# Patient Record
Sex: Female | Born: 1935 | Race: White | Hispanic: No | Smoking: Never smoker
Health system: Southern US, Community
[De-identification: ages and names within clinical notes are randomized; demographics above are authoritative.]

## PROBLEM LIST (undated history)

## (undated) DIAGNOSIS — E7800 Pure hypercholesterolemia, unspecified: Secondary | ICD-10-CM

## (undated) DIAGNOSIS — B958 Unspecified staphylococcus as the cause of diseases classified elsewhere: Secondary | ICD-10-CM

## (undated) DIAGNOSIS — N183 Chronic kidney disease, stage 3 unspecified: Secondary | ICD-10-CM

## (undated) DIAGNOSIS — N289 Disorder of kidney and ureter, unspecified: Secondary | ICD-10-CM

## (undated) DIAGNOSIS — K922 Gastrointestinal hemorrhage, unspecified: Secondary | ICD-10-CM

## (undated) DIAGNOSIS — I10 Essential (primary) hypertension: Secondary | ICD-10-CM

## (undated) DIAGNOSIS — M4800 Spinal stenosis, site unspecified: Secondary | ICD-10-CM

## (undated) DIAGNOSIS — I341 Nonrheumatic mitral (valve) prolapse: Secondary | ICD-10-CM

## (undated) DIAGNOSIS — F0390 Unspecified dementia without behavioral disturbance: Secondary | ICD-10-CM

## (undated) DIAGNOSIS — K219 Gastro-esophageal reflux disease without esophagitis: Secondary | ICD-10-CM

## (undated) HISTORY — PX: CATARACT EXTRACTION: SUR2

## (undated) HISTORY — DX: Nonrheumatic mitral (valve) prolapse: I34.1

## (undated) HISTORY — PX: OTHER SURGICAL HISTORY: SHX169

## (undated) HISTORY — DX: Gastrointestinal hemorrhage, unspecified: K92.2

## (undated) HISTORY — DX: Gastro-esophageal reflux disease without esophagitis: K21.9

## (undated) HISTORY — DX: Essential (primary) hypertension: I10

## (undated) HISTORY — DX: Chronic kidney disease, stage 3 (moderate): N18.3

## (undated) HISTORY — DX: Pure hypercholesterolemia, unspecified: E78.00

## (undated) HISTORY — DX: Spinal stenosis, site unspecified: M48.00

## (undated) HISTORY — DX: Chronic kidney disease, stage 3 unspecified: N18.30

## (undated) HISTORY — PX: ABDOMINAL HYSTERECTOMY: SHX81

## (undated) HISTORY — DX: Unspecified dementia, unspecified severity, without behavioral disturbance, psychotic disturbance, mood disturbance, and anxiety: F03.90

## (undated) HISTORY — DX: Unspecified staphylococcus as the cause of diseases classified elsewhere: B95.8

## (undated) HISTORY — PX: ABCESS DRAINAGE: SHX399

## (undated) HISTORY — PX: TOTAL KNEE ARTHROPLASTY: SHX125

---

## 1998-05-31 ENCOUNTER — Encounter: Admission: RE | Admit: 1998-05-31 | Discharge: 1998-07-14 | Payer: Self-pay | Admitting: Rheumatology

## 1998-10-12 ENCOUNTER — Ambulatory Visit (HOSPITAL_COMMUNITY): Admission: RE | Admit: 1998-10-12 | Discharge: 1998-10-12 | Payer: Self-pay | Admitting: Rheumatology

## 1998-10-12 ENCOUNTER — Encounter: Payer: Self-pay | Admitting: Rheumatology

## 1999-02-07 ENCOUNTER — Encounter: Admission: RE | Admit: 1999-02-07 | Discharge: 1999-02-07 | Payer: Self-pay | Admitting: Rheumatology

## 1999-02-07 ENCOUNTER — Encounter: Payer: Self-pay | Admitting: Rheumatology

## 1999-02-24 ENCOUNTER — Encounter: Payer: Self-pay | Admitting: Rheumatology

## 1999-02-24 ENCOUNTER — Ambulatory Visit (HOSPITAL_COMMUNITY): Admission: RE | Admit: 1999-02-24 | Discharge: 1999-02-24 | Payer: Self-pay | Admitting: Rheumatology

## 1999-03-30 ENCOUNTER — Encounter: Payer: Self-pay | Admitting: Rheumatology

## 1999-03-30 ENCOUNTER — Ambulatory Visit (HOSPITAL_COMMUNITY): Admission: RE | Admit: 1999-03-30 | Discharge: 1999-03-30 | Payer: Self-pay | Admitting: Rheumatology

## 1999-05-26 ENCOUNTER — Encounter: Admission: RE | Admit: 1999-05-26 | Discharge: 1999-05-26 | Payer: Self-pay | Admitting: Internal Medicine

## 1999-05-26 ENCOUNTER — Encounter: Payer: Self-pay | Admitting: Internal Medicine

## 1999-05-31 ENCOUNTER — Encounter: Payer: Self-pay | Admitting: Internal Medicine

## 1999-05-31 ENCOUNTER — Encounter: Admission: RE | Admit: 1999-05-31 | Discharge: 1999-05-31 | Payer: Self-pay | Admitting: Internal Medicine

## 1999-08-24 ENCOUNTER — Encounter: Payer: Self-pay | Admitting: Orthopedic Surgery

## 1999-08-28 ENCOUNTER — Encounter: Payer: Self-pay | Admitting: Orthopedic Surgery

## 1999-08-28 ENCOUNTER — Inpatient Hospital Stay (HOSPITAL_COMMUNITY): Admission: RE | Admit: 1999-08-28 | Discharge: 1999-08-31 | Payer: Self-pay | Admitting: Orthopedic Surgery

## 1999-11-01 ENCOUNTER — Encounter: Payer: Self-pay | Admitting: Orthopedic Surgery

## 1999-11-01 ENCOUNTER — Inpatient Hospital Stay (HOSPITAL_COMMUNITY): Admission: RE | Admit: 1999-11-01 | Discharge: 1999-11-05 | Payer: Self-pay | Admitting: Orthopedic Surgery

## 1999-11-28 ENCOUNTER — Encounter: Admission: RE | Admit: 1999-11-28 | Discharge: 1999-11-28 | Payer: Self-pay | Admitting: Family Medicine

## 1999-11-28 ENCOUNTER — Encounter: Payer: Self-pay | Admitting: Family Medicine

## 1999-12-12 ENCOUNTER — Encounter: Payer: Self-pay | Admitting: Orthopedic Surgery

## 1999-12-12 ENCOUNTER — Inpatient Hospital Stay (HOSPITAL_COMMUNITY): Admission: RE | Admit: 1999-12-12 | Discharge: 1999-12-14 | Payer: Self-pay | Admitting: Orthopedic Surgery

## 2000-01-02 ENCOUNTER — Ambulatory Visit (HOSPITAL_BASED_OUTPATIENT_CLINIC_OR_DEPARTMENT_OTHER): Admission: RE | Admit: 2000-01-02 | Discharge: 2000-01-02 | Payer: Self-pay | Admitting: Orthopedic Surgery

## 2000-04-12 ENCOUNTER — Other Ambulatory Visit: Admission: RE | Admit: 2000-04-12 | Discharge: 2000-04-12 | Payer: Self-pay | Admitting: Obstetrics and Gynecology

## 2000-04-15 ENCOUNTER — Other Ambulatory Visit: Admission: RE | Admit: 2000-04-15 | Discharge: 2000-04-15 | Payer: Self-pay | Admitting: Obstetrics and Gynecology

## 2000-04-15 ENCOUNTER — Encounter (INDEPENDENT_AMBULATORY_CARE_PROVIDER_SITE_OTHER): Payer: Self-pay

## 2000-05-08 ENCOUNTER — Other Ambulatory Visit: Admission: RE | Admit: 2000-05-08 | Discharge: 2000-05-08 | Payer: Self-pay | Admitting: Obstetrics and Gynecology

## 2000-05-08 ENCOUNTER — Encounter (INDEPENDENT_AMBULATORY_CARE_PROVIDER_SITE_OTHER): Payer: Self-pay | Admitting: Specialist

## 2000-07-01 ENCOUNTER — Encounter (INDEPENDENT_AMBULATORY_CARE_PROVIDER_SITE_OTHER): Payer: Self-pay | Admitting: Specialist

## 2000-07-01 ENCOUNTER — Ambulatory Visit (HOSPITAL_COMMUNITY): Admission: RE | Admit: 2000-07-01 | Discharge: 2000-07-01 | Payer: Self-pay | Admitting: Obstetrics and Gynecology

## 2001-04-30 ENCOUNTER — Ambulatory Visit (HOSPITAL_COMMUNITY): Admission: RE | Admit: 2001-04-30 | Discharge: 2001-04-30 | Payer: Self-pay | Admitting: Internal Medicine

## 2001-04-30 HISTORY — PX: COLONOSCOPY: SHX174

## 2001-05-28 ENCOUNTER — Other Ambulatory Visit: Admission: RE | Admit: 2001-05-28 | Discharge: 2001-05-28 | Payer: Self-pay | Admitting: Obstetrics and Gynecology

## 2002-10-21 ENCOUNTER — Encounter: Payer: Self-pay | Admitting: Family Medicine

## 2002-10-21 ENCOUNTER — Encounter: Admission: RE | Admit: 2002-10-21 | Discharge: 2002-10-21 | Payer: Self-pay | Admitting: Family Medicine

## 2003-12-27 ENCOUNTER — Encounter: Admission: RE | Admit: 2003-12-27 | Discharge: 2003-12-27 | Payer: Self-pay | Admitting: Family Medicine

## 2006-03-25 ENCOUNTER — Inpatient Hospital Stay (HOSPITAL_COMMUNITY): Admission: RE | Admit: 2006-03-25 | Discharge: 2006-03-28 | Payer: Self-pay | Admitting: Orthopedic Surgery

## 2007-02-28 ENCOUNTER — Ambulatory Visit: Payer: Self-pay | Admitting: Urgent Care

## 2007-02-28 ENCOUNTER — Inpatient Hospital Stay (HOSPITAL_COMMUNITY): Admission: AD | Admit: 2007-02-28 | Discharge: 2007-03-02 | Payer: Self-pay | Admitting: Gastroenterology

## 2007-03-01 ENCOUNTER — Encounter: Payer: Self-pay | Admitting: Gastroenterology

## 2007-03-01 ENCOUNTER — Ambulatory Visit: Payer: Self-pay | Admitting: Gastroenterology

## 2007-03-01 HISTORY — PX: ESOPHAGOGASTRODUODENOSCOPY: SHX1529

## 2007-03-02 ENCOUNTER — Ambulatory Visit: Payer: Self-pay | Admitting: Gastroenterology

## 2007-03-02 HISTORY — PX: COLONOSCOPY: SHX174

## 2007-04-29 ENCOUNTER — Ambulatory Visit: Payer: Self-pay | Admitting: Gastroenterology

## 2007-06-10 ENCOUNTER — Ambulatory Visit (HOSPITAL_COMMUNITY): Admission: RE | Admit: 2007-06-10 | Discharge: 2007-06-10 | Payer: Self-pay | Admitting: Gastroenterology

## 2007-08-04 ENCOUNTER — Ambulatory Visit: Payer: Self-pay | Admitting: Gastroenterology

## 2007-08-14 ENCOUNTER — Emergency Department (HOSPITAL_COMMUNITY): Admission: EM | Admit: 2007-08-14 | Discharge: 2007-08-14 | Payer: Self-pay | Admitting: Emergency Medicine

## 2007-09-02 ENCOUNTER — Emergency Department (HOSPITAL_COMMUNITY): Admission: EM | Admit: 2007-09-02 | Discharge: 2007-09-02 | Payer: Self-pay | Admitting: Emergency Medicine

## 2008-01-30 ENCOUNTER — Ambulatory Visit: Payer: Self-pay | Admitting: Internal Medicine

## 2008-07-02 ENCOUNTER — Encounter (INDEPENDENT_AMBULATORY_CARE_PROVIDER_SITE_OTHER): Payer: Self-pay | Admitting: *Deleted

## 2008-08-13 ENCOUNTER — Ambulatory Visit: Payer: Self-pay | Admitting: Gastroenterology

## 2008-08-13 DIAGNOSIS — Z8719 Personal history of other diseases of the digestive system: Secondary | ICD-10-CM

## 2009-06-25 ENCOUNTER — Emergency Department (HOSPITAL_COMMUNITY): Admission: EM | Admit: 2009-06-25 | Discharge: 2009-06-25 | Payer: Self-pay | Admitting: Emergency Medicine

## 2009-07-18 ENCOUNTER — Encounter (INDEPENDENT_AMBULATORY_CARE_PROVIDER_SITE_OTHER): Payer: Self-pay | Admitting: *Deleted

## 2009-07-21 ENCOUNTER — Encounter (HOSPITAL_BASED_OUTPATIENT_CLINIC_OR_DEPARTMENT_OTHER): Admission: RE | Admit: 2009-07-21 | Discharge: 2009-09-16 | Payer: Self-pay | Admitting: Internal Medicine

## 2009-09-23 DIAGNOSIS — K219 Gastro-esophageal reflux disease without esophagitis: Secondary | ICD-10-CM

## 2009-09-23 DIAGNOSIS — N259 Disorder resulting from impaired renal tubular function, unspecified: Secondary | ICD-10-CM | POA: Insufficient documentation

## 2009-10-10 ENCOUNTER — Encounter: Admission: RE | Admit: 2009-10-10 | Discharge: 2009-10-10 | Payer: Self-pay | Admitting: Family Medicine

## 2009-10-19 ENCOUNTER — Ambulatory Visit: Payer: Self-pay | Admitting: Gastroenterology

## 2010-04-18 NOTE — Assessment & Plan Note (Signed)
Summary: FU IN ONE YEAR,PUD/SS   Allergies (verified): 1)  ! Demerol  Vital Signs:  Patient profile:   77 year old female Height:      63 inches Weight:      165 pounds BMI:     29.33 Temp:     98.7 degrees F oral Pulse rate:   72 / minute BP sitting:   122 / 78  (left arm) Cuff size:   regular  Vitals Entered By: Cloria Spring LPN (October 19, 2009 4:23 PM)

## 2010-04-18 NOTE — Letter (Signed)
Summary: Recall Office Visit  [facility]  [address]   [place], [place] [address]   Phone: [phone]  Fax: [phone]      Jul 18, 2009   [facility] [place] [address] [place], [place]  [address] [birth year]   Dear Ms. Fedele,   According to our records, it is time for you to schedule a follow-up office visit with [place].   At your convenience, please call [phone] to schedule an office visit. If you have any questions, concerns, or feel that this letter is in error, we would appreciate your call.   Sincerely,    Diana Eves  [facility] Ph: [phone]   Fax: [phone]

## 2010-04-18 NOTE — Assessment & Plan Note (Signed)
Summary: PUD   Visit Type:  Follow-up Visit Primary Care Provider:  Dr. Gilmore Laroche, M.D.  Chief Complaint:  PUD.  History of Present Illness: No questions or concerns. Rare taking baby ASA: 3 nights a week. No more Celebrex. No black stools, rectal bleeding, problems swallowing, nausea, vomiting, or abd pain. Lost 3 lbs since last year-trying.  Current Medications (verified): 1)  Lotrel 10-20 Mg Caps (Amlodipine Besy-Benazepril Hcl) .Marland Kitchen.. 1 By Mouth Qd 2)  Pravachol 20 Mg Tabs (Pravastatin Sodium) .Marland Kitchen.. 1 By Mouth Daily 3)  Omeprazole 20 Mg Cpdr (Omeprazole) .Marland Kitchen.. 1 By Mouth Qd 4)  Celexa 10 Mg Tabs (Citalopram Hydrobromide) .Marland Kitchen.. 1 By Mouth Daily 5)  Fish Oil  Oil (Fish Oil) .... 3 By Mouth Qd 6)  Calcium Plus D .... Daily 7)  Multi Vitamin .... Take 1 Tablet By Mouth Once A Day 8)  Vitamin D .... Once Daily  Allergies (verified): 1)  ! Demerol  Past History:  Past Medical History: Peptic Ulcer Disease on CLBX/ASA-12/08-EGD TCS: 12/08-no polyps Diverticulosis, [place] chronic constipation hemorrhoids GERD Hyperlipidemia Hypertension Chronic Kidney Disease: Cr 1.30 8/09  Mtral valve prolapse  Past Surgical History: No recent surgery.  Family History: No FH of Colon Cancer  Social History: Widowed. Lives with son. Retired Diplomatic Services operational officer  Review of Systems       2008: 158 lbs  JULY 2011 CR 1.36 ALB 4.4 NL HFP HB 13.8 PLT 332 CHOL 184 TRIG 86 TSH 3.550  Physical Exam  General:  Well developed, well nourished, no acute distress. Head:  Normocephalic and atraumatic. Lungs:  Clear throughout to auscultation. Heart:  Regular rate and rhythm; no murmurs. Abdomen:  Soft, nontender and nondistended. Normal bowel sounds. Extremities:  No edema noted.  Impression & Recommendations:  Problem # 1:  GASTROINTESTINAL HEMORRHAGE, HX OF (ICD-V12.79) On ASA TIWK. No active GIB. OPV in 24 mos.  CC: PCP  Other Orders: Est. Patient Level II (54098)  Appended Document: PUD 24  MONTH OPV IS IN THE COMPUTER

## 2010-05-16 ENCOUNTER — Emergency Department (HOSPITAL_COMMUNITY)
Admission: EM | Admit: 2010-05-16 | Discharge: 2010-05-16 | Disposition: A | Discharge status: Home / Self Care | Payer: Medicare Other | Attending: Emergency Medicine | Admitting: Emergency Medicine

## 2010-05-16 DIAGNOSIS — R059 Cough, unspecified: Secondary | ICD-10-CM | POA: Insufficient documentation

## 2010-05-16 DIAGNOSIS — R0602 Shortness of breath: Secondary | ICD-10-CM | POA: Insufficient documentation

## 2010-05-16 DIAGNOSIS — R05 Cough: Secondary | ICD-10-CM | POA: Insufficient documentation

## 2010-05-16 DIAGNOSIS — J45901 Unspecified asthma with (acute) exacerbation: Secondary | ICD-10-CM | POA: Insufficient documentation

## 2010-05-16 DIAGNOSIS — R0789 Other chest pain: Secondary | ICD-10-CM | POA: Insufficient documentation

## 2010-06-07 LAB — CBC
MCHC: 34.8 g/dL (ref 30.0–36.0)
MCV: 85.4 fL (ref 78.0–100.0)
WBC: 7.7 10*3/uL (ref 4.0–10.5)

## 2010-06-07 LAB — BASIC METABOLIC PANEL
CO2: 27 mEq/L (ref 19–32)
Calcium: 9.5 mg/dL (ref 8.4–10.5)
GFR calc non Af Amer: 42 mL/min — ABNORMAL LOW (ref >60)
Glucose, Bld: 107 mg/dL — ABNORMAL HIGH (ref 70–99)

## 2010-06-07 LAB — DIFFERENTIAL
Basophils Absolute: 0 10*3/uL (ref 0.0–0.1)
Basophils Relative: 0 % (ref 0–1)
Eosinophils Absolute: 0.5 10*3/uL (ref 0.0–0.7)

## 2010-08-01 NOTE — Assessment & Plan Note (Signed)
NAMEMarland Kitchen  [name], [name]               CHART#:  [id number]   DATE:  04/29/2007                       DOB:  [birth year]   REFERRING PHYSICIAN:  [facility].   PROBLEM LIST:  1. Melena secondary to peptic ulcer disease, although definite disease      was not identified on upper endoscopy in December 2008.  2. Constipation.  3. Hypertension.  4. Hyperlipidemia.  5. Allergies.  6. Mitral valve prolapse.  7. Allergy to DEMEROL.  8. Sigmoid colon diverticulosis.  9. 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   SUBJECTIVE:  [name] is a 77 year old female who presents as a  return patient visit.  She has no questions, concerns, or complaints.  She had labs drawn a week ago.  She has been taking her omeprazole and  avoiding aspirin.  She takes her Celebrex sparingly.  She has never  had a history of stroke, heart attack, or tobacco use.  Her father had a  heart attack at 80 and died of a stroke at 46.  He smoked a pipe.  Her  mother died in 47 of old age.   MEDICATIONS:  1. Lotrel.  2. Singulair.  3. Allegra.  4. Glucosamine twice a day.  5. Multivitamin.  6. Celebrex as needed.  7. Metamucil.  8. Fish oil.  9. Pravastatin.  10.Meclizine as needed.  11.Omeprazole daily.  12.Iron twice a day.   OBJECTIVE:  VITAL SIGNS:  Weight 165 pounds (up 7 pounds since December  2008), height 5 feet 3 inches, BMI 29.2 (overweight), temperature 97.7,  blood pressure 128/80, pulse 88.GENERAL:  She is in no apparent  distress, alert and oriented x4.  LUNGS:  Clear to auscultation bilaterally.CARDIOVASCULAR:  Regular  rhythm.ABDOMEN:  Bowel sounds are present, soft, nontender,  nondistended.   ASSESSMENT:  [name] is a 77 year old female who had a drop in her  hemoglobin from 10 to 5, likely secondary to peptic ulcer disease.  She  has been off of aspirin for the last 2 months.  She has no evidence of  gastrointestinal bleed.   Thank you for  allowing me to see [name] in consultation.  My  recommendations follow.   RECOMMENDATIONS:  1. She may resume using Celebrex.  She is cautioned to use aspirin      Monday, Wednesday, Friday, adding aspirin to Celebrex negates the      GI protective effects.  2. She should continue omeprazole indefinitely.  3. We will check her hemoglobin results from [facility]      [facility] and if her hemoglobin is normal, then the iron will be      discontinued.  4. She should follow up with me in 3 months.       Kassie Mends, M.D.  Electronically Signed     SM/MEDQ  D:  04/30/2007  T:  05/01/2007  Job:  [id number]   cc:   Ernestina Penna, M.D.

## 2010-08-01 NOTE — Consult Note (Signed)
NAMECRYSTLE, [name]              ACCOUNT NO.:  [id number]   MEDICAL RECORD NO.:  [id number]         PATIENT TYPE:  INP   LOCATION:  A323                          FACILITY:  [facility]   PHYSICIAN:  Kassie Mends, M.D.      DATE OF BIRTH:  [birth year]   DATE OF CONSULTATION:  DATE OF DISCHARGE:  03/02/2007                                 CONSULTATION   REFERRING PHYSICIAN:  Dr. Ernestina Penna.   REASON FOR CONSULTATION:  Melena.   HISTORY OF PRESENT ILLNESS:  [name] is a 77 year old Caucasian  female.  Approximately 1 week ago, she developed a large melenic stool.  She has complained of fatigue and weakness to the point where she is  having difficulty walking.  She denies any syncope, chest pain,  shortness of breath, or headaches.  She has had several melenic stools  over the past week.  She is having a bowel movement about every day or  every other day.  She denies any abdominal pain.  She has had some  heartburn and indigestion.  She complains of anorexia.  She does take  Nexium p.r.n., but has not been taking it on a regular basis.  She  denies any dysphagia or odynophagia.  She is on Celebrex and was  previously on a baby aspirin, although she stopped this when she started  bleeding.  She was seen at [facility].  She was found  to have a hemoglobin of 10.1 and was Hemoccult-positive.  She has a  hematocrit of 29.1, MCV of 83, a red blood cell count of 3.49, a white  blood cell count of 11.4 and platelets of 444,000.  She had a normal  TSH.  Creatinine was 1.38.   PAST MEDICAL AND SURGICAL HISTORY:  1. She has a history of chronic constipation.  On colonoscopy by Dr.      Jena Gauss, April 30, 2001, she was found to have internal      hemorrhoids, normal colon, normal terminal ileum.  2. She has history of hypertension, hypercholesterolemia, seasonal      allergies and arthritis and mitral valve prolapse.  3. She had bilateral hip replacements in 2001.  4.  She had a right knee replacement in January 2008.  5. At age 75, she had an appendectomy.  6. She had a precancerous skin lesion removed by Dr. Margo Aye earlier this      year.   CURRENT MEDICATIONS:  1. Lotrel 10 mg daily.  2. Singulair 10 mg p.r.n.  3. Allegra 180 mg p.r.n.  4. Biotin once daily.  5. Zinc once daily.  6. Vitamin B12 once daily.  7. Vitamin C 500 mg b.i.d.  8. Glucosamine b.i.d.  9. Multivitamin daily.  10.Celebrex 100 mg daily.  11.Calcium plus vitamin D 600 mg b.i.d.  12.Metamucil p.r.n.  13.Fish oil b.i.d.  14.Pravastatin once daily.  15.Meclizine 25 mg p.r.n.  16.Nexium 40 mg p.r.n.  17.Aspirin 81 mg nightly, which was recently discontinued.   ALLERGIES:  DEMEROL.   FAMILY HISTORY:  There is no known family history of colorectal  carcinoma, liver  or chronic GI problems.  Mother deceased at age 41 due  to old age.  Father deceased at age 4 with history of coronary artery  disease and CVA.  She has multiple siblings with history significant for  coronary artery disease, hypertension, aneurysm and neck cancer.   SOCIAL HISTORY:  [name] is widowed.  She lives with her son.  She  has 3 grown healthy children.  She is a retired [employer] Services operational officer.  She denies  any tobacco, alcohol or drug use.   REVIEW OF SYSTEMS:  See HPI, otherwise negative.   PHYSICAL EXAMINATION:  VITAL SIGNS:  Weight 158 pounds, height 62  inches.  Temperature 98.2, blood pressure 120/60 and pulse of 80.  GENERAL:  [name] is a pale-appearing Caucasian female who is  alert, oriented, pleasant and cooperative, in no acute distress.  HEENT:  Sclerae are clear, anicteric.  Conjunctivae pale.  Oropharynx  pink and moist without any lesions.  She has upper and lower dentures  intact.  NECK:  Supple without any mass or thyromegaly.  CHEST:  Heart:  Regular rate and rhythm.  She has a 2/6 murmur noted.  LUNGS:  Clear to auscultation bilaterally.  ABDOMEN:  Positive bowel sounds x4.  No  bruits auscultated.  Soft,  nontender and non-distended without palpable mass or hepatosplenomegaly.  No rebound tenderness or guarding.  BACK:  Noted and erythematous, scaled brown lesion in her mid back.  RECTAL:  She has a hypopigmented perineum with loss of normal  vasculature.  Internal exam is benign.  She has formed stool in the  vault, which is dark and Hemoccult-positive.  EXTREMITIES:  Without edema or clubbing bilaterally.   LABORATORY STUDIES:  Laboratory studies from [facility] on February 25, 2007 showed a BUN of 37, creatinine 1.38, sodium  137, potassium 4.2, chloride 100, CO2 24, calcium 9.6, total protein  6.2, albumin 4, total bilirubin 0.2, alkaline phosphatase 77, AST 22,  ALT 32.   IMPRESSION:  [name] is a 77 year old female with a 1-week history  of melena and gastrointestinal bleeding.  I suspect she may have peptic  ulcer disease.  She is at risk, given concomitant aspirin and Celebrex  use.  Other possibilities include small bowel etiology including  arteriovenous malformations or less likely would be colonic source.  She  does have an abnormal rectal exam and hypopigmented perineum and may  need rectal exam plus/minus colonoscopy, depending on  esophagogastroduodenoscopy findings.   PLAN:  1. EGD with Dr. Jena Gauss as soon as possible.  I have discussed this      procedure including risks and benefits including, but not limited      to, bleeding, infection, perforation and drug reaction; she agrees      to the plan and consent will be obtained.  2. STAT CBC.  The lab is to hold her and call me with the results.  3. She is instructed to go immediately to the emergency room if she      develops significant bleeding or weakness.   We would like to thank [facility] for allowing [place] to  participate in the care of Ms. Polivka.      Lorenza Burton, N.P.      Kassie Mends, M.D.  Electronically Signed    KJ/MEDQ   D:  02/28/2007  T:  02/28/2007  Job:  [id number]   cc:   [facility] Stacey Drain. NP   Ernestina Penna,  M.D.  Fax: [phone]

## 2010-08-01 NOTE — Op Note (Signed)
NAMELOWEN, MANSOURI              ACCOUNT NO.:  [id number]   MEDICAL RECORD NO.:  [id number]          PATIENT TYPE:  INP   LOCATION:  A323                          FACILITY:  [facility]   PHYSICIAN:  Kassie Mends, M.D.      DATE OF BIRTH:  04/13/1932   DATE OF PROCEDURE:  03/02/2007  DATE OF DISCHARGE:                               OPERATIVE REPORT   PROCEDURE:  Colonoscopy.   INDICATION FOR EXAM:  Ms. Brusseau is a 77 year old female who had  melena for one week.  She is on aspirin and Celebrex.  Her hemoglobin  dropped 10 to 5.2.  Her last colonoscopy was on February 2003.  Her  upper endoscopy did reveal edema and stricture of the D1-D2 junction  suggesting ulcer disease but no definite ulcer was identified.  The  colonoscopy is being performed to rule out a right-sided colonic lesion  as an etiology for her melena.   FINDINGS:  1. Many sigmoid colon diverticula.  Otherwise no polyps, masses,      inflammatory changes or arteriovenous malformations.  2. Normal terminal ileum.  Approximately 10-15 cm of the distal      terminal ileum visualized.  No old blood or fresh blood seen in the      distal terminal ileum for in the colon.  3. Normal retroflexed view of the rectum.   RECOMMENDATIONS:  1. No aspirin and NSAIDs for 30 days.  No anticoagulation for 7 days.  2. Will await the biopsies from the upper endoscopy.  3. She should have a follow-up appointment with me in two months.  May      consider restarting the aspirin after one month as long as she is      on a proton pump inhibitor.  4. She should continue proton pump inhibitor twice daily for two weeks      then once daily.  5. Nu-Iron 150 mg twice daily.   MEDICATIONS:  1. Fentanyl 50 mcg IV.  2. Versed 5 mg IV.   PROCEDURE TECHNIQUE:  Physical exam was performed.  Informed consent was  obtained from the patient after explaining benefits, risks and  alternatives to procedure.  The patient connected to monitor and placed  in left lateral position.  Continuous oxygen was provided by nasal  cannula and IV medicine administered through an indwelling cannula.  After administration of sedation and rectal exam, the patient's rectum  was intubated.  The scope was advanced under direct visualization to the  distal terminal ileum.  The scope was removed slowly by carefully  examine the color, texture, anatomy and integrity of the mucosa on the  way out.  The patient was recovered in endoscopy and discharged to the  floor in satisfactory condition.  The findings of colonoscopy were  discussed with her brothers and sisters.      Kassie Mends, M.D.  Electronically Signed     SM/MEDQ  D:  03/02/2007  T:  03/03/2007  Job:  [id number]   cc:   Ernestina Penna, M.D.  Fax: [phone]

## 2010-08-01 NOTE — Assessment & Plan Note (Signed)
NAMEMarland Kitchen  CHASTA, DESHPANDE               CHART#:  [id number]   DATE:  01/30/2008                       DOB:  13-Jun-1932   PRIMARY CARE PHYSICIAN:  Ernestina Penna, MD   PROBLEM LIST:  1. Melena, most likely secondary to peptic ulcer disease, although      definitive disease was not identified on upper endoscopy December      2008.  2. Chronic constipation.  3. Hypertension.  4. Hyperlipidemia.  5. Allergies.  6. Mitral valve prolapse.  7. Chronic renal insufficiency.  8. Allergy to Demerol.  9. Sigmoid colon diverticulosis with last colonoscopy by Dr. Cira Servant on      March 02, 2007.   SUBJECTIVE:  The patient is a 77 year old Caucasian female.  She is  doing very well.  She continues to take aspirin on Monday, Wednesday,  and Friday.  She is not on iron anymore as her last hemoglobin was  normal.  She is taking omeprazole 20 mg daily.  She denies any  heartburn, ingestion, nausea, or vomiting.  Denies any anorexia.  She  denies any melena.  She occasionally has hard stools with some  straining.  She has had scant rectal bleeding noticed with wiping after  her stool on the toilet paper in trivial amounts.   CURRENT MEDICATIONS:  See the list from January 30, 2008.   ALLERGIES:  Demerol.   PHYSICAL EXAMINATION:  VITAL SIGNS:  Weight 166 pounds, height 69  inches, temperature 98.5, blood pressure 142/80, and pulse 80.  GENERAL:  She is well-developed, well-nourished Caucasian female in no  acute distress.  HEENT:  Sclerae clear, nonicteric.  Conjunctivae pink.  Oropharynx moist  without any lesions.  CHEST:  Heart, regular rate and rhythm.  Normal S1 and S2.  ABDOMEN:  Positive bowel sounds x4.  No bruits auscultated.  Soft,  nontender, nondistended without palpable mass or hepatosplenomegaly.  No  tenderness, rebound, or guarding.  EXTREMITIES:  Without clubbing or edema.   ASSESSMENT:  1. History of melena and iron deficiency anemia, which has resolved.  2.  Gastroesophageal reflux disease.  3. Chronic constipation and sigmoid colon diverticulosis.  4. Hemorrhoids.   PLAN:  1. Anusol-HC Suppository one per rectum b.i.d., #20 with no refills.      Colace stool softeners 100-200 mg daily.  2. Increase her water intake given her constipation.  3. Add Benefiber and discontinue Metamucil.  4. She is going to have her lab work sent here from Dr. Kathi Der      office.       Lorenza Burton, N.P.  Electronically Signed     R. Roetta Sessions, M.D.  Electronically Signed    KJ/MEDQ  D:  01/30/2008  T:  01/31/2008  Job:  [id number]   cc:   Ernestina Penna, M.D.

## 2010-08-01 NOTE — Assessment & Plan Note (Signed)
NAMEMarland Kitchen  [name], [name]               CHART#:  [id number]   DATE:  08/04/2007                       DOB:  Aug 10, 1932   REFERRING PHYSICIAN:  Ernestina Penna, M.D.   PROBLEM LIST:  1. Melena most likely secondary to peptic ulcer disease, although      definitive disease identified on upper endoscopy in December 2008;      her exam was highly suggestive of disease. Narrowed at the junction      of the first and second portions of the duodenum.  2. Constipation.  3. Hypertension.  4. Hyperlipidemia.  5. Allergies.  6. Mitral valve prolapse.  7. Allergy to Demerol.  8. Sigmoid colon diverticulosis.   SUBJECTIVE:  [name] is a 77 year old female who presents as a  return patient visit.  She denies any blood in her stool.  She has black  formed stools with iron.  She denies any weight loss.  She is currently  using Celebrex daily and aspirin every Monday, Wednesday, Friday.  She  still continues on iron daily.  Her last hemoglobin was 12.5, with a  TIBC of 352, ferritin of 17, and a creatinine 1.18 in March of 2009.  She has not had any labs drawn since March of 2009.   MEDICATIONS:  Lotrel, Singulair, Allegra, Biaxin, zinc, B12, vitamin C,  glucosamine, multivitamin, Celebrex daily, calcium with vitamin D,  Metamucil as needed, fish oil, pravastatin daily, meclizine as needed,  omeprazole daily, aspirin Monday, Wednesday, and Friday, iron daily,  Benicar 40 mg daily.   OBJECTIVE:  VITAL SIGNS:  Weight 166 pounds (unchanged since February  2009), height 5 feet 3 inches, temperature 98.6, blood pressure 128/80,  pulse 80.  GENERAL:  She is no apparent distress.  Alert and oriented x4. LUNGS:  Clear to auscultation bilaterally.  CARDIOVASCULAR:  Regular rhythm.  No  murmur.  ABDOMEN:  Bowel sounds present.  Soft, nontender, nondistended.   ASSESSMENT:  [name] is a 77 year old female who had melena and  blood secondary to peptic ulcer disease.  She currently has no  evidence  of active bleeding.  Her last colonoscopy was in February 2003.  Thank  you for allowing me to see [name] in consultation.  My  recommendations follow.   RECOMMENDATIONS:  1. She was given a prescription so that she can have a hemoglobin,      hematocrit, ferritin, and TIBC in  2. She is to stop iron.  3. She should continue omeprazole daily while taking aspirin and      Celebrex.  Aspirin and Celebrex together negates the GI protective      effects of Celebrex.  4. Return patient visit in 6 months.  5. Screening colonoscopy needs to be in 2018 not in 2013.       Kassie Mends, M.D.  Electronically Signed     SM/MEDQ  D:  08/04/2007  T:  08/04/2007  Job:  [id number]   cc:   Ernestina Penna, M.D.

## 2010-08-01 NOTE — H&P (Signed)
[name], [name]              ACCOUNT NO.:  [id number]   MEDICAL RECORD NO.:  [id number]          PATIENT TYPE:  INP   LOCATION:  A323                          FACILITY:  [facility]   PHYSICIAN:  Kassie Mends, M.D.      DATE OF BIRTH:  [birth year]   DATE OF ADMISSION:  02/28/2007  DATE OF DISCHARGE:  LH                              HISTORY & PHYSICAL   ADDENDUM   Ms. Swallows went to [facility] to have stat CBC obtained.  Her  hemoglobin has dropped from 10.1 on February 25, 2007, to 5.8 today.  She  was instructed to go immediately to [facility] for direct  admission under the discretion of Dr. Cira Servant.  She will be started on  normal saline 125 mL an hour.  We will type and cross for 4 units.  Give  2 units of packed RBCs today and premedicate with Tylenol and Benadryl.  Will obtain consent for an EGD by Dr. Cira Servant tomorrow and consent for  blood products.  She will begin Protonix 40 mg daily IV daily and  continue Lotrel 10 mg daily as well as a multivitamin daily.  She is  going to have a CBC on admission and a CBC after her second unit of  blood.  She will be on a clear liquid diet and n.p.o. after midnight.  She can be up to the bathroom with assistance only.  She may require a  third unit of blood tomorrow.  This case has been discussed with Dr.  Kassie Mends who will be in charge of her care over the weekend.      Lorenza Burton, N.P.      Kassie Mends, M.D.  Electronically Signed    KJ/MEDQ  D:  02/28/2007  T:  02/28/2007  Job:  [id number]   cc:   [facility]

## 2010-08-01 NOTE — Op Note (Signed)
NAMEHAVEN, PYLANT              ACCOUNT NO.:  [id number]   MEDICAL RECORD NO.:  [id number]          PATIENT TYPE:  INP   LOCATION:  A323                          FACILITY:  [facility]   PHYSICIAN:  Kassie Mends, M.D.      DATE OF BIRTH:  22-Dec-1932   DATE OF PROCEDURE:  03/01/2007  DATE OF DISCHARGE:                               OPERATIVE REPORT   PROCEDURE:  Esophagogastroduodenoscopy with cold forceps biopsy.   INDICATIONS FOR PROCEDURE:  Ms. Leidner is a 77 year old female who  presents with melena for 1 week.  She takes aspirin and Celebrex.  Her  hemoglobin dropped from 10 to 5.2.  Her last colonoscopy was February  2003.   FINDINGS:  1. Normal esophagus without evidence of Barrett's, mass, erosion,      ulceration or stricture.  2. Mild erythema in the antrum.  Distorted pylorus consistent with      prior ulcer disease.  Biopsies obtained via cold forceps to      evaluate for Helicobacter pylori gastritis.  3. Junction of D1 and D2 erythematous, edematous, and strictured.  The      lumen was narrowed to approximately 10 mm.  The scope passed with      mild resistance.  The second portion of the duodenum was normal.      Biopsies were obtained from the strictured area to evaluate for any      evidence of duodenal adenoma or mass.   DIAGNOSIS:  No definite ulcer visualized.  The erythema, edema, and the  stricturing at the juncture of D1 and D2 suggests ulcer disease as the  most likely the etiology for her melena.   RECOMMENDATIONS:  1. Will await biopsies.  2. She should aspirin and anti-inflammatory drugs for 30 days.  No      anticoagulation for 7 days.  3. Will have a colonoscopy tomorrow to definitively rule out a right      lesion as an etiology for her melena due to a profound drop in her      hemoglobin.  4. Proton pump inhibitor b.i.d.  5. Serial hemoglobin and hematocrit.   MEDICATIONS:  1. Fentanyl 50 mcg IV.  2. Versed 5 mg IV.   PROCEDURE TECHNIQUE:   Physical exam was performed.  Informed consent was  obtained from the patient after explaining the benefits, risks and  alternatives of the procedure.  The patient was connected to the monitor  and placed in left lateral position.  Continuous oxygen was provided by  nasal cannula and IV medicine administered through an indwelling  cannula.  After administration of sedation the patient's esophagus was  intubated and the scope was  advanced under direct visualization to the second portion of the  duodenum.  The scope was removed slowly by carefully examining the  color, texture, anatomy and integrity of the mucosa on the way out.  The  patient was recovered in endoscopy and discharged to the floor in  satisfactory condition.      Kassie Mends, M.D.  Electronically Signed     SM/MEDQ  D:  03/02/2007  T:  03/02/2007  Job:  [id number]   cc:   Ernestina Penna, M.D.  Fax: [phone]

## 2010-08-04 NOTE — Op Note (Signed)
[place]  Patient:    [name], [name]                     MRN: [id number] Proc. Date: 08/28/99 Adm. Date:  [id number] Attending:  Twana First                           Operative Report  PREOPERATIVE DIAGNOSIS:  Left hip degenerative joint disease.  POSTOPERATIVE DIAGNOSIS:  Left hip degenerative joint disease.  OPERATION:  Left total hip replacement using [employer] total hip system with acetabulum 50 mg PSL Pressfit cup with two locking screws and 10 degree polyethylene liner.  Femoral component #6 cemented component with Eon stem with +0 x 28 mm femoral head with #2 cement plug and 11 mm centralizer.  SURGEON:  Elana Alm. Thurston Hole, M.D.  ASSISTANT:  Kirstin Adelberger, P.A.  ANESTHESIA:  General  OPERATIVE TIME:  1 hour and 40 minutes.  ESTIMATED BLOOD LOSS: 400 cc  COMPLICATIONS:  None.  DESCRIPTION OF PROCEDURE:  Mrs. Eldridge is brought to the operating room on August 28, 1999 and placed on the operating table in the supine position. After an adequate level of general anesthesia was obtained.  Her left hip was examined under anesthesia, flexion to 95, extension to 0, internal and external rotation of 25 degrees.  Both legs were approximately equal in length.  Pulse of 2+ and symmetric. She had a Foley catheter placed under sterile conditions and received Ancef 1 gram IV preoperatively for prophylaxis.  She was then turned in the left lateral decubitus position, secured on the bed with a Mark frame.  Her left hip and leg were prepped using sterile Betadine and draped using sterile technique. Originally through a 20 cm posterior lateral greater trochanteric incision initial exposure was made.  The underlying subcutaneous tissues were incised in line with the skin incision. The iliotibial band and gluteus maximus fascia was incised longitudinally revealing the sciatic nerve which was carefully protected. The short external  rotators of the hip were released off their femoral neck insertion intact.  The femoral head was then posterior dislocated. She was found to have a large amount of synovitis which was thoroughly debrided.  She had grade IV changes in the femoral head.  A femoral neck cut was made 1.5 to 2 cm above the lesser trochanter in the appropriate amount of anteversion and inclination.  The acetabulum was exposed. Degenerative labrum was removed from around the acetabulum as well excess synovium.  At this point, sequential acetabular reamers were used to ream up to a #50 size followed by a 50 trial which was found to be an excellent fit. AFter this was done, the actual acetabular component was hammered into position in the appropriate amount of anteversion and abduction. Excellent fit was noted.  Two locking screws were placed one in the 12:00 and one in the 2:00 position.  After this was done, the 10 degree polyethylene liner was placed with the posterior lateral lip being put in this position. After this was done, the proximal femur was exposed.  Axial reamers were used to ream the femoral canal up to a #6 cement size followed by broaching to a number 6 cemented size. With the #6 cement brooch in place.  A +0 femoral head trial was placed.  The hip was reduced and taken through a range of motion and to be stable up to 70 degrees of internal  rotation in both neutral and 30 degrees of adduction and the left leg was found to be approximately 1.5 to 2 cm increased in length over the right due to the fact that there was significant collapse on this left and that the right side was also significantly collapsed and she would need to undergo a right total hip replacement in the future. I wanted to restore her normal femoral head geometry centered in the acetabulum and because of this, there was increased length added back to a normal position.  The trial components were dislocated.  The femoral canal was  sized for a cement plug.  A #2 was found to be the appropriate size.  This was placed down the femoral canal. The femoral canal was then jet lavaged irrigated with 3 liters of saline solution. Cement was then placed down the femoral canal with a pressurizer and then the actual prosthesis, #6 Eon stem was placed with an excellent fit and with access cement being removed from around the edges.  After the cement hardened, then the +0 x 28 mm femoral head was placed on to the femoral neck, hammered into position with an excellent morse taper fit.  It was then reduced, taken through a range of motion and found to be stable up to 70 degrees of internal rotation in both neutral and 30 degrees of adduction. After this was done, the tip was also tested for any anterior instability and there was no anterior instability in abduction and external rotation.  At this point, it was felt that all the components were of excellent size, fit and stability. The wound was thoroughly irrigated with antibiotic solution. The hip capsule and short external rotators of the hip were reattached to their femoral neck insertion through two drill holes in the greater trochanter. The iliotibial band and gluteus maximus fascia was reattached as well and resecured with #1 Panacryl suture. After this was done, then the subcutaneous tissues were closed with 0 and 2-0 Vicryl.  Skin was closed with skin staples. Sterile dressings were applied.  Hip abduction pillow was placed as well. After this was done, then the patient turned supine. She was awakened and taken to the recovery room in a stable condition. Needle and sponge counts were correct times two at the end of the case. DD:  08/28/99 TD:  08/30/99 Job: [id number]

## 2010-08-04 NOTE — Discharge Summary (Signed)
[name], [name]              ACCOUNT NO.:  [id number]   MEDICAL RECORD NO.:  [id number]          PATIENT TYPE:  INP   LOCATION:  1519                         FACILITY:  [facility]   PHYSICIAN:  Ollen Gross, M.D.    DATE OF BIRTH:  [birth year]   DATE OF ADMISSION:  03/25/2006  DATE OF DISCHARGE:  03/28/2006                               DISCHARGE SUMMARY   ADMITTING DIAGNOSES:  1. Osteoarthritis, right knee.  2. Hypercholesterolemia.  3. Hypertension.  4. Mitral valve prolapse.  5. Remote history of asthma.  6. Mild reflux.  7. History of postoperative wound infection, Staphylococcus.   DISCHARGE DIAGNOSES:  1. Osteoarthritis, right knee, status post right total knee      arthroplasty.  2. Hypercholesterolemia.  3. Hypertension.  4. Mitral valve prolapse.  5. Remote history of asthma.  6. Mild reflux.  7. History of postoperative wound infection, Staphylococcus.   PROCEDURE:  Right total knee, March 25, 2006, surgeon Dr. Lequita Halt,  assistant Avel Peace, PA-C.  Anesthesia general.  Tourniquet time 37  minutes.   CONSULTS:  None.   BRIEF HISTORY:  Ms. Meleski is a 77 year old female with end-stage  arthritis of the right knee with intractable pain, now presents for  total knee arthroplasty.   LABORATORY DATA:  Preop CBC:  Hemoglobin 13.2, hematocrit 38.2, white  cell count 10.6.  Postop hemoglobin 10.9, drifted down to 10.1, last  noted H&H 9.7 and 28.2.  PTT/PT preop 12.9 and 32, respectively.  INR  1.0.  Serial pro times followed.  Last noted PT/INR 21.3 and 1.8.  Chem  panel on admission:  Elevated BUN at 33, elevated creatinine 1.8,  elevated ALT of 57, remaining chem panel within normal limits.  Serial  BMETs were followed.  BUN and creatinine came down to normal levels of 8  and 1.03.  Electrolytes remained within normal limits.  Preop UA:  Small  leukocyte esterase, few epithelials, 3-6 white cells, 0-3 red cells with  few bacteria, small bili, trace ketones.   Blood group/type O positive.   EKG, March 17, 2006:  Normal sinus rhythm, left atrial enlargement.  When compared to June 28, 2000, left atrial enlargement now present  confirmed by Dr. [place] Bing.  Two-view chest March 18, 2006, no  acute chest disease.  Thoracolumbar scoliosis.   HOSPITAL COURSE:  The patient admitted to [facility],  tolerated the procedure well, later transferred to the recovery room and  orthopedic floor, started on PCA an p.o. medications for pain control  following surgery, given 24 hours postop antibiotics, started on  Coumadin for DVT prophylaxis, started back on her home medications.  Did  fairly well for the evening of surgery, seen on morning rounds, did have  some pain but doing pretty good.  Fluids were reduced.  She started  getting up out of bed with therapy.  By day 2, she was doing a little  bit better and actually got up and walked 150 feet and then later 250  feet.  Dressing was changed; incision looked excellent.  Discontinued  the PCA and the Foley.  Progressing  so well, meeting goals with therapy,  the patient was ready to go home by the following day of March 28, 2006.   DISCHARGE PLANNING:  1. The patient discharged home on March 28, 2006.  2. Discharge diagnoses, please see above.  3. Discharge medications:  Coumadin, Robaxin, Vicodin.  4. Diet:  Low cholesterol.  5. Activity:  Weightbearing as tolerated, home health physical      therapy, home health nursing, total knee protocol.  6. Followup 2 weeks.   DISPOSITION:  Home.   CONDITION ON DISCHARGE:  Improved.      Alexzandrew L. Julien Girt, P.A.      Ollen Gross, M.D.  Electronically Signed    ALP/MEDQ  D:  04/26/2006  T:  04/26/2006  Job:  [id number]   cc:   [facility]

## 2010-08-04 NOTE — Discharge Summary (Signed)
[place]. [facility]  Patient:    [name], [name]                     MRN: [id number] Adm. Date:  [id number] Disc. Date: [id number] Attending:  Twana First Dictator:   Kirstin A. Shepperson, P.A.                           Discharge Summary  ADMISSION DIAGNOSES: 1. End-stage degenerative joint disease right hip. 2. Hypertension. 3. Osteoarthritis. 4. Mitral valve prolapse.  DISCHARGE DIAGNOSES: 1. End-stage degenerative joint disease right hip, status post total hip    replacement. 2. Hypertension. 3. Osteoarthritis. 4. Mitral valve prolapse.  PROCEDURES IN HOUSE:  On November 01, 1999, the patient underwent a right total hip replacement by Molly Maduro A. Thurston Hole, M.D.  She tolerated the procedure well.  HOSPITAL COURSE:  The patient was admitted postoperatively.  On postoperative day #1, the patient progressed well.  Hemoglobin 9.4, INR 1.2.  Her potassium was low at 2.9, sodium was low at 131.  She was placed on K-Dur for hypokalemia and progressed in physical therapy.  On postoperative day #2, potassium still remained 2.9.  K-Dur was increased to 40 mEq twice a day. Hemoglobin was 8.3 and she was transfused with 2 units packed red blood cells with 20 of Lasix between units.  She still continued with physical therapy. On postoperative day #3, hemoglobin was 11.9 post transfusion. Potassium was up to 3.5 and sodium was 133. She continued to progress well in physical therapy.  On postoperative day #4, hypokalemia resolved.  Postoperative blood loss anemia was improving.  Hyponatremia was improving.  She was discharged to home in stable condition with home health physical therapy, home health occupational therapy and an R.N. for PT draws.  FOLLOW-UP:  I will see her back in the office in one week for staple removal. DD:  01/03/00 TD:  01/04/00 Job: [id number]

## 2010-08-04 NOTE — H&P (Signed)
[name], [name]              ACCOUNT NO.:  [id number]   MEDICAL RECORD NO.:  [id number]          PATIENT TYPE:  INP   LOCATION:  NA                           FACILITY:  [facility]   PHYSICIAN:  Ollen Gross, M.D.    DATE OF BIRTH:  05-23-1932   DATE OF ADMISSION:  03/25/2006  DATE OF DISCHARGE:                              HISTORY & PHYSICAL   DATE OF OFFICE VISIT HISTORY AND PHYSICAL:  March 21, 2006   CHIEF COMPLAINT:  Right knee pain.   HISTORY OF PRESENT ILLNESS:  The patient is a 77 year old female who has  been seen by Dr. Lequita Halt for a little over a year with history of  discomfort in her right knee, no specific injury, but has been  progressive in nature; it is hurting with all activities.  It is at a  point where she would like to have something done about it.  She was  seen in the office, where she was found to have bone-on-bone medial  compartment arthritis and about a 5- to 7-degree varus deformity, also  bone-on-bone of the patella, femoral region.  It is felt that she has  reached the point where she would benefit from undergoing a knee  replacement.  The risks and benefits have been discussed and she has  elected to proceed with surgery.   ALLERGIES:  No known drug allergies.   CURRENT MEDICATIONS:  Lotrel, Celebrex, Singulair, Tums, calcium and  vitamins.   PAST MEDICAL HISTORY:  1. Hypercholesterolemia.  2. Hypertension.  3. Mitral valve prolapse.  4. Remote history of asthma.  5. Mild reflux.  6. Past history of a wound infection following a previous right hip      surgery, which she states was a Staph infection.   PAST MEDICAL HISTORY:  1. Right breast lumpectomy, benign.  2. Left total hip replacement arthroplasty, June of 2001.  3. Right total hip replacement arthroplasty, August of 2001.  4. I&D, right hip, 3 weeks postop from her total hip, secondary to a      Staph infection (did not have to remove prosthesis).   FAMILY HISTORY:  Father  deceased at age 38 with heart attacks and  stroke.  Mother deceased at age 75 with asthma and migraines.   SOCIAL HISTORY:  Widowed, 3 children.  Denies use of tobacco products or  alcohol products.   REVIEW OF SYSTEMS:  GENERAL:  No fevers, chills or night sweats.  NEUROLOGIC:  No seizures, syncope or paralysis.  RESPIRATORY:  No  shortness of breath, productive cough or hemoptysis.  CARDIOVASCULAR:  No chest pain, angina or orthopnea.  GI:  No nausea, vomiting, diarrhea  or constipation.  GU:  No dysuria, hematuria or discharge.  MUSCULOSKELETAL:  Right knee.   PHYSICAL EXAM:  VITAL SIGNS:  Pulse 100, respirations 12, blood pressure  120/72.  GENERAL:  A 77 year old white female, well-nourished, well-developed, of  short stature, in no acute distress.  She is alert, oriented and  cooperative, very pleasant, a good historian.  She is accompanied by her  daughter.  HEENT:  Normocephalic, atraumatic.  Pupils  are round and reactive.  Oropharynx clear.  EOMs intact.  She does have a lower partial plate and  an upper plate.  NECK:  Supple.  LUNGS:  Clear.  HEART:  Regular rate and rhythm with a faint early systolic ejection  murmur noted, S1 and S2 noted.  ABDOMEN:  Soft and nontender.  Bowel sounds present.  BREASTS AND GENITALIA:  Not done, not pertinent to present illness.  EXTREMITIES:  Right knee:  No effusion, slight varus deformity, range of  motion of 5-130.  Marked crepitus was noted.   IMPRESSION:  1. Osteoarthritis of right knee.  2. Hypercholesterolemia.  3. Hypertension.  4. Mitral valve prolapse.  5. Remote history of asthma.  6. Mild reflux.  7. Postop wound infection, Staph.   PLAN:  The patient is admitted to [facility] to undergo a  right total knee replacement arthroplasty.  Surgery will be performed by  Dr. Ollen Gross.      Alexzandrew L. Julien Girt, P.A.      Ollen Gross, M.D.  Electronically Signed    ALP/MEDQ  D:  03/24/2006  T:   03/25/2006  Job:  [id number]

## 2010-08-04 NOTE — Discharge Summary (Signed)
[name], [name]              ACCOUNT NO.:  [id number]   MEDICAL RECORD NO.:  [id number]          PATIENT TYPE:  INP   LOCATION:  A323                          FACILITY:  [facility]   PHYSICIAN:  Kassie Mends, M.D.      DATE OF BIRTH:  [birth year]   DATE OF ADMISSION:  02/28/2007  DATE OF DISCHARGE:  12/14/2008LH                               DISCHARGE SUMMARY   PRIMARY PHYSICIAN:  Ernestina Penna, M.D.   PRIMARY GASTROENTEROLOGIST:  Kassie Mends, M.D.   DISCHARGE DIAGNOSES:  1. Melena secondary to peptic ulcer disease.  2. 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  3. History of constipation.  4. Hypertension.  5. Hyperlipidemia.  6. Allergies.   HISTORY OF PRESENT ILLNESS:  [name] is a 77 year old female who  was admitted after having 1 week of melena.   HOSPITAL COURSE:  As an outpatient, [name] had a hemoglobin  checked.  It was 5.  It was rechecked on admission and was 5.2.  She  received 2 units of packed red blood cells, and her hemoglobin increased  to 8.3 to 8.6.  She had no further evidence of black tarry stools.  An  upper endoscopy was performed which revealed mild erythema in the antrum  with a distorted pylorus consistent with prior ulcer disease.  Biopsies  were obtained to evaluate for H.  Pylori gastritis.  At the junction of  V1 and V2, there was erythema, edema and narrowing of the lumen to  approximately 10 mm.  Biopsies were taken of the strictured area.  Because of a question as to whether or not this lesion could account for  a 5 gram drop in her hemoglobin, a colonoscopy was performed on the next  day.  Colonoscopy revealed many sigmoid diverticula and a normal  terminal ileum.  She had no evidence of old blood or fresh blood in the  colon.  On the day of discharge, she was afebrile, hemodynamically  stable and having no evidence of GI bleed.   DISCHARGE INSTRUCTIONS:  1. No aspirin or NSAIDs for 30 days.  No anticoagulation for 7 days.  Continue proton pump inhibitor twice daily for 2 weeks then once      daily.  2. She should have iron supplementation.   DISCHARGE MEDICATIONS:  1. Lotrel 10/20 daily.  2. Nu-Iron twice daily.  3. Singulair.  4. Allegra  5. Biotin.  6. Pravastatin.  7. Omeprazole 20 mg twice daily and then once daily.   DISCHARGE LABORATORIES:  Hemoglobin 8.4, hematocrit 24.7.   DISCHARGE DIET:  May resume previous diet but avoid gastric irritants.   DISPOSITION:  Home with follow up with Dr. Cira Servant in two months. Need to  reassess for signs and symptoms of small bowel obstruction.      Kassie Mends, M.D.  Electronically Signed     SM/MEDQ  D:  03/03/2007  T:  03/04/2007  Job:  [id number]   cc:   Ernestina Penna, M.D.  Fax: [phone]

## 2010-08-04 NOTE — Op Note (Signed)
[facility]  Patient:    [name], [name] Visit Number: [id number] MRN: [id number]          Service Type: END Location: DAY Attending Physician:  Jonathon Bellows Dictated by:   Roetta Sessions, M.D. Proc. Date: 04/30/01 Admit Date:  04/30/2001   CC:         Elvina Sidle, M.D.   Operative Report  PROCEDURE:  Colonoscopy, diagnostic, with ileoscopy.  ENDOSCOPIST:  Roetta Sessions, M.D.  INDICATION FOR PROCEDURE:  Patient is a 77 year old lady referred at the courtesy of Dr. Elvina Sidle to further evaluate intermittent rectal bleeding in the setting of chronic constipation.  Colonoscopy is now being done to further evaluate her symptoms.  This approach has been discussed with the patient previously and again at the bedside.  Potential risks, benefits and alternatives have been reviewed and questions answered; she is agreeable. Please see my H&P on the chart for more information.  DESCRIPTION OF PROCEDURE:  O2 saturation, blood pressure, pulse and respirations were monitored throughout the entirety of the procedure.  CONSCIOUS SEDATION:  Versed 5 mg IV and fentanyl 50 mcg IV in divided doses.  Patient received ampicillin 2 g IV and gentamicin 60 mg IV prior to the procedure.  INSTRUMENT:  Olympus video chip colonoscope.  FINDINGS:  Digital rectal exam revealed no abnormalities.  Prep was adequate.  Rectum:  Examination of rectal mucosa including a retroflexed view of the anal verge revealed only some internal hemorrhoids.  Colon:  Colonic mucosa was surveyed from the rectosigmoid junction through the left, transverse and right colon to the area of the appendiceal orifice, ileocecal valve and cecum.  These structures were well-seen and photographed for the record.  Patient had an appendiceal stump, as she is status post appendectomy.  Colonic mucosa to the cecum appeared normal.  The terminal ileum was intubated to 10 cm; this segment of  GI tract also appeared normal. From the level of the cecum and ileocecal valve, the scope was slowly and cautiously withdrawn and all previously mentioned mucosal surfaces were again seen and again, no other abnormalities were observed.  Patient tolerated the procedure well and was reacted at endoscopy.  IMPRESSION: 1. Internal hemorrhoids, otherwise, normal rectum. 2. Normal colon. 3. Normal terminal ileum.  I suspect the patient bled from hemorrhoids in the setting of constipation.  RECOMMENDATIONS: 1. Hemorrhoid literature. 2. Anusol-HC suppositories, one per rectum at bedtime x10 days. 3. Miralax laxative 17 g orally daily on a p.r.n. basis for constipation. 4. Daily fiber supplementation in the way of either Metamucil, Citrucel or    Benefiber. 5. Followup appointment with [place] in six weeks to see how she is doing prior to    concluding the consultation. Dictated by:   Roetta Sessions, M.D. Attending Physician:  Jonathon Bellows DD:  04/30/01 TD:  04/30/01 Job: [id number]

## 2010-08-04 NOTE — Op Note (Signed)
[place]. [facility]  Patient:    [name], [name]                     MRN: [id number] Proc. Date: 11/01/99 Adm. Date:  [id number] Attending:  Twana First                           Operative Report  PREOPERATIVE DIAGNOSIS:  Right hip degenerative joint disease.  POSTOPERATIVE DIAGNOSIS:  Right hip degenerative joint disease.  PROCEDURE: 1. Right total hip replacement using Osteonix total hip system with    acetabulum 52 mm with two locking holes and 10-degree polyethylene liner. 2. Femoral component #6 cemented EON 127-degree angle stem with +0 x 28 mm    femoral head with #2 cement plug and 11 mm centralizer.  SURGEON:  Elana Alm. Thurston Hole, M.D.  ASSISTANT:  Kirstin Adelberger, P.A.  ANESTHESIA:  General  OPERATIVE TIME:  1 hour 40 minutes.  ESTIMATED BLOOD LOSS:  250 cc.  COMPLICATIONS:  None.  DESCRIPTION OF PROCEDURE:  Ms. Krenzer was brought to the operating room on November 01, 1999, and placed on the operative table in supine position.  After an adequate level of general anesthesia was obtained, her right hip was examined.  Under anesthesia, she had forward flexion to 90, extension to 0, internal and external rotation of 20 degrees.  She had approximately 1.5 to 2 cm of shortening in the right leg compared to the left.  Pulses were 2+ and symmetric.  She then had a Foley catheter placed under sterile conditions and had Tequin 400 mg IV for prophylaxis which she had already been on preoperatively for a urinary tract infection last week.  After this was done and the general anesthesia was administered without complication, she was turned in a right lateral decubitus position, secured on the bed with a Mark frame.  Her right hip and leg was then prepped using sterile Betadine and draped using sterile techniques.  Originally through a 25 cm posterolateral greater trochanteric incision, initial exposure was made.  The  underlying subcutaneous tissues were incised along with skin incision.  The iliotibial band and gluteus maximus fascia was incised longitudinally revealing the underlying sciatic nerve which was carefully protected.  Short external rotators of the hip and the hip capsule were released off of their femoral neck insertions intact and then the hip posteriorly dislocated.  She was found to have severe femoral head collapse, grade 4 changes, throughout the femoral head and the acetabulum.  The femoral neck cut was made 2 cm above the lesser trochanter in the appropriate amount of anteversion and inclination.  The acetabulum was then exposed.  Degenerative labrum removed from around the edges.  The retractor was carefully placed and then sequential acetabular reamers were used to ream up to a 52 mm size, and then a 52 mm PSL shelf was hammered into position in the appropriate amount of anteversion and abduction with an excellent fit.  Two locking screws were placed, one in the 11 and one in the 9 oclock position, 20 mm and 16 mm in length, further securing the PSL cup.  A 10-degree polyethylene liner was then placed with the lip in a posterolateral position.  At this point, the proximal femur was exposed. Sequential axial reamers were used to ream up to a #6 size followed by broaches.  The #6 broach was found to be an excellent fit and then a +  0 x 28 mm femoral head trial was placed on the broach.  The hip reduced, taken through range of motion, found to be stable up to 60 to 70 degrees of internal rotation in both neutral and 30 degrees of adduction, and leg lengths were found to be equalized.  The hip was also stable in abduction and external rotation.  The hip was then posteriorly dislocated and the broach was removed. Cement plug was measured and #2 was found to be the appropriate size and this was placed and then the femoral canal was jet lavage irrigated with three liters of saline  solution.  The femoral canal was then filled with cement under pressure and then the actual #6 stem with an 11 mm tip was placed down the femoral canal with an excellent fit with excess cement being removed from around the edges.  After the cement hardened, then a +0 x 28 mm femoral head was hammered onto the femoral neck with a Morse taper fit.  The hip was then reduced and found to be stable again up to 70 degrees of internal rotation in both neutral and 30 degrees of adduction, and stable in abduction and external rotation with leg lengths being equal.  At this point, it was felt that all the components were of excellent size, fit and stability.  The wound was further irrigated with antibiotic solution and saline.  Short external rotators and hip capsule reattached to the femoral neck through two drill holes on the greater tuberosity.  The iliotibial band and gluteus maximus fascia was closed with #1 Panacryl sutures.  The subcutaneous tissue was closed with #0 and 2-0 Vicryl.  Skin was closed with skin staples.  Sterile dressings were applied.  Abduction pillow applied.  The patient was turned supine, extubated and transferred to a recovery room bed and taken to the recovery room in stable condition.  Needle and sponge counts correct x2 at the end of the case. DD:  11/01/99 TD:  11/01/99 Job: [id number]

## 2010-08-04 NOTE — Op Note (Signed)
[name], [name]              ACCOUNT NO.:  [id number]   MEDICAL RECORD NO.:  [id number]          PATIENT TYPE:  INP   LOCATION:  0005                         FACILITY:  [facility]   PHYSICIAN:  Ollen Gross, M.D.    DATE OF BIRTH:  [birth year]   DATE OF PROCEDURE:  03/25/2006  DATE OF DISCHARGE:                               OPERATIVE REPORT   PREOPERATIVE DIAGNOSIS:  Osteoarthritis, right knee.   POSTOPERATIVE DIAGNOSIS:  Osteoarthritis, right knee.   PROCEDURE:  Right total knee arthroplasty.   SURGEON:  Dr. Lequita Halt   ASSISTANT:  Avel Peace, PA-C   ANESTHESIA:  General with postop Marcaine pain pump.   ESTIMATED BLOOD LOSS:  Minimal.   DRAIN:  Hemovac x1.   TOURNIQUET TIME:  37 minutes at 300 mmHg.   COMPLICATIONS:  None.   CONDITION:  Stable to recovery.   BRIEF CLINICAL NOTE:  [name] is a 77 year old female with end-  stage osteoarthritis of the right knee with intractable pain.  She  presents now for total knee arthroplasty.   PROCEDURE IN DETAIL:  After the successful administration of general  anesthetic, a tourniquet is placed high on the right thigh and right  lower extremity prepped and draped in the usual sterile fashion.  Extremity is wrapped in Esmarch, knee flexed, tourniquet inflated to 300  mmHg.  A midline incision made with a 10 blade through subcutaneous  tissue to the level of the extensor mechanism.  A fresh blade is used to  make a medial parapatellar arthrotomy, then the soft tissue over the  proximal and medial tibia is subperiosteally elevated to the joint line  with a knife and into the semimembranosus bursa with a Cobb elevator.  Soft tissue laterally is elevated with attention being paid to avoiding  the patella tendon on tibial tubercle.  The patella is subluxed  laterally, knee flexed 90 degrees; ACL and PCL are removed.  Drill is  used to create a starting hole in the distal femur, and canal is  thoroughly irrigated.  A  5-degree right valgus alignment guide is placed  and referencing off the posterior condyles, rotation is marked and a  block pinned to remove 10 mm off the distal femur.  Distal femoral  resection is made with an oscillating saw.  A sizing block is placed.  Size 2.5 is most appropriate.  The size 2.5 cutting block is then placed  with the rotation marked at the epicondylar axis.  The anterior,  posterior, and chamfer cuts are made.   Tibia is subluxed forward, and the menisci are removed.  She had a  discoid lateral meniscus.  The extramedullary tibial alignment guide is  placed, referencing proximally at the medial aspect of the tibial  tubercle and distally at the second metatarsal axis and tibial crest.  The block is pinned to remove 10 mm off the nondeficient lateral side.  Tibial resection is made with an oscillating saw.  I had to go 2 more mm  to get to the base of the medial defect.  Size 2.5 is the most  appropriate tibial component, and  the proximal tibia is prepared with  the modular drill and keel punch for a 2.5.  Femoral preparation is  completed with the intercondylar cut.   Size 2.5 mobile bearing tibial trial and 2.5 posterior stabilized  femoral trial and a 12.5 mm posterior stabilized rotating platform  insert trial are placed.  With the 12.5, there is a tiny bit of laxity  in flexion and extension, so we went to 15 which allowed for full  extension with excellent varus and valgus balance throughout full range  of motion.  The patella was then everted and thickness measured to be 20  mm.  Free-hand resection is taken to 12 mm, 38 template is placed, lug  holes are drilled, trial patellar is  placed, and it tracks normally.  Osteophytes are removed off the posterior femur with the trial in place.  All trials are removed, and the cut bone surfaces are prepared with  pulsatile lavage.  Cement is mixed and once ready for implantation, the  size 2.5 mobile bearing tibial  tray, size 2.5 posterior stabilized  femur, and 38 patella are cemented into place.  The patella is held with  a clamp.  A 15 mm insert is placed and knee held in full extension, all  extruded cement removed.  Once the cement is fully hardened, then the  permanent 15 mm posterior stabilized rotating platform insert is placed  into the tibial tray.  The is copiously irrigated with saline solution  and the extensor mechanism closed over a Hemovac drain with interrupted  #1 PDS.  Flexion against gravity is 135 degrees.  The tourniquet is  released for a total time of 37 minutes.  Subcu is closed with  interrupted 2-0 Vicryl, subcuticular running 4-0 Monocryl.  The catheter  for the Marcaine pain pump is placed, and the pump is initiated.  Steri-  Strips and a bulky sterile dressing are applied.  Drain is hooked to  suction.  She is placed into a knee immobilizer, awakened, and  transported to recovery in stable condition.      Ollen Gross, M.D.  Electronically Signed     FA/MEDQ  D:  03/25/2006  T:  03/25/2006  Job:  [id number]

## 2010-08-04 NOTE — Op Note (Signed)
[facility]  Patient:    [name], [name]                       MRN: [id number] Proc. Date: 07/01/00 Attending:  Debbe Bales A. Edward Jolly, M.D.                           Operative Report  PREOPERATIVE DIAGNOSIS:       Endometrial polyp with complex and simple hyperplasia.  POSTOPERATIVE DIAGNOSIS:      Endometrial polyp with complex and simple hyperplasia.  PROCEDURE:                    Hysteroscopic polypectomy, fractional dilation and curettage.  SURGEON:                      Brook A. Edward Jolly, M.D.  ANESTHESIA:                   General endotracheal.  IV FLUIDS:                    1000 cc of Ringers lactate.  ESTIMATED BLOOD LOSS:         Minimal.  URINE OUTPUT:                 200 cc.  SORBITOL DEFICIT:             100 cc.  COMPLICATIONS:                None.  INDICATIONS FOR PROCEDURE:    The patient was a 77 year old gravida 3, para 64 female who initially presented for evaluation of postmenopausal bleeding on hormone replacement therapy.  The patient had a pelvic ultrasound, which documented a thickening of the endometrial lining measuring 7.5 mm.  An office endometrial biopsy documented an endometrial polyp with simple and complex hyperplasia without atypia.  The patient was treated with Provera therapy while she was awaiting her surgery.  A recommendation was made for the patient to proceed with further evaluation and treatment of the endometrial polyp and hyperplasia, and she agreed to the procedure after the risks and benefits were reviewed with her.  FINDINGS:                     Examination under anesthesia revealed a small, anteverted mobile uterus.  No adnexal masses were appreciated.  Hysteroscopy documented the presence of a 1 x 1 cm anterior fundal, broad-based polyp.  There was no evidence of any intrauterine fibroids.  The regions of the tubal ostia were visualized and no lesions were noted.  There was no evidence of any lesions  in the cervical canal.  SPECIMENS:                    An endometrial polyp was sent to pathology separately from endocervical and endometrial curettings.  DESCRIPTION OF PROCEDURE:     With an IV in place, the patient was escorted to the operating suite after she was properly identified.  The patient did receive ampicillin 2 g IV as preoperative antibiotic prophylaxis.  The patient was placed in the lateral decubitus position and general endotracheal anesthesia was then induced.  The patients vagina and perineum were then sterilely prepped and the bladder was catheterized of any remaining urine. The patient was then sterilely draped.  Examination under anesthesia was performed with the findings  noted above.  A speculum was placed inside the vagina and a single-tooth tenaculum was placed on the anterior cervical lip.  The cervix was then serially dilated to a #19 Pratt dilator and the diagnostic hysteroscope was inserted into the uterine cavity under the continuous infusion of Sorbitol.  The findings are as noted above.  The diagnostic hysteroscope was then removed and the cervix was then further dilated to a #29 Pratt dilator.  The resectoscope was then inserted into the uterine cavity under continuous infusion of Sorbitol. Monopolar cautery was used to remove the endometrial polyp.  This was sent to pathology.  The hysteroscope was removed.  The endocervix was curetted with a Kevorkian curet and the specimen was sent to pathology.  The endometrial cavity was then curetted in all four quadrants such that a gritty texture was appreciated.  The endometrial curettings were sent to pathology.  The resectoscope was inserted into the uterine cavity one final time under the infusion of the Sorbitol and there was no evidence of any remnant polyps.  The hysteroscope was therefore withdrawn and all of the instruments were removed from the vagina.  The patient was taken out of the dorsal lithotomy  position and was extubated.  She was escorted to the recovery room in stable and awake condition.  There were no complications to the procedure.  All sponge, needle and instrument counts were correct. DD:  07/01/00 TD:  07/01/00 Job: [id number]

## 2010-08-04 NOTE — Discharge Summary (Signed)
[place]. [facility]  Patient:    [name], [name]                     MRN: [id number] Adm. Date:  [id number] Disc. Date: [id number] Attending:  Twana First Dictator:   Kirstin Adelberger, P.A.                           Discharge Summary  ADMISSION DIAGNOSIS:  End-stage degenerative joint disease left hip.  DISCHARGE DIAGNOSES: 1. End-stage degenerative joint disease left hip. 2. Hypertension. 3. Hypokalemia.  HISTORY OF PRESENT ILLNESS:  The patient is a 77 year old female with a history of bilateral hip pain for many years, left being significantly worse than right.  At this point in time she has pain at night, pain with rest, pain with every step, interferes with her activities of daily living causing her to walk with a cane.  She understands risks, benefits, and possible complications of a total hip replacement and is without question.  PROCEDURES:  On August 28, 1999, the patient underwent a left total hip replacement, tolerated the procedure well.  HOSPITAL COURSE:  Postoperatively, she was admitted, begun on Coumadin on postoperative day #0.  On postoperative day #1, hemoglobin 10.9, potassium 3.1, sodium 132.  Catheterized UA done from August 28, 1999, was clear. Surgical wound was well approximated.  We changed her IV fluids to normal saline with 40 mEq of KCl.  On postoperative day #2, potassium 3.0.  Sodium had normalized.  Hemoglobin was 10.1.  Surgical wound was still well approximated.  She was begun on 20 mEq of potassium b.i.d.  She progressed well in physical therapy, ambulating with a walker.  On postoperative day #3, the patient had a t-max of 99.6.  Surgical wound was well approximated.  She was independently ambulatory with a walker.  She was discharged to home with home health physical therapy, R.N. for PT draws.  We will see her back in the office on September 07, 1999.  DISCHARGE MEDICATIONS:  Percocet, Coumadin, Colace, and  K-Dur.  CONDITION ON DISCHARGE:  Stable. DD:  09/18/99 TD:  09/18/99 Job: [id number]

## 2010-08-04 NOTE — Op Note (Signed)
[place]. [facility]  Patient:    [name], DOOLAN                     MRN: [id number] Proc. Date: 01/02/00 Adm. Date:  [id number] Disc. Date: [id number] Attending:  Twana First                           Operative Report  PREOPERATIVE DIAGNOSIS:  Right hip infection with delayed wound healing.  POSTOPERATIVE DIAGNOSIS:  Right hip infection with delayed wound healing.  PROCEDURE:  Right hip irrigation and debridement with wound closure.  SURGEON:  Elana Alm. Thurston Hole, M.D.  ASSISTANT:  Kirstin Adelberger, P.A.  ANESTHESIA:  General anesthesia.  OPERATIVE TIME:  45 minutes.  COMPLICATIONS:  None.  INDICATIONS:  [name] is a 77 year old woman who had undergone a right total hip replacement approximately two months ago.  She developed a superficial wound infection approximately two to three weeks ago and then underwent formal irrigation and debridement, but subsequent to this has had difficulty healing her wound with subsequent drainage and is now to undergo further irrigation, debridement, and wound closure.  DESCRIPTION OF PROCEDURE:  [name] is brought to the operating room on January 02, 2000, and placed on the operating table in the supine position. After an adequate level of general anesthesia was obtained, she was turned in the right lateral decubitus position and secured on the bed with the [place] frame.  Her right hip and leg was prepped using sterile Betadine and draped using sterile technique.  She received Tequin 400 mg IV due to the previous Group B Strep infection which was found in her hip that was susceptible to this. After this was done, the inferior 1/2 of the incision distally had the sutures removed.  The superior and proximal 1/2 of the incision, however, the wound was healing well and these sutures did not need to be removed.  After these sutures were removed, the underlying subcutaneous area and extrafascial layer was  exposed.  No obvious infection was noted, but there was a granulation tissue layer that appeared to have a very shiny appearance to it and this was thoroughly debrided.  Cultures were taken aerobic and anaerobic. After this was done, then the wound was thoroughly irrigated with saline and after this was done, then two medium hemovac drains were placed and then sequential deep sutures were placed securing the subcutaneous fat down to the fascial layer and then more superficial sutures of 2-0 Vicryl placed and then skin staples placed.  After this was done, the drains were secured.  Sterile dressings were applied and a compression dressing was applied and then the patient was awakened after being turned supine and taken to the recovery room in stable condition.  FOLLOW-UP:  Ms. Chenette will be followed overnight at [facility] for IV pain control, neurovascular monitoring, and IV antibiotics. Discharged tomorrow on Tequin 400 mg p.o.  See her back in the office in a week for wound check and follow-up. DD:  01/02/00 TD:  01/02/00 Job: [id number]

## 2010-12-13 LAB — COMPREHENSIVE METABOLIC PANEL
Albumin: 3.8
BUN: 49 — ABNORMAL HIGH
Calcium: 9.8
Chloride: 107
Creatinine, Ser: 2.47 — ABNORMAL HIGH
GFR calc Af Amer: 23 — ABNORMAL LOW
Total Bilirubin: 0.5
Total Protein: 7.1

## 2010-12-13 LAB — CBC
HCT: 38
MCHC: 34.6
MCV: 80
RBC: 4.75
RDW: 17.6 — ABNORMAL HIGH
WBC: 13.6 — ABNORMAL HIGH

## 2010-12-13 LAB — POCT CARDIAC MARKERS
CKMB, poc: 4.7
Myoglobin, poc: 244
Operator id: 264761
Troponin i, poc: <0.05

## 2010-12-13 LAB — URINALYSIS, ROUTINE W REFLEX MICROSCOPIC
Bilirubin Urine: NEGATIVE
Glucose, UA: NEGATIVE
Protein, ur: NEGATIVE
pH: 5

## 2010-12-13 LAB — DIFFERENTIAL
Lymphocytes Relative: 9 — ABNORMAL LOW
Lymphs Abs: 1.2
Monocytes Absolute: 0.8
Neutrophils Relative %: 83 — ABNORMAL HIGH

## 2010-12-13 LAB — URINE CULTURE: Colony Count: 40000

## 2010-12-14 LAB — POCT CARDIAC MARKERS
Operator id: 247131
Troponin i, poc: <0.05

## 2010-12-14 LAB — URINALYSIS, ROUTINE W REFLEX MICROSCOPIC
Hgb urine dipstick: NEGATIVE
Nitrite: NEGATIVE
Protein, ur: NEGATIVE
Specific Gravity, Urine: 1.015

## 2010-12-14 LAB — COMPREHENSIVE METABOLIC PANEL
ALT: 35
AST: 30
CO2: 25
Chloride: 107
Creatinine, Ser: 1.24 — ABNORMAL HIGH
GFR calc Af Amer: 51 — ABNORMAL LOW
Glucose, Bld: 105 — ABNORMAL HIGH
Potassium: 4.5
Total Bilirubin: 0.6

## 2010-12-14 LAB — DIFFERENTIAL
Basophils Absolute: 0
Basophils Relative: 0
Lymphocytes Relative: 10 — ABNORMAL LOW
Lymphs Abs: 1.3
Monocytes Absolute: 0.6
Monocytes Relative: 5
Neutro Abs: 10.2 — ABNORMAL HIGH

## 2010-12-14 LAB — CBC
MCHC: 34.1
Platelets: 391
RDW: 16.7 — ABNORMAL HIGH

## 2010-12-25 LAB — CROSSMATCH
ABO/RH(D): O POS
Antibody Screen: NEGATIVE

## 2010-12-25 LAB — CBC
HCT: 15.4 — ABNORMAL LOW
Hemoglobin: 8.3 — ABNORMAL LOW
MCHC: 33.5
MCV: 87.6
Platelets: 358
RBC: 2.78 — ABNORMAL LOW
RDW: 15.2
WBC: 11.3 — ABNORMAL HIGH
WBC: 12.4 — ABNORMAL HIGH

## 2010-12-25 LAB — HEMOGLOBIN AND HEMATOCRIT, BLOOD
HCT: 25.9 — ABNORMAL LOW
Hemoglobin: 8.4 — ABNORMAL LOW

## 2010-12-25 LAB — DIFFERENTIAL
Basophils Relative: 0
Eosinophils Absolute: 0.5
Lymphocytes Relative: 17
Lymphs Abs: 2.1
Lymphs Abs: 3.3
Monocytes Relative: 5
Monocytes Relative: 9
Neutro Abs: 6.5
Neutro Abs: 9.6 — ABNORMAL HIGH
Neutrophils Relative %: 58
Neutrophils Relative %: 77

## 2011-07-27 ENCOUNTER — Telehealth: Payer: Self-pay

## 2011-07-27 ENCOUNTER — Telehealth: Payer: Self-pay | Admitting: Gastroenterology

## 2011-07-27 ENCOUNTER — Emergency Department (HOSPITAL_COMMUNITY)
Admission: EM | Admit: 2011-07-27 | Discharge: 2011-07-27 | Disposition: A | Discharge status: Home / Self Care | Payer: Medicare Other | Attending: Emergency Medicine | Admitting: Emergency Medicine

## 2011-07-27 DIAGNOSIS — IMO0002 Reserved for concepts with insufficient information to code with codable children: Secondary | ICD-10-CM | POA: Insufficient documentation

## 2011-07-27 DIAGNOSIS — T18108A Unspecified foreign body in esophagus causing other injury, initial encounter: Secondary | ICD-10-CM | POA: Insufficient documentation

## 2011-07-27 MED ORDER — GI COCKTAIL ~~LOC~~
30.0000 mL | Freq: Once | ORAL | Status: AC
  Administered 2011-07-27: 30 mL via ORAL
  Filled 2011-07-27: qty 30

## 2011-07-27 NOTE — Discharge Instructions (Signed)
Please contact your gastroenterologist for followup for upper endoscopy. Make sure that you chew your food thoroughly, stick to a soft diet until seen by gastroenterology. Return to emergency department for worsening condition or new concerning symptoms.  Swallowed Foreign Body, Adult You have swallowed an object (foreign body). Once the foreign body has passed through the food tube (esophagus), which leads from the mouth to the stomach, it will usually continue through the body without problems. This is because the point where the esophagus enters into the stomach is the narrowest place through which the foreign body must pass. Sometimes the foreign body gets stuck. The most common type of foreign body obstruction in adults is food impaction. Many times, bones from fish or meat products may become lodged in the esophagus or injure the throat on the way down. When there is an object that obstructs the esophagus, the most obvious symptoms are pain and the inability to swallow normally. In some cases, foreign bodies that can be life threatening are swallowed. Examples of these are certain medications and illicit drugs. Often in these instances, patients are afraid of telling what they swallowed. However, it is extremely important to tell the emergency caregiver what was swallowed because life-saving treatment may be needed.  X-ray exams may be taken to find the location of the foreign body. However, some objects do not show up well or may be too small to be seen on an X-ray image. If the foreign body is too large or too sharp, it may be too dangerous to allow it to pass on its own. You may need to see a caregiver who specializes in the digestive system (gastroenterologist). In a few cases, a specialist may need to remove the object using a method called "endoscopy". This involves passing a thin, soft, flexible tube into the food pipe to locate and remove the object. Follow up with your primary doctor or the  referral you were given by the emergency caregiver. HOME CARE INSTRUCTIONS   If your caregiver says it is safe for you to eat, then only have liquids and soft foods until your symptoms improve.   Once you are eating normally:   Cut food into small pieces.   Remove small bones from food.   Remove large seeds and pits from fruit.   Chew your food well.   Do not talk, laugh, or engage in physical activity while eating or swallowing.  SEEK MEDICAL CARE IF:  You develop worsening shortness of breath, uncontrollable coughing, chest pains or high fever, greater than 102 F (38.9 C).   You are unable to eat or drink or you feel that food is getting stuck in your throat.   You have choking symptoms or cannot stop drooling.   You develop abdominal pain, vomiting (especially of blood), or rectal bleeding.  MAKE SURE YOU:   Understand these instructions.   Will watch your condition.   Will get help right away if you are not doing well or get worse.  Document Released: 08/23/2009 Document Revised: 02/22/2011 Document Reviewed: 08/23/2009 [facility] Patient Information 2012 [place], [place].

## 2011-07-27 NOTE — ED Provider Notes (Signed)
History     CSN: [id number]  Arrival date & time 07/27/11  4098   First MD Initiated Contact with Patient 07/27/11 0050      Chief Complaint  Patient presents with  . Swallowed Foreign Body    (Consider location/radiation/quality/duration/timing/severity/associated sxs/prior treatment) HPI 78 year old female presents to emergency department with complaint of food stuck in her throat. Patient reports she had venison for dinner, and she felt a piece get stuck in her throat. Patient reports similar symptoms the previous night with a hush puppy, but was able to swallow it. Patient with discomfort in her mid chest with swallowing. She is able to handle her own secretions, she has not had any vomiting. Patient denies previous history of food impaction. Patient reports history of hypertension remote history of GI bleeding, history of reflux No past medical history on file.  No past surgical history on file.  No family history on file.  History  Substance Use Topics  . Smoking status: Not on file  . Smokeless tobacco: Not on file  . Alcohol Use: Not on file    OB History    No data available      Review of Systems  All other systems reviewed and are negative.    Allergies  Meperidine hcl  Home Medications  No current outpatient prescriptions on file.  BP 163/113  Pulse 90  Temp(Src) 98.3 F (36.8 C) (Oral)  Resp 16  SpO2 98%  Physical Exam  Nursing note and vitals reviewed. Constitutional: She is oriented to person, place, and time. She appears well-developed and well-nourished. She appears distressed.       Patient has been hypertensive, is anxious and upset  HENT:  Head: Normocephalic and atraumatic.  Nose: Nose normal.  Mouth/Throat: Oropharynx is clear and moist.  Eyes: Conjunctivae and EOM are normal. Pupils are equal, round, and reactive to light.  Neck: Normal range of motion. Neck supple. No JVD present. No tracheal deviation present. No thyromegaly  present.  Cardiovascular: Normal rate, regular rhythm, normal heart sounds and intact distal pulses.  Exam reveals no gallop and no friction rub.   No murmur heard. Pulmonary/Chest: Effort normal and breath sounds normal. No stridor. No respiratory distress. She has no wheezes. She has no rales. She exhibits no tenderness.  Abdominal: Soft. Bowel sounds are normal. She exhibits no distension and no mass. There is no tenderness. There is no rebound and no guarding.  Musculoskeletal: Normal range of motion. She exhibits no edema and no tenderness.  Lymphadenopathy:    She has no cervical adenopathy.  Neurological: She is oriented to person, place, and time. She exhibits normal muscle tone. Coordination normal.  Skin: Skin is dry. No rash noted. No erythema. No pallor.  Psychiatric: Her behavior is normal. Judgment and thought content normal.       patient is anxious    ED Course  Procedures (including critical care time)  Labs Reviewed - No data to display No results found.   1. Esophageal foreign body       MDM  78 rolled female with food bolus impaction. After drinking a small amount of Coca-Cola, patient able to swallow and has no further lodged sensation. Patient given GI cocktail and reports full resolution of symptoms. Per notes, patient has been seen before by GI in [place], and will be referred back to them so they can do an EGD for possible stricture        Olivia Mackie, MD 07/27/11 [phone]

## 2011-07-27 NOTE — Telephone Encounter (Signed)
Called, [facility] for a return call before 12:00 today, we leave at noon.

## 2011-07-27 NOTE — ED Notes (Signed)
Patient is AOx4 and comfortable with her discharge instructions. 

## 2011-07-27 NOTE — ED Notes (Signed)
FB in throat after eating deer meat

## 2011-07-27 NOTE — Telephone Encounter (Signed)
Call pt. She needs to follow a soft mechanical diet. MEATS SHOULD BE CHOPPED OR GROUND. SHE SHOULD NOT EAT VENISON OR HUSH PUPPIES. PUT HER ON FOR AN EGD/DIL MON 5/13. TRIAGE MEDS.     SOFT MECHANICAL DIET This SOFT MECHANICAL DIET is restricted to:  Foods that are moist, soft-textured, and easy to chew and swallow.   Meats that are ground or are minced no larger than one-quarter inch pieces. Meats are moist with gravy or sauce added.   Foods that do not include bread or bread-like textures except soft pancakes, well-moistened with syrup or sauce.   Textures with some chewing ability required.   Casseroles without rice.   Cooked vegetables that are less than half an inch in size and easily mashed with a fork. No cooked corn, peas, broccoli, cauliflower, cabbage, Brussels sprouts, asparagus, or other fibrous, non-tender or rubbery cooked vegetables.   Canned fruit except for pineapple. Fruit must be cut into pieces no larger than half an inch in size.   Foods that do not include nuts, seeds, coconut, or sticky textures.    FOOD TEXTURES FOR DYSPHAGIA DIET LEVEL 2 -SOFT MECHANICAL DIET (includes all foods on Dysphagia Diet Level 1 - Pureed, in addition to the foods listed below)  FOOD GROUP: Breads. RECOMMENDED: Soft pancakes, well-moistened with syrup or sauce. AVOID: All others.  FOOD GROUP: Cereals. RECOMMENDED: Cooked cereals with little texture, including oatmeal. Unprocessed wheat bran stirred into cereals for bulk. Note: If thin liquids are restricted, it is important that all of the liquid is absorbed into the cereal. AVOID: All dry cereals and any cooked cereals that may contain flax seeds or other seeds or nuts. Whole-grain, dry, or coarse cereals. Cereals with nuts, seeds, dried fruit, and/or coconut.  FOOD GROUP: Desserts. RECOMMENDED: Pudding, custard. Soft fruit pies with bottom crust only. Canned fruit (excluding pineapple). Soft, moist cakes with icing.Frozen malts,  milk shakes, frozen yogurt, eggnog, nutritional supplements, ice cream, sherbet, regular or sugar-free gelatin, or any foods that become thin liquid at either room (70 F) or body temperature (98 F). AVOID: Dry, coarse cakes and cookies. Anything with nuts, seeds, coconut, pineapple, or dried fruit. Breakfast yogurt with nuts. Rice or bread pudding.  FOOD GROUP: Fats. RECOMMENDED: Butter, margarine, cream for cereal (depending on liquid consistency recommendations), gravy, cream sauces, sour cream, sour cream dips with soft additives, mayonnaise, salad dressings, cream cheese, cream cheese spreads with soft additives, whipped toppings. AVOID: All fats with coarse or chunky additives.  FOOD GROUP: Fruits. RECOMMENDED: Soft drained, canned, or cooked fruits without seeds or skin. Fresh soft and ripe banana. Fruit juices with a small amount of pulp. If thin liquids are restricted, fruit juices should be thickened to appropriate consistency. AVOID: Fresh or frozen fruits. Cooked fruit with skin or seeds. Dried fruits. Fresh, canned, or cooked pineapple.  FOOD GROUP: Meats and Meat Substitutes. (Meat pieces should not exceed 1/4 of an inch cube and should be tender.) RECOMMENDED: Moistened ground or cooked meat, poultry, or fish. Moist ground or tender meat may be served with gravy or sauce. Casseroles without rice. Moist macaroni and cheese, well-cooked pasta with meat sauce, tuna noodle casserole, soft, moist lasagna. Moist meatballs, meatloaf, or fish loaf. Protein salads, such as tuna or egg without large chunks, celery, or onion. Cottage cheese, smooth quiche without large chunks. Poached, scrambled, or soft-cooked eggs (egg yolks should not be "runny" but should be moist and able to be mashed with butter, margarine, or other moisture added to  them). (Cook eggs to 160 F or use pasteurized eggs for safety.) Souffls may have small, soft chunks. Tofu. Well-cooked, slightly mashed, moist legumes, such  as baked beans. All meats or protein substitutes should be served with sauces or moistened to help maintain cohesiveness in the oral cavity. AVOID: Dry meats, tough meats (such as bacon, sausage, hot dogs, bratwurst). Dry casseroles or casseroles with rice or large chunks. Peanut butter. Cheese slices and cubes. Hard-cooked or crisp fried eggs. Sandwiches.Pizza.  FOOD GROUP: Potatoes and Starches. RECOMMENDED: Well-cooked, moistened, boiled, baked, or mashed potatoes. Well-cooked shredded hash brown potatoes that are not crisp. (All potatoes need to be moist and in sauces.)Well-cooked noodles in sauce. Spaetzel or soft dumplings that have been moistened with butter or gravy. AVOID: Potato skins and chips. Fried or French-fried potatoes. Rice.  FOOD GROUP: Soups. RECOMMENDED: Soups with easy-to-chew or easy-to-swallow meats or vegetables: Particle sizes in soups should be less than 1/2 inch. Soups will need to be thickened to appropriate consistency if soup is thinner than prescribed liquid consistency. AVOID: Soups with large chunks of meat and vegetables. Soups with rice, corn, peas.  FOOD GROUP: Vegetables. RECOMMENDED: All soft, well-cooked vegetables. Vegetables should be less than a half inch. Should be easily mashed with a fork. AVOID: Cooked corn and peas. Broccoli, cabbage, Brussels sprouts, asparagus, or other fibrous, non-tender or rubbery cooked vegetables.  FOOD GROUP: Miscellaneous. RECOMMENDED: Jams and preserves without seeds, jelly. Sauces, salsas, etc., that may have small tender chunks less than 1/2 inch. Soft, smooth chocolate bars that are easily chewed. AVOID: Seeds, nuts, coconut, or sticky foods. Chewy candies such as caramels or licorice.

## 2011-07-27 NOTE — ED Notes (Signed)
The patient states she was eating deer meat somewhere between 2200 and 2230 when she felt a piece get stuck in her throat.  She was able to swallow and clear her airway, but she still feels like she has a small piece of meat stuck in her throat.

## 2011-07-27 NOTE — Telephone Encounter (Signed)
LMOM to call. ( Please see other note for recommendations for soft diet).

## 2011-07-30 ENCOUNTER — Telehealth: Payer: Self-pay | Admitting: Gastroenterology

## 2011-07-30 NOTE — Telephone Encounter (Signed)
CALL PT. SHE NEEDS OPV TO DISCUSS THE PROCEDURE. HER SWALLOWING IS NOT GOING TO GET BETTER. IT'S ONLY GOING TO GET WORSE.

## 2011-07-30 NOTE — Telephone Encounter (Signed)
REVIEWED.  

## 2011-07-30 NOTE — Telephone Encounter (Signed)
Called pt back- she agreed to see SLF in the office- appt scheduled for 05/16

## 2011-07-30 NOTE — Telephone Encounter (Signed)
Per Cherene Julian, she just spoke to pt and she does not want to proceed with EGD.

## 2011-07-30 NOTE — Telephone Encounter (Signed)
Message copied by Irish Elders on Mon Jul 30, 2011 10:38 AM ------      Message from: [place]      Created: Mon Jul 30, 2011 10:32 AM       PLEASE TELL PT SHE NEEDS TO SEE ME IN THE OFFICE TO DISCUSS. IT IS NOT NORMAL FOR HER TO GET FOOD STUCK IN HER ESOPHAGUS. SHE NEEDS AN EGD WITH DILATION. THE NARROWING IN HER ESOPHAGUS IS NOT GOING TO GET BETTER. IT'S ONLY GOING TO GET WORSE.             ----- Message -----         From: Irish Elders         Sent: 07/30/2011  10:06 AM           To: [place], MD            Pt called back this morning- she does not want to proceed with the EGD/ED- She states she is doing fine and is not having any difficulty swallowing-:" feels like it was a one time deal"

## 2011-07-30 NOTE — Telephone Encounter (Signed)
LMOM to call.

## 2011-07-30 NOTE — Telephone Encounter (Signed)
Informed pt and [name] is scheduling appt.

## 2011-07-30 NOTE — Telephone Encounter (Signed)
Per Soledad Gerlach, pt has appt with Dr. Darrick Penna on 08/02/2011.

## 2011-07-31 NOTE — Telephone Encounter (Signed)
LMOM she does need to be on soft mechanical diet for now.

## 2011-08-01 ENCOUNTER — Encounter: Payer: Self-pay | Admitting: Internal Medicine

## 2011-08-02 ENCOUNTER — Encounter: Payer: Self-pay | Admitting: Gastroenterology

## 2011-08-02 ENCOUNTER — Ambulatory Visit (INDEPENDENT_AMBULATORY_CARE_PROVIDER_SITE_OTHER): Payer: Medicare Other | Admitting: Gastroenterology

## 2011-08-02 VITALS — BP 135/83 | HR 91 | Temp 97.9°F | Ht 63.0 in | Wt 174.8 lb

## 2011-08-02 DIAGNOSIS — T18128A Food in esophagus causing other injury, initial encounter: Secondary | ICD-10-CM

## 2011-08-02 DIAGNOSIS — W44F3XA Food entering into or through a natural orifice, initial encounter: Secondary | ICD-10-CM | POA: Insufficient documentation

## 2011-08-02 DIAGNOSIS — T18108A Unspecified foreign body in esophagus causing other injury, initial encounter: Secondary | ICD-10-CM

## 2011-08-02 NOTE — Assessment & Plan Note (Signed)
PT FEELS IT WAS A ONE TIME OCCURRENCE.  AGREED TO BPE & UF STRICTURE THE EGD/DIL. IF STRICTURE PT WILL NEED PPI. OPV PRN.

## 2011-08-02 NOTE — Progress Notes (Signed)
  Subjective:    Patient ID: [name], female    DOB: 01/30/1933, 78 y.o.   MRN: [id number]  PCP: PICKARD  HPI ATE GRILLED DEER MEAT. SOM COOKED IT AND TEH EDGES WERE HARD. NO HEARTBURN, NAUSEA, OR VOMITING, CONSTIPATION OR ABDOMINAL PINA. WEIGHT LOSS: NO. APPETITE: NL. NO PROBLEM SWALLOWING PILLS.  Past Medical History  Diagnosis Date  . Hypercholesterolemia   . Hypertension   . Mitral valve prolapse   . Mild acid reflux   . Staph infection      history of a wound infection following a previous right hip surgery    Past Surgical History  Procedure Date  . Right breast lumpectomy   . Left total hip replacement arthroplasty June of 2001  . Right total hip replacement arthroplasty August of 2001  . Abcess drainage     Allergies  Allergen Reactions  . Meperidine Hcl     Current Outpatient Prescriptions  Medication Sig Dispense Refill  . amLODipine-benazepril (LOTREL) 10-20 MG per capsule Take 1 capsule by mouth daily.       . Biotin 2500 MCG CAPS Take 2,500 mg by mouth daily.      . citalopram (CELEXA) 20 MG tablet Take 20 mg by mouth daily.       . cyclobenzaprine (FLEXERIL) 10 MG tablet Take 10 mg by mouth 2 (two) times daily as needed.       . fish oil-omega-3 fatty acids 1000 MG capsule Take 2 g by mouth daily.      . Multiple Vitamin (MULTIVITAMIN) capsule Take 1 capsule by mouth daily.      . Pyridoxine HCl (VITAMIN B-6) 250 MG tablet Take 250 mg by mouth daily.      . Thiamine HCl (VITAMIN B-1) 250 MG tablet Take 250 mg by mouth daily.      . vitamin B-12 (CYANOCOBALAMIN) 500 MCG tablet Take 500 mcg by mouth daily.          Review of Systems     Objective:   Physical Exam  Vitals reviewed. Constitutional: She is oriented to person, place, and time. She appears well-nourished. No distress.  HENT:  Head: Normocephalic and atraumatic.  Mouth/Throat: Oropharynx is clear and moist. No oropharyngeal exudate.  Eyes: Pupils are equal, round, and reactive to  light. No scleral icterus.  Neck: Normal range of motion. Neck supple.  Cardiovascular: Normal rate, regular rhythm and normal heart sounds.   Pulmonary/Chest: Effort normal and breath sounds normal. No respiratory distress.  Abdominal: Soft. Bowel sounds are normal. She exhibits no distension. There is no tenderness.  Musculoskeletal: She exhibits no edema.  Lymphadenopathy:    She has no cervical adenopathy.  Neurological: She is alert and oriented to person, place, and time.       NO FOCAL DEFICITS   Psychiatric: She has a normal mood and affect.          Assessment & Plan:

## 2011-08-02 NOTE — Patient Instructions (Addendum)
COMPLETE YOUR SWALLOWING STUDY. I WILL CALL YOU WITH TH RESULTS. IF YOU HAVE A STRICTURE YOU SHOULD HAVE YOUR ESOPHAGUS STRETCHED.  FOLLOW UP AS NEEDED.

## 2011-08-07 ENCOUNTER — Ambulatory Visit (HOSPITAL_COMMUNITY)
Admission: RE | Admit: 2011-08-07 | Discharge: 2011-08-07 | Disposition: A | Discharge status: Home / Self Care | Payer: Medicare Other | Source: Ambulatory Visit | Attending: Gastroenterology | Admitting: Gastroenterology

## 2011-08-07 DIAGNOSIS — T18128A Food in esophagus causing other injury, initial encounter: Secondary | ICD-10-CM

## 2011-08-07 DIAGNOSIS — R1310 Dysphagia, unspecified: Secondary | ICD-10-CM | POA: Insufficient documentation

## 2011-08-16 ENCOUNTER — Telehealth: Payer: Self-pay | Admitting: Gastroenterology

## 2011-08-16 NOTE — Telephone Encounter (Signed)
CALLED PT TO DISCUSS RESULTS. LVM-CALL [phone] TO DISCUSS. PT NEEDS TO FOLLOW A SOFT MECHANICAL DIET. MEATS SHOULD BE CHOPPED OR GROUND. OPV PRN.

## 2011-08-16 NOTE — Telephone Encounter (Signed)
Results Cc to PCP  

## 2011-08-17 NOTE — Telephone Encounter (Signed)
Called and informed pt.  

## 2011-09-27 ENCOUNTER — Encounter: Payer: Self-pay | Admitting: Gastroenterology

## 2012-03-05 ENCOUNTER — Other Ambulatory Visit: Payer: Self-pay | Admitting: Family Medicine

## 2012-03-05 DIAGNOSIS — R29898 Other symptoms and signs involving the musculoskeletal system: Secondary | ICD-10-CM

## 2012-03-05 DIAGNOSIS — M549 Dorsalgia, unspecified: Secondary | ICD-10-CM

## 2012-03-08 ENCOUNTER — Other Ambulatory Visit: Payer: Medicare Other

## 2012-03-17 ENCOUNTER — Other Ambulatory Visit: Payer: Medicare Other

## 2012-03-20 ENCOUNTER — Ambulatory Visit
Admission: RE | Admit: 2012-03-20 | Discharge: 2012-03-20 | Disposition: A | Discharge status: Home / Self Care | Payer: Medicare Other | Source: Ambulatory Visit | Attending: Family Medicine | Admitting: Family Medicine

## 2012-03-20 DIAGNOSIS — R29898 Other symptoms and signs involving the musculoskeletal system: Secondary | ICD-10-CM

## 2012-03-20 DIAGNOSIS — M549 Dorsalgia, unspecified: Secondary | ICD-10-CM

## 2012-07-23 ENCOUNTER — Other Ambulatory Visit: Payer: Self-pay | Admitting: Family Medicine

## 2012-09-05 ENCOUNTER — Telehealth: Payer: Self-pay | Admitting: Family Medicine

## 2012-09-05 MED ORDER — CYCLOBENZAPRINE HCL 10 MG PO TABS: ORAL_TABLET | ORAL | Status: DC

## 2012-09-05 NOTE — Telephone Encounter (Signed)
Ok to refill 

## 2012-09-05 NOTE — Telephone Encounter (Signed)
Rx Refilled  

## 2012-09-05 NOTE — Telephone Encounter (Signed)
?   OK to Refill  

## 2012-10-04 ENCOUNTER — Other Ambulatory Visit: Payer: Self-pay | Admitting: Family Medicine

## 2012-10-06 NOTE — Telephone Encounter (Signed)
Med refilled.

## 2012-10-30 ENCOUNTER — Other Ambulatory Visit: Payer: Self-pay | Admitting: Family Medicine

## 2012-11-04 ENCOUNTER — Telehealth: Payer: Self-pay | Admitting: Family Medicine

## 2012-11-04 DIAGNOSIS — Z79899 Other long term (current) drug therapy: Secondary | ICD-10-CM

## 2012-11-04 DIAGNOSIS — E785 Hyperlipidemia, unspecified: Secondary | ICD-10-CM

## 2012-11-04 NOTE — Telephone Encounter (Signed)
LMTRC

## 2012-11-04 NOTE — Telephone Encounter (Signed)
Silver Sulfa 1% cream apply topically to groin BID #50

## 2012-11-04 NOTE — Telephone Encounter (Signed)
Ok, but why does she need it, does she have a burn?

## 2012-11-04 NOTE — Telephone Encounter (Signed)
?   OK to Refill  

## 2012-11-05 MED ORDER — SILVER SULFADIAZINE 1 % EX CREA: 1.0000 "application " | TOPICAL_CREAM | CUTANEOUS | Status: DC | PRN

## 2012-11-05 NOTE — Telephone Encounter (Signed)
Pt uses it in her folds of her legs when she get a rash and states that it does help get rid of it. Rx sent to pharmacy.

## 2012-11-06 ENCOUNTER — Other Ambulatory Visit: Payer: Medicare Other

## 2012-11-06 DIAGNOSIS — E785 Hyperlipidemia, unspecified: Secondary | ICD-10-CM

## 2012-11-06 DIAGNOSIS — Z79899 Other long term (current) drug therapy: Secondary | ICD-10-CM

## 2012-11-06 LAB — COMPREHENSIVE METABOLIC PANEL
ALT: 52 U/L — ABNORMAL HIGH (ref 0–35)
CO2: 25 mEq/L (ref 19–32)
Calcium: 9.8 mg/dL (ref 8.4–10.5)
Chloride: 104 mEq/L (ref 96–112)
Sodium: 139 mEq/L (ref 135–145)
Total Protein: 6.6 g/dL (ref 6.0–8.3)

## 2012-11-06 LAB — LIPID PANEL
Cholesterol: 205 mg/dL — ABNORMAL HIGH (ref 0–200)
VLDL: 48 mg/dL — ABNORMAL HIGH (ref 0–40)

## 2012-11-06 LAB — CBC WITH DIFFERENTIAL/PLATELET
Lymphocytes Relative: 31 % (ref 12–46)
Lymphs Abs: 2 10*3/uL (ref 0.7–4.0)
Neutrophils Relative %: 48 % (ref 43–77)
Platelets: 390 10*3/uL (ref 150–400)
RBC: 4.89 MIL/uL (ref 3.87–5.11)
WBC: 6.6 10*3/uL (ref 4.0–10.5)

## 2012-11-10 ENCOUNTER — Ambulatory Visit (INDEPENDENT_AMBULATORY_CARE_PROVIDER_SITE_OTHER): Payer: Medicare Other | Admitting: Family Medicine

## 2012-11-10 ENCOUNTER — Encounter: Payer: Self-pay | Admitting: Family Medicine

## 2012-11-10 VITALS — BP 154/90 | HR 80 | Temp 97.8°F | Resp 18 | Wt 174.0 lb

## 2012-11-10 DIAGNOSIS — I10 Essential (primary) hypertension: Secondary | ICD-10-CM

## 2012-11-10 DIAGNOSIS — N1832 Chronic kidney disease, stage 3b: Secondary | ICD-10-CM | POA: Insufficient documentation

## 2012-11-10 DIAGNOSIS — E781 Pure hyperglyceridemia: Secondary | ICD-10-CM

## 2012-11-10 DIAGNOSIS — N183 Chronic kidney disease, stage 3 unspecified: Secondary | ICD-10-CM

## 2012-11-10 DIAGNOSIS — M4800 Spinal stenosis, site unspecified: Secondary | ICD-10-CM | POA: Insufficient documentation

## 2012-11-10 DIAGNOSIS — M48061 Spinal stenosis, lumbar region without neurogenic claudication: Secondary | ICD-10-CM

## 2012-11-10 MED ORDER — ATENOLOL 50 MG PO TABS: 50.0000 mg | ORAL_TABLET | Freq: Every day | ORAL | Status: DC

## 2012-11-10 NOTE — Progress Notes (Signed)
Subjective:    Patient ID: [name], female    DOB: Jul 27, 1932, 79 y.o.   MRN: [id number]  HPI  Patient is a very sweet and pleasant 79 year old female who is here today to followup her medical problems. Problem #1 is hypertension. She is currently on Lotrel 10/20 one by mouth daily. She denies any chest pain, shortness of breath, dyspnea on exertion. However, her blood pressure is elevated at 154/90.  She also has hyperlipidemia/dyslipidemia. Her most recent lab work is listed below. She is currently taking fentanyl 2000 mg by mouth daily. She admits that she edema or and fatty foods along with more sodium. Her labwork is also significant for a stable creatinine of 1.3 and a slightly elevated ALT. Appointment on 11/06/2012  Component Date Value Range Status  . WBC 11/06/2012 6.6  4.0 - 10.5 K/uL Final  . RBC 11/06/2012 4.89  3.87 - 5.11 MIL/uL Final  . Hemoglobin 11/06/2012 14.3  12.0 - 15.0 g/dL Final  . HCT 95/62/1308 41.8  36.0 - 46.0 % Final  . MCV 11/06/2012 85.5  78.0 - 100.0 fL Final  . MCH 11/06/2012 29.2  26.0 - 34.0 pg Final  . MCHC 11/06/2012 34.2  30.0 - 36.0 g/dL Final  . RDW 65/78/4696 14.8  11.5 - 15.5 % Final  . Platelets 11/06/2012 390  150 - 400 K/uL Final  . Neutrophils Relative % 11/06/2012 48  43 - 77 % Final  . Neutro Abs 11/06/2012 3.2  1.7 - 7.7 K/uL Final  . Lymphocytes Relative 11/06/2012 31  12 - 46 % Final  . Lymphs Abs 11/06/2012 2.0  0.7 - 4.0 K/uL Final  . Monocytes Relative 11/06/2012 10  3 - 12 % Final  . Monocytes Absolute 11/06/2012 0.6  0.1 - 1.0 K/uL Final  . Eosinophils Relative 11/06/2012 10* 0 - 5 % Final  . Eosinophils Absolute 11/06/2012 0.7  0.0 - 0.7 K/uL Final  . Basophils Relative 11/06/2012 1  0 - 1 % Final  . Basophils Absolute 11/06/2012 0.0  0.0 - 0.1 K/uL Final  . Smear Review 11/06/2012 Criteria for review not met   Final  . Sodium 11/06/2012 139  135 - 145 mEq/L Final  . Potassium 11/06/2012 4.5  3.5 - 5.3 mEq/L Final  .  Chloride 11/06/2012 104  96 - 112 mEq/L Final  . CO2 11/06/2012 25  19 - 32 mEq/L Final  . Glucose, Bld 11/06/2012 77  70 - 99 mg/dL Final  . BUN 29/52/8413 21  6 - 23 mg/dL Final  . Creat 24/40/1027 1.30* 0.50 - 1.10 mg/dL Final  . Total Bilirubin 11/06/2012 0.4  0.3 - 1.2 mg/dL Final  . Alkaline Phosphatase 11/06/2012 68  39 - 117 U/L Final  . AST 11/06/2012 28  0 - 37 U/L Final  . ALT 11/06/2012 52* 0 - 35 U/L Final  . Total Protein 11/06/2012 6.6  6.0 - 8.3 g/dL Final  . Albumin 25/36/6440 4.3  3.5 - 5.2 g/dL Final  . Calcium 34/74/2595 9.8  8.4 - 10.5 mg/dL Final  . Cholesterol 63/87/5643 205* 0 - 200 mg/dL Final   Comment: ATP III Classification:                                < 200        mg/dL        Desirable  200 - 239     mg/dL        Borderline High                               >= 240        mg/dL        High                             . Triglycerides 11/06/2012 239* <150 mg/dL Final  . HDL 16/12/9602 51  >39 mg/dL Final  . Total CHOL/HDL Ratio 11/06/2012 4.0   Final  . VLDL 11/06/2012 48* 0 - 40 mg/dL Final  . LDL Cholesterol 11/06/2012 106* 0 - 99 mg/dL Final   Comment:                            Total Cholesterol/HDL Ratio:CHD Risk                                                 Coronary Heart Disease Risk Table                                                                 Men       Women                                   1/2 Average Risk              3.4        3.3                                       Average Risk              5.0        4.4                                    2X Average Risk              9.6        7.1                                    3X Average Risk             23.4       11.0                          Use the calculated Patient Ratio above and the CHD Risk table  to determine the patient's CHD Risk.                          ATP III Classification (LDL):                                < 100         mg/dL         Optimal                               100 - 129     mg/dL         Near or Above Optimal                               130 - 159     mg/dL         Borderline High                               160 - 189     mg/dL         High                                > 190        mg/dL         Very High                              She also continues to complain of pain in her lower back that radiates into her left and right legs at times. The pain is neuropathic in nature. She reports dysesthesias in both legs. She denies any leg weakness. She denies any symptoms of cauda equina syndrome. Her MRI results are listed below: Scoliosis and advanced degenerative lumbar spondylosis. There is  multilevel multifactorial spinal, lateral recess and foraminal  stenosis as discussed above at the individual levels.  The findings are most pronounced at L2 and L3. However her chronic kidney disease makes taking regular NSAIDs problematic. She continues to report pain and is interested in discussing further options. Past Medical History  Diagnosis Date  . Hypercholesterolemia   . Hypertension   . Mitral valve prolapse   . Mild acid reflux   . Staph infection      history of a wound infection following a previous right hip surgery  . CKD (chronic kidney disease) stage 3, GFR 30-59 ml/min   . Spinal stenosis    Past Surgical History  Procedure Laterality Date  . Right breast lumpectomy    . Left total hip replacement arthroplasty  June of 2001  . Right total hip replacement arthroplasty  August of 2001  . Abcess drainage     Current Outpatient Prescriptions on File Prior to Visit  Medication Sig Dispense Refill  . amLODipine-benazepril (LOTREL) 10-20 MG per capsule TAKE 1 CAPSULE DAILY  90 capsule  0  . Biotin 2500 MCG CAPS Take 2,500 mg by mouth daily.      . citalopram (CELEXA) 20 MG tablet TAKE 1 TABLET DAILY  90 tablet  0  . cyclobenzaprine (FLEXERIL) 10 MG tablet TAKE ONE  TABLET BY  MOUTH EVERY 8 HOURS AS NEEDED  90 tablet  0  . fish oil-omega-3 fatty acids 1000 MG capsule Take 2 g by mouth daily.      . Multiple Vitamin (MULTIVITAMIN) capsule Take 1 capsule by mouth daily.      . Pyridoxine HCl (VITAMIN B-6) 250 MG tablet Take 250 mg by mouth daily.      . silver sulfADIAZINE (SILVADENE) 1 % cream Apply 1 application topically as needed.  50 g  0  . Thiamine HCl (VITAMIN B-1) 250 MG tablet Take 250 mg by mouth daily.      . vitamin B-12 (CYANOCOBALAMIN) 500 MCG tablet Take 500 mcg by mouth daily.       No current facility-administered medications on file prior to visit.   Allergies  Allergen Reactions  . Meperidine Hcl    History   Social History  . Marital Status: Widowed    Spouse Name: N/A    Number of Children: N/A  . Years of Education: N/A   Occupational History  . Not on file.   Social History Main Topics  . Smoking status: Never Smoker   . Smokeless tobacco: Not on file  . Alcohol Use: No  . Drug Use: No  . Sexual Activity: Not on file   Other Topics Concern  . Not on file   Social History Narrative  . No narrative on file     Review of Systems  All other systems reviewed and are negative.       Objective:   Physical Exam  Vitals reviewed. Constitutional: She appears well-developed and well-nourished. No distress.  HENT:  Mouth/Throat: Oropharynx is clear and moist.  Neck: Neck supple. No thyromegaly present.  Cardiovascular: Normal rate, regular rhythm and normal heart sounds.   No murmur heard. Pulmonary/Chest: Effort normal and breath sounds normal. No respiratory distress. She has no wheezes. She has no rales. She exhibits no tenderness.  Abdominal: Soft. Bowel sounds are normal. She exhibits no distension. There is no tenderness. There is no rebound and no guarding.  Musculoskeletal: She exhibits no edema.  Lymphadenopathy:    She has no cervical adenopathy.  Skin: She is not diaphoretic.          Assessment &  Plan:  1. HTN (hypertension) Blood pressure is elevated. I recommended adding atenolol 50 mg by mouth daily and recheck her blood pressure in one month - atenolol (TENORMIN) 50 MG tablet; Take 1 tablet (50 mg total) by mouth daily.  Dispense: 90 tablet; Refill: 3  2. CKD (chronic kidney disease) stage 3, GFR 30-59 ml/min Creatinine is stable. I recommended controlled her blood pressure was strictly and decreasing her sodium intake.  3. Hypertriglyceridemia Recommend a low saturated fat low-cholesterol diet. Recheck cholesterol in 3 months. I did recommend increasing aerobic exercise  4. Spinal stenosis of lumbar region I recommended consulting neurosurgeon so she can talk about her options. Given her age I would rather her talk to the surgeon now rather than waiting until any surgery would be more dangerous as her age advances. If surgery is a poor option, I would recommend narcotics and lyrica.  She like to see the surgeon and weigh her options. - Ambulatory referral to Neurosurgery

## 2012-11-22 ENCOUNTER — Other Ambulatory Visit: Payer: Self-pay | Admitting: Family Medicine

## 2012-11-24 NOTE — Telephone Encounter (Signed)
Ok to refill 

## 2012-11-24 NOTE — Telephone Encounter (Signed)
ok 

## 2012-11-24 NOTE — Telephone Encounter (Signed)
Med phoned in °

## 2012-12-01 ENCOUNTER — Other Ambulatory Visit: Payer: Self-pay | Admitting: Family Medicine

## 2012-12-01 NOTE — Telephone Encounter (Signed)
?   OK to Refill  

## 2012-12-01 NOTE — Telephone Encounter (Signed)
ok 

## 2012-12-02 NOTE — Telephone Encounter (Signed)
Meds refilled.

## 2013-01-02 ENCOUNTER — Other Ambulatory Visit: Payer: Self-pay | Admitting: Family Medicine

## 2013-01-14 ENCOUNTER — Ambulatory Visit (INDEPENDENT_AMBULATORY_CARE_PROVIDER_SITE_OTHER): Payer: Medicare Other | Admitting: Family Medicine

## 2013-01-14 DIAGNOSIS — Z23 Encounter for immunization: Secondary | ICD-10-CM

## 2013-02-06 ENCOUNTER — Other Ambulatory Visit: Payer: Self-pay | Admitting: Family Medicine

## 2013-02-24 ENCOUNTER — Other Ambulatory Visit: Payer: Self-pay | Admitting: Family Medicine

## 2013-02-24 NOTE — Telephone Encounter (Signed)
Last RF 9/6 #30   Last OV 8/25  OK refill?

## 2013-04-01 ENCOUNTER — Other Ambulatory Visit: Payer: Self-pay | Admitting: Family Medicine

## 2013-04-16 ENCOUNTER — Telehealth: Payer: Self-pay | Admitting: Family Medicine

## 2013-04-16 NOTE — Telephone Encounter (Signed)
?   OK to Refill  

## 2013-04-16 NOTE — Telephone Encounter (Signed)
ok 

## 2013-04-16 NOTE — Telephone Encounter (Signed)
Pt is needing a refill on her [place] is [facility] in [place] Call back number is [phone]

## 2013-04-17 MED ORDER — CYCLOBENZAPRINE HCL 10 MG PO TABS: ORAL_TABLET | ORAL | Status: DC

## 2013-04-17 NOTE — Telephone Encounter (Signed)
Rx Refilled  

## 2013-06-10 ENCOUNTER — Encounter: Payer: Self-pay | Admitting: Family Medicine

## 2013-06-10 ENCOUNTER — Other Ambulatory Visit: Payer: Self-pay | Admitting: Family Medicine

## 2013-06-10 NOTE — Telephone Encounter (Signed)
Medication refill for one time only.  Patient needs to be seen.  Letter sent for patient to call and schedule 

## 2013-06-24 ENCOUNTER — Ambulatory Visit: Payer: Medicare Other | Admitting: Family Medicine

## 2013-06-30 ENCOUNTER — Other Ambulatory Visit: Payer: Medicare Other

## 2013-06-30 ENCOUNTER — Other Ambulatory Visit: Payer: Self-pay | Admitting: Family Medicine

## 2013-06-30 DIAGNOSIS — E785 Hyperlipidemia, unspecified: Secondary | ICD-10-CM

## 2013-06-30 DIAGNOSIS — Z79899 Other long term (current) drug therapy: Secondary | ICD-10-CM

## 2013-06-30 LAB — CBC WITH DIFFERENTIAL/PLATELET
BASOS ABS: 0 10*3/uL (ref 0.0–0.1)
BASOS PCT: 0 % (ref 0–1)
Eosinophils Absolute: 0.7 10*3/uL (ref 0.0–0.7)
Eosinophils Relative: 9 % — ABNORMAL HIGH (ref 0–5)
HCT: 42.4 % (ref 36.0–46.0)
Hemoglobin: 14.8 g/dL (ref 12.0–15.0)
LYMPHS ABS: 1.8 10*3/uL (ref 0.7–4.0)
Lymphocytes Relative: 22 % (ref 12–46)
MCH: 29.8 pg (ref 26.0–34.0)
MCHC: 34.9 g/dL (ref 30.0–36.0)
MCV: 85.3 fL (ref 78.0–100.0)
MONO ABS: 0.7 10*3/uL (ref 0.1–1.0)
MONOS PCT: 8 % (ref 3–12)
Neutro Abs: 5.1 10*3/uL (ref 1.7–7.7)
Neutrophils Relative %: 61 % (ref 43–77)
Platelets: 377 10*3/uL (ref 150–400)
RBC: 4.97 MIL/uL (ref 3.87–5.11)
RDW: 14.7 % (ref 11.5–15.5)
WBC: 8.3 10*3/uL (ref 4.0–10.5)

## 2013-06-30 LAB — LIPID PANEL
Cholesterol: 206 mg/dL — ABNORMAL HIGH (ref 0–200)
HDL: 58 mg/dL (ref >39)
LDL CALC: 96 mg/dL (ref 0–99)
Total CHOL/HDL Ratio: 3.6 Ratio
Triglycerides: 261 mg/dL — ABNORMAL HIGH (ref <150)
VLDL: 52 mg/dL — ABNORMAL HIGH (ref 0–40)

## 2013-06-30 LAB — COMPREHENSIVE METABOLIC PANEL
ALBUMIN: 4.1 g/dL (ref 3.5–5.2)
ALT: 62 U/L — ABNORMAL HIGH (ref 0–35)
AST: 28 U/L (ref 0–37)
Alkaline Phosphatase: 88 U/L (ref 39–117)
BUN: 17 mg/dL (ref 6–23)
CHLORIDE: 102 meq/L (ref 96–112)
CO2: 29 mEq/L (ref 19–32)
Calcium: 9.8 mg/dL (ref 8.4–10.5)
Creat: 1.29 mg/dL — ABNORMAL HIGH (ref 0.50–1.10)
Glucose, Bld: 102 mg/dL — ABNORMAL HIGH (ref 70–99)
POTASSIUM: 4.9 meq/L (ref 3.5–5.3)
SODIUM: 142 meq/L (ref 135–145)
TOTAL PROTEIN: 6.7 g/dL (ref 6.0–8.3)
Total Bilirubin: 0.5 mg/dL (ref 0.2–1.2)

## 2013-07-02 ENCOUNTER — Encounter: Payer: Self-pay | Admitting: *Deleted

## 2013-07-02 ENCOUNTER — Encounter: Payer: Self-pay | Admitting: Family Medicine

## 2013-07-02 ENCOUNTER — Ambulatory Visit (INDEPENDENT_AMBULATORY_CARE_PROVIDER_SITE_OTHER): Payer: Medicare Other | Admitting: Family Medicine

## 2013-07-02 VITALS — BP 120/64 | HR 92 | Temp 97.0°F | Resp 18 | Ht 61.0 in | Wt 175.0 lb

## 2013-07-02 DIAGNOSIS — Z23 Encounter for immunization: Secondary | ICD-10-CM

## 2013-07-02 DIAGNOSIS — Z79899 Other long term (current) drug therapy: Secondary | ICD-10-CM

## 2013-07-02 DIAGNOSIS — R945 Abnormal results of liver function studies: Secondary | ICD-10-CM

## 2013-07-02 DIAGNOSIS — B372 Candidiasis of skin and nail: Secondary | ICD-10-CM

## 2013-07-02 DIAGNOSIS — Z0000 Encounter for general adult medical examination without abnormal findings: Secondary | ICD-10-CM

## 2013-07-02 DIAGNOSIS — R7989 Other specified abnormal findings of blood chemistry: Secondary | ICD-10-CM

## 2013-07-02 LAB — HEPATITIS PANEL, ACUTE
HCV AB: NEGATIVE
Hep A IgM: NONREACTIVE
Hep B C IgM: NONREACTIVE
Hepatitis B Surface Ag: NEGATIVE

## 2013-07-02 MED ORDER — CLOTRIMAZOLE-BETAMETHASONE 1-0.05 % EX CREA: 1.0000 "application " | TOPICAL_CREAM | Freq: Two times a day (BID) | CUTANEOUS | Status: DC

## 2013-07-02 NOTE — Addendum Note (Signed)
Addended by: Shary Decamp B on: 07/02/2013 12:28 PM   Modules accepted: Orders

## 2013-07-02 NOTE — Progress Notes (Signed)
Subjective:    Patient ID: [name], female    DOB: [birth year], 80 y.o.   MRN: [id number]  HPI Subjective:   Patient presents for Medicare Annual/Subsequent preventive examination.   Her most recent labwork is listed below: Appointment on 06/30/2013  Component Date Value Ref Range Status  . WBC 06/30/2013 8.3  4.0 - 10.5 K/uL Final  . RBC 06/30/2013 4.97  3.87 - 5.11 MIL/uL Final  . Hemoglobin 06/30/2013 14.8  12.0 - 15.0 g/dL Final  . HCT 06/30/2013 42.4  36.0 - 46.0 % Final  . MCV 06/30/2013 85.3  78.0 - 100.0 fL Final  . MCH 06/30/2013 29.8  26.0 - 34.0 pg Final  . MCHC 06/30/2013 34.9  30.0 - 36.0 g/dL Final  . RDW 06/30/2013 14.7  11.5 - 15.5 % Final  . Platelets 06/30/2013 377  150 - 400 K/uL Final  . Neutrophils Relative % 06/30/2013 61  43 - 77 % Final  . Neutro Abs 06/30/2013 5.1  1.7 - 7.7 K/uL Final  . Lymphocytes Relative 06/30/2013 22  12 - 46 % Final  . Lymphs Abs 06/30/2013 1.8  0.7 - 4.0 K/uL Final  . Monocytes Relative 06/30/2013 8  3 - 12 % Final  . Monocytes Absolute 06/30/2013 0.7  0.1 - 1.0 K/uL Final  . Eosinophils Relative 06/30/2013 9* 0 - 5 % Final  . Eosinophils Absolute 06/30/2013 0.7  0.0 - 0.7 K/uL Final  . Basophils Relative 06/30/2013 0  0 - 1 % Final  . Basophils Absolute 06/30/2013 0.0  0.0 - 0.1 K/uL Final  . Smear Review 06/30/2013 Criteria for review not met   Final  . Sodium 06/30/2013 142  135 - 145 mEq/L Final  . Potassium 06/30/2013 4.9  3.5 - 5.3 mEq/L Final  . Chloride 06/30/2013 102  96 - 112 mEq/L Final  . CO2 06/30/2013 29  19 - 32 mEq/L Final  . Glucose, Bld 06/30/2013 102* 70 - 99 mg/dL Final  . BUN 06/30/2013 17  6 - 23 mg/dL Final  . Creat 06/30/2013 1.29* 0.50 - 1.10 mg/dL Final  . Total Bilirubin 06/30/2013 0.5  0.2 - 1.2 mg/dL Final  . Alkaline Phosphatase 06/30/2013 88  39 - 117 U/L Final  . AST 06/30/2013 28  0 - 37 U/L Final  . ALT 06/30/2013 62* 0 - 35 U/L Final  . Total Protein 06/30/2013 6.7  6.0 - 8.3 g/dL  Final  . Albumin 06/30/2013 4.1  3.5 - 5.2 g/dL Final  . Calcium 06/30/2013 9.8  8.4 - 10.5 mg/dL Final  . Cholesterol 06/30/2013 206* 0 - 200 mg/dL Final   Comment: ATP III Classification:                                < 200        mg/dL        Desirable                               200 - 239     mg/dL        Borderline High                               >= 240        mg/dL  High                             . Triglycerides 06/30/2013 261* <150 mg/dL Final  . HDL 06/30/2013 58  >39 mg/dL Final  . Total CHOL/HDL Ratio 06/30/2013 3.6   Final  . VLDL 06/30/2013 52* 0 - 40 mg/dL Final  . LDL Cholesterol 06/30/2013 96  0 - 99 mg/dL Final   Comment:                            Total Cholesterol/HDL Ratio:CHD Risk                                                 Coronary Heart Disease Risk Table                                                                 Men       Women                                   1/2 Average Risk              3.4        3.3                                       Average Risk              5.0        4.4                                    2X Average Risk              9.6        7.1                                    3X Average Risk             23.4       11.0                          Use the calculated Patient Ratio above and the CHD Risk table                           to determine the patient's CHD Risk.                          ATP III Classification (LDL):                                <  100        mg/dL         Optimal                               100 - 129     mg/dL         Near or Above Optimal                               130 - 159     mg/dL         Borderline High                               160 - 189     mg/dL         High                                > 190        mg/dL         Very High                              Labs are significant for mild liver irritation.  Patient also has a rash underneath her breasts she like me to evaluate. She's not  had a colonoscopy since 2003. She has not had a mammogram in more than 2 years. She's not had a Pap smear in several years. However she is over age 80 and she has no history of any abnormal Pap smears. By recommendation she now requires them. She had Pneumovax in 2009. She has never had the shingles vaccine. Past Medical History  Diagnosis Date  . Hypercholesterolemia   . Hypertension   . Mitral valve prolapse   . Mild acid reflux   . Staph infection      history of a wound infection following a previous right hip surgery  . CKD (chronic kidney disease) stage 3, GFR 30-59 ml/min   . Spinal stenosis    Past Surgical History  Procedure Laterality Date  . Right breast lumpectomy    . Left total hip replacement arthroplasty  June of 2001  . Right total hip replacement arthroplasty  August of 2001  . Abcess drainage     Current Outpatient Prescriptions on File Prior to Visit  Medication Sig Dispense Refill  . amLODipine-benazepril (LOTREL) 10-20 MG per capsule TAKE 1 CAPSULE DAILY  90 capsule  0  . atenolol (TENORMIN) 50 MG tablet Take 1 tablet (50 mg total) by mouth daily.  90 tablet  3  . Biotin 2500 MCG CAPS Take 2,500 mg by mouth daily.      . butalbital-aspirin-caffeine (FIORINAL) 50-325-40 MG per tablet TAKE ONE TABLET BY MOUTH EVERY 8 HOURS AS NEEDED FOR HEADACHE  30 tablet  0  . citalopram (CELEXA) 20 MG tablet TAKE 1 TABLET DAILY  90 tablet  0  . cyclobenzaprine (FLEXERIL) 10 MG tablet TAKE ONE TABLET BY MOUTH EVERY 8 HOURS AS NEEDED  90 tablet  0  . cyclobenzaprine (FLEXERIL) 10 MG tablet TAKE ONE TABLET BY MOUTH EVERY 8 HOURS AS NEEDED  90 tablet  0  . fish oil-omega-3 fatty acids 1000 MG capsule Take 2 g by  mouth daily.      . Multiple Vitamin (MULTIVITAMIN) capsule Take 1 capsule by mouth daily.      . pravastatin (PRAVACHOL) 40 MG tablet TAKE 1 TABLET AT BEDTIME  90 tablet  3  . Pyridoxine HCl (VITAMIN B-6) 250 MG tablet Take 250 mg by mouth daily.      . silver sulfADIAZINE  (SILVADENE) 1 % cream Apply 1 application topically as needed.  50 g  0  . Thiamine HCl (VITAMIN B-1) 250 MG tablet Take 250 mg by mouth daily.      . vitamin B-12 (CYANOCOBALAMIN) 500 MCG tablet Take 500 mcg by mouth daily.       No current facility-administered medications on file prior to visit.   Allergies  Allergen Reactions  . Meperidine Hcl    History   Social History  . Marital Status: Widowed    Spouse Name: N/A    Number of Children: N/A  . Years of Education: N/A   Occupational History  . Not on file.   Social History Main Topics  . Smoking status: Never Smoker   . Smokeless tobacco: Never Used  . Alcohol Use: No  . Drug Use: No  . Sexual Activity: No     Comment: widowed   Other Topics Concern  . Not on file   Social History Narrative  . No narrative on file   History reviewed. No pertinent family history.      Review of Systems  All other systems reviewed and are negative.      Objective:   Physical Exam  Vitals reviewed. Constitutional: She is oriented to person, place, and time. She appears well-developed and well-nourished. No distress.  HENT:  Head: Normocephalic and atraumatic.  Right Ear: External ear normal.  Left Ear: External ear normal.  Nose: Nose normal.  Mouth/Throat: Oropharynx is clear and moist. No oropharyngeal exudate.  Eyes: Conjunctivae and EOM are normal. Pupils are equal, round, and reactive to light. Right eye exhibits no discharge. Left eye exhibits no discharge. No scleral icterus.  Neck: Normal range of motion. Neck supple. No JVD present. No tracheal deviation present. No thyromegaly present.  Cardiovascular: Normal rate, regular rhythm, normal heart sounds and intact distal pulses.  Exam reveals no gallop and no friction rub.   No murmur heard. Pulmonary/Chest: Effort normal and breath sounds normal. No stridor. No respiratory distress. She has no wheezes. She has no rales. She exhibits no tenderness.  Abdominal:  Soft. Bowel sounds are normal. She exhibits no distension and no mass. There is no tenderness. There is no rebound and no guarding.  Musculoskeletal: Normal range of motion. She exhibits no edema and no tenderness.  Lymphadenopathy:    She has no cervical adenopathy.  Neurological: She is alert and oriented to person, place, and time. She has normal reflexes. She displays normal reflexes. No cranial nerve deficit. She exhibits normal muscle tone. Coordination normal.  Skin: Skin is warm. No rash noted. She is not diaphoretic. No erythema. No pallor.  Psychiatric: She has a normal mood and affect. Her behavior is normal. Judgment and thought content normal.   Rash under breasts appears to be intertrigo which I will treat with lotrisone bid for 14 days.       Assessment & Plan:  1. Routine general medical examination at a health care facility Physical exam is normal. I will schedule the patient for colonoscopy as well as mammogram. I recommended Prevnar 13 today in the office the patient. -  MM Digital Screening; Future - Ambulatory referral to Gastroenterology  2. Encounter for long-term (current) use of other medications Patient's blood pressure and renal function are stable. Continue current medications at their present dosages. Cholesterol is excellent. However I'm a temporary discontinue the statin to see if her liver function tests improved wants another results of her viral hepatitis panel.  3. Elevated LFTs Adenopathy is panel. I suspect a combination of Tylenol and statin medications are likely causing the mild elevation in her LFTs. - Hepatitis panel, acute

## 2013-07-02 NOTE — Addendum Note (Signed)
Addended by: WRAY, [place] on: 07/02/2013 12:42 PM   Modules accepted: Orders

## 2013-07-06 ENCOUNTER — Telehealth: Payer: Self-pay | Admitting: Family Medicine

## 2013-07-21 ENCOUNTER — Telehealth: Payer: Self-pay | Admitting: *Deleted

## 2013-07-21 NOTE — Telephone Encounter (Signed)
I called pt. She said she had office visit with Dr. Dennard Schaumann recently and he advised her to have a colonoscopy.  I have not received a referral from him.   She is not having any rectal bleeding or problems.   She is on recall for 2018 with Dr. Oneida Alar.  I reviewed her labs of 06/2013 and she does have some elevated LFT's.   Scheduled an OV for her with Laban Emperor, NP on 08/24/2013 at 2:30 AM.

## 2013-07-21 NOTE — Telephone Encounter (Signed)
Pt called stating Dr. Dennard Schaumann wants her to have a colonoscopy, and [facility] said she is on a recall with Dr. Oneida Alar for 2018, pt is concerned if she needs to go ahead and have her colonoscopy or does she need to wait until 2018. Per pt Dr. Dennard Schaumann sent over a referral. Please advise [phone]

## 2013-07-21 NOTE — Telephone Encounter (Signed)
Noted  

## 2013-07-21 NOTE — Telephone Encounter (Signed)
Routing to Anna for FYI 

## 2013-08-03 ENCOUNTER — Encounter: Payer: Self-pay | Admitting: Family Medicine

## 2013-08-24 ENCOUNTER — Ambulatory Visit: Payer: Medicare Other | Admitting: Gastroenterology

## 2013-09-07 ENCOUNTER — Other Ambulatory Visit: Payer: Self-pay | Admitting: Family Medicine

## 2013-09-24 ENCOUNTER — Telehealth: Payer: Self-pay | Admitting: Gastroenterology

## 2013-09-24 ENCOUNTER — Encounter: Payer: Self-pay | Admitting: Gastroenterology

## 2013-09-24 ENCOUNTER — Encounter (INDEPENDENT_AMBULATORY_CARE_PROVIDER_SITE_OTHER): Payer: Self-pay

## 2013-09-24 ENCOUNTER — Ambulatory Visit (INDEPENDENT_AMBULATORY_CARE_PROVIDER_SITE_OTHER): Payer: Medicare Other | Admitting: Gastroenterology

## 2013-09-24 VITALS — BP 130/78 | HR 80 | Temp 97.2°F | Ht 61.0 in | Wt 177.2 lb

## 2013-09-24 DIAGNOSIS — R945 Abnormal results of liver function studies: Secondary | ICD-10-CM

## 2013-09-24 DIAGNOSIS — Z1211 Encounter for screening for malignant neoplasm of colon: Secondary | ICD-10-CM

## 2013-09-24 DIAGNOSIS — R7989 Other specified abnormal findings of blood chemistry: Secondary | ICD-10-CM

## 2013-09-24 NOTE — Assessment & Plan Note (Signed)
No lower GI symptoms, last colonoscopy in 2008. Without family history of colon cancer or personal polyps, due for routine screening in 2018. Recommend annual ifobt.

## 2013-09-24 NOTE — Assessment & Plan Note (Signed)
Isolated mild elevation of ALT. Likely fatty liver/drug effect. Recheck in September. Followed by PCP.

## 2013-09-24 NOTE — Patient Instructions (Signed)
Your next colonoscopy will be due in 2018.  Let's draw an updated set of liver numbers in September; we will send the orders to you!

## 2013-09-24 NOTE — Telephone Encounter (Signed)
Please have patient complete an ifobt. After further review of her chart, this will be the most thorough approach. If negative, next colonoscopy 2018. If positive, would offer now.

## 2013-09-24 NOTE — Progress Notes (Signed)
Referring Provider: Susy Frizzle, MD Primary Care Physician:  Odette Fraction, MD Primary GI: Dr. Oneida Alar   Chief Complaint  Patient presents with  . Colonoscopy    HPI:   [name] presents today at the request of Dr. Dennard Schaumann to evaluate if screening colonoscopy is needed. States she will have occasional constipation but will take prunes or eat corn with great results. Doesn't want to take any medications for it. No rectal bleeding. No melena. No abdominal pain. No N/V. BPE in May 2013 with mild to moderate esophageal dysmotility. No evidence of mass or stricture. No dysphagia. Has hiccups occasionally, usually related to food choices.  Last colonoscopy in 2008. No known history of polyps.    Past Medical History  Diagnosis Date  . Hypercholesterolemia   . Hypertension   . Mitral valve prolapse   . Mild acid reflux   . Staph infection      history of a wound infection following a previous right hip surgery  . CKD (chronic kidney disease) stage 3, GFR 30-59 ml/min   . Spinal stenosis     Past Surgical History  Procedure Laterality Date  . Right breast lumpectomy    . Left total hip replacement arthroplasty  June of 2001  . Right total hip replacement arthroplasty  August of 2001  . Abcess drainage    . Colonoscopy  04/30/01    MWN:[id number] hemorrhoids, otherwise, normal rectum/colon/Normal terminal ileum  . Esophagogastroduodenoscopy    03/01/2007    YQI:HKVQQV esophagus without evidence of Barrett's, mass/Mild erythema in the antrum.  Distorted pylorus consistent with prior ulcer disease.  Biopsies obtained/ 3. Junction of D1 and D2 erythematous, edematous, and strictured.  The lumen was narrowed to approximately 10 mm.   . Colonoscopy  03/02/2007    ZDG:LOVF sigmoid colon diverticula.  Otherwise no polyps, masses/Normal terminal ileum.  Approximately 10-15 cm of the distal terminal ileum visualized/normal view of the rectum  . Total knee arthroplasty       Current Outpatient Prescriptions  Medication Sig Dispense Refill  . amLODipine-benazepril (LOTREL) 10-20 MG per capsule Take 1 capsule by mouth daily.  90 capsule  3  . Biotin 2500 MCG CAPS Take 2,500 mg by mouth daily.      . citalopram (CELEXA) 20 MG tablet Take 1 tablet (20 mg total) by mouth daily.  90 tablet  3  . fish oil-omega-3 fatty acids 1000 MG capsule Take 2 g by mouth daily.      . Multiple Vitamin (MULTIVITAMIN) capsule Take 1 capsule by mouth daily.      . pravastatin (PRAVACHOL) 40 MG tablet TAKE 1 TABLET AT BEDTIME  90 tablet  3  . Pyridoxine HCl (VITAMIN B-6) 250 MG tablet Take 250 mg by mouth daily.      . Thiamine HCl (VITAMIN B-1) 250 MG tablet Take 250 mg by mouth daily.      . vitamin B-12 (CYANOCOBALAMIN) 500 MCG tablet Take 500 mcg by mouth daily.      . butalbital-aspirin-caffeine (FIORINAL) 50-325-40 MG per tablet TAKE ONE TABLET BY MOUTH EVERY 8 HOURS AS NEEDED FOR HEADACHE  30 tablet  0  . clotrimazole-betamethasone (LOTRISONE) cream Apply 1 application topically 2 (two) times daily.  30 g  0   No current facility-administered medications for this visit.    Allergies as of 09/24/2013 - Review Complete 07/02/2013  Allergen Reaction Noted  . Demerol [meperidine]  09/24/2013  . Meperidine hcl  08/13/2008  Family History  Problem Relation Age of Onset  . Colon cancer Neg Hx     History   Social History  . Marital Status: Widowed    Spouse Name: N/A    Number of Children: N/A  . Years of Education: N/A   Social History Main Topics  . Smoking status: Never Smoker   . Smokeless tobacco: Never Used  . Alcohol Use: No  . Drug Use: No  . Sexual Activity: No     Comment: widowed   Other Topics Concern  . None   Social History Narrative  . None    Review of Systems: Gen: see HPI CV: Denies chest pain, palpitations, syncope, peripheral edema, and claudication. Resp: +DOE GI: see HPI Derm: Denies rash, itching, dry skin Psych: Denies  depression, anxiety, memory loss, confusion. No homicidal or suicidal ideation.  MSK: lower back pain, radiates down bilateral legs Heme: Denies bruising, bleeding, and enlarged lymph nodes.  Physical Exam: BP 130/78  Pulse 80  Temp(Src) 97.2 F (36.2 C) (Oral)  Ht 5\' 1"  (1.549 m)  Wt 177 lb 3.2 oz (80.377 kg)  BMI 33.50 kg/m2 General:   Alert and oriented. No distress noted. Pleasant and cooperative.  Head:  Normocephalic and atraumatic. Eyes:  Conjuctiva clear without scleral icterus. Mouth:  Oral mucosa pink and moist. Good dentition. No lesions. Heart:  S1, S2 present without murmurs, rubs, or gallops. Regular rate and rhythm. Abdomen:  +BS, soft, non-tender and non-distended. No rebound or guarding. No HSM or masses noted. Msk:  Symmetrical without gross deformities. Normal posture. Extremities:  Without edema. Neurologic:  Alert and  oriented x4;  grossly normal neurologically. Skin:  Intact without significant lesions or rashes. Psych:  Alert and cooperative. Normal mood and affect.  Lab Results  Component Value Date   ALT 62* 06/30/2013   AST 28 06/30/2013   ALKPHOS 88 06/30/2013   BILITOT 0.5 06/30/2013

## 2013-09-25 NOTE — Telephone Encounter (Signed)
LMOM to call. ( Does pt want to come by to pick up iFOBT or want [place] to mail it to her).

## 2013-09-28 ENCOUNTER — Other Ambulatory Visit: Payer: Self-pay

## 2013-09-28 DIAGNOSIS — R7989 Other specified abnormal findings of blood chemistry: Secondary | ICD-10-CM

## 2013-09-28 DIAGNOSIS — R945 Abnormal results of liver function studies: Principal | ICD-10-CM

## 2013-09-28 NOTE — Telephone Encounter (Signed)
I called and told pt and she would like for me to mail the iFOBT to her. I reviewed the instructions with her and told her to call if she has questions. She will do it and mail it back in.   Mailing the iFOBT today.

## 2013-09-28 NOTE — Progress Notes (Signed)
cc'd to pcp 

## 2013-10-05 ENCOUNTER — Ambulatory Visit (INDEPENDENT_AMBULATORY_CARE_PROVIDER_SITE_OTHER): Payer: Medicare Other

## 2013-10-05 DIAGNOSIS — Z1211 Encounter for screening for malignant neoplasm of colon: Secondary | ICD-10-CM

## 2013-10-06 LAB — IFOBT (OCCULT BLOOD): IFOBT: NEGATIVE

## 2013-10-06 NOTE — Progress Notes (Signed)
IFOBT Test Results (-) Negative

## 2013-10-07 NOTE — Progress Notes (Signed)
Quick Note:  ifobt negative. Annual ifobt. Next colonoscopy 2018. ______

## 2013-10-08 ENCOUNTER — Ambulatory Visit: Payer: Medicare Other | Admitting: Family Medicine

## 2013-10-12 ENCOUNTER — Ambulatory Visit (INDEPENDENT_AMBULATORY_CARE_PROVIDER_SITE_OTHER): Payer: Medicare Other | Admitting: Family Medicine

## 2013-10-12 ENCOUNTER — Encounter: Payer: Self-pay | Admitting: Family Medicine

## 2013-10-12 VITALS — BP 118/76 | HR 74 | Temp 97.4°F | Resp 18 | Ht 61.0 in | Wt 174.0 lb

## 2013-10-12 DIAGNOSIS — R609 Edema, unspecified: Secondary | ICD-10-CM

## 2013-10-12 DIAGNOSIS — R600 Localized edema: Secondary | ICD-10-CM

## 2013-10-12 DIAGNOSIS — M48061 Spinal stenosis, lumbar region without neurogenic claudication: Secondary | ICD-10-CM

## 2013-10-12 LAB — COMPLETE METABOLIC PANEL WITH GFR
ALT: 28 U/L (ref 0–35)
AST: 16 U/L (ref 0–37)
Albumin: 4.1 g/dL (ref 3.5–5.2)
Alkaline Phosphatase: 72 U/L (ref 39–117)
BILIRUBIN TOTAL: 0.4 mg/dL (ref 0.2–1.2)
BUN: 21 mg/dL (ref 6–23)
CALCIUM: 10.2 mg/dL (ref 8.4–10.5)
CHLORIDE: 101 meq/L (ref 96–112)
CO2: 27 mEq/L (ref 19–32)
CREATININE: 1.46 mg/dL — AB (ref 0.50–1.10)
GFR, EST AFRICAN AMERICAN: 39 mL/min — AB
GFR, Est Non African American: 34 mL/min — ABNORMAL LOW
Glucose, Bld: 89 mg/dL (ref 70–99)
Potassium: 4.6 mEq/L (ref 3.5–5.3)
Sodium: 138 mEq/L (ref 135–145)
Total Protein: 6.6 g/dL (ref 6.0–8.3)

## 2013-10-12 MED ORDER — LOSARTAN POTASSIUM-HCTZ 50-12.5 MG PO TABS: 1.0000 | ORAL_TABLET | Freq: Every day | ORAL | Status: DC

## 2013-10-12 NOTE — Progress Notes (Signed)
Pt informed

## 2013-10-12 NOTE — Progress Notes (Signed)
Quick Note:  Pt informed ______ 

## 2013-10-12 NOTE — Progress Notes (Signed)
Subjective:    Patient ID: [name], female    DOB: [birth year], 80 y.o.   MRN: [id number]  HPI Patient has a history of spinal stenosis due to degenerative disc disease and spondylosis in the lumbar spine.  Patient had an MRI of the lumbar spine in January 2014 results are dictated below: L1-2: Diffuse bulging annulus, osteophytic ridging and facet  disease but no significant spinal stenosis. There is mild  bilateral lateral recess and foraminal stenosis.  L2-3: Severe degenerative disc disease with a bulging degenerated  annulus, osteophytic spurring and facet disease. There is  bilateral lateral recess stenosis, right greater than left and mild  to moderate moderate bilateral foraminal stenosis.  L3-4: Diffuse bulging degenerated annulus, osteophytic ridging,  short pedicles and facet disease contributing to mild spinal  stenosis, moderate bilateral lateral recess stenosis and mild  bilateral foraminal stenosis, left greater than right.  L4-5: Diffuse bulging degenerated annulus, osteophytic ridging,  short pedicles and facet disease contributing to moderate spinal  and bilateral lateral recess stenosis and mild bilateral foraminal  stenosis, right greater than left.  L5-S1: Focal central disc protrusion, diffuse bulging annulus,  short pedicles and advanced facet disease with mass effect on the  ventral thecal sac and right-sided foraminal stenosis.  IMPRESSION:  Scoliosis and advanced degenerative lumbar spondylosis. There is  multilevel multifactorial spinal, lateral recess and foraminal  stenosis as discussed above at the individual levels.  Her back pain is worsening. She is now having neuropathic pain radiating into both legs. Her left leg worse than her right leg. She is now interested in getting a second opinion regarding treatment. She also is mainly concerned about swelling in both legs. She has +1 pitting edema in both legs to the level of her knees. She denies any  chest pain shortness of breath or dyspnea on exertion. She denies any orthopnea or paroxysmal nocturnal dyspnea. She denies any oliguria. She has no evidence of jaundice or ascites. Past Medical History  Diagnosis Date  . Hypercholesterolemia   . Hypertension   . Mitral valve prolapse   . Mild acid reflux   . Staph infection      history of a wound infection following a previous right hip surgery  . CKD (chronic kidney disease) stage 3, GFR 30-59 ml/min   . Spinal stenosis    Current Outpatient Prescriptions on File Prior to Visit  Medication Sig Dispense Refill  . amLODipine-benazepril (LOTREL) 10-20 MG per capsule Take 1 capsule by mouth daily.  90 capsule  3  . Biotin 2500 MCG CAPS Take 2,500 mg by mouth daily.      . butalbital-aspirin-caffeine (FIORINAL) 50-325-40 MG per tablet TAKE ONE TABLET BY MOUTH EVERY 8 HOURS AS NEEDED FOR HEADACHE  30 tablet  0  . citalopram (CELEXA) 20 MG tablet Take 1 tablet (20 mg total) by mouth daily.  90 tablet  3  . clotrimazole-betamethasone (LOTRISONE) cream Apply 1 application topically 2 (two) times daily.  30 g  0  . fish oil-omega-3 fatty acids 1000 MG capsule Take 2 g by mouth daily.      . Multiple Vitamin (MULTIVITAMIN) capsule Take 1 capsule by mouth daily.      . pravastatin (PRAVACHOL) 40 MG tablet TAKE 1 TABLET AT BEDTIME  90 tablet  3  . Pyridoxine HCl (VITAMIN B-6) 250 MG tablet Take 250 mg by mouth daily.      . Thiamine HCl (VITAMIN B-1) 250 MG tablet Take 250 mg by mouth  daily.      . vitamin B-12 (CYANOCOBALAMIN) 500 MCG tablet Take 500 mcg by mouth daily.       No current facility-administered medications on file prior to visit.   Allergies  Allergen Reactions  . Demerol [Meperidine]   . Meperidine Hcl    History   Social History  . Marital Status: Widowed    Spouse Name: N/A    Number of Children: N/A  . Years of Education: N/A   Occupational History  . Not on file.   Social History Main Topics  . Smoking status:  Never Smoker   . Smokeless tobacco: Never Used  . Alcohol Use: No  . Drug Use: No  . Sexual Activity: No     Comment: widowed   Other Topics Concern  . Not on file   Social History Narrative  . No narrative on file      Review of Systems  All other systems reviewed and are negative.      Objective:   Physical Exam  Vitals reviewed. Cardiovascular: Normal rate, regular rhythm and normal heart sounds.   No murmur heard. Pulmonary/Chest: Effort normal and breath sounds normal. No respiratory distress. She has no wheezes. She has no rales. She exhibits no tenderness.  Abdominal: Soft. Bowel sounds are normal. She exhibits no distension and no mass. There is no tenderness. There is no rebound and no guarding.  Musculoskeletal: She exhibits edema.          Assessment & Plan:  1. Bilateral leg edema Discontinue lotrel.  Scheduled patient for echocardiogram to rule out congestive heart failure or diastolic dysfunction. Check CMP to evaluate for kidney or liver insufficiency.  Replace Lotrel with Hyzaar 50/12.5 one by mouth daily. I anticipate that this will improve the edema and resolve the situation - losartan-hydrochlorothiazide (HYZAAR) 50-12.5 MG per tablet; Take 1 tablet by mouth daily.  Dispense: 30 tablet; Refill: 3 - COMPLETE METABOLIC PANEL WITH GFR - 2D Echocardiogram without contrast; Future  2. Spinal stenosis of lumbar region Consult orthopedic surgery. The patient may require repeat MRI. Hopefully she could benefit from epidural steroid injections. - Ambulatory referral to Orthopedic Surgery

## 2013-10-13 ENCOUNTER — Encounter: Payer: Self-pay | Admitting: Family Medicine

## 2013-10-13 ENCOUNTER — Telehealth: Payer: Self-pay | Admitting: Family Medicine

## 2013-10-13 NOTE — Telephone Encounter (Signed)
[name] from [facility] is calling to let you know that this patient needs precert for echo that she is having please call her back at [phone]

## 2013-10-14 NOTE — Telephone Encounter (Signed)
precert done for echocardiogram with authorization number [id number] and sent over to [facility].

## 2013-10-20 ENCOUNTER — Telehealth: Payer: Self-pay | Admitting: Family Medicine

## 2013-10-20 MED ORDER — ALPRAZOLAM 0.5 MG PO TABS: 0.5000 mg | ORAL_TABLET | Freq: Three times a day (TID) | ORAL | Status: DC

## 2013-10-20 MED ORDER — HYDROCODONE-ACETAMINOPHEN 5-325 MG PO TABS: 1.0000 | ORAL_TABLET | ORAL | Status: DC | PRN

## 2013-10-20 NOTE — Telephone Encounter (Signed)
Pt was wondering if we could write her something for pain and anxiety until she went for her appt with ortho.?  Per Dr. Zackery Barefoot ok to do Vicodin and Xanax.  Pt aware and rx's printed and left up front for pt to pick up.

## 2013-10-22 ENCOUNTER — Other Ambulatory Visit (HOSPITAL_COMMUNITY): Payer: Medicare Other

## 2013-10-23 ENCOUNTER — Encounter (HOSPITAL_COMMUNITY): Payer: Self-pay | Admitting: Family Medicine

## 2013-10-29 ENCOUNTER — Other Ambulatory Visit: Payer: Self-pay

## 2013-10-29 DIAGNOSIS — R7989 Other specified abnormal findings of blood chemistry: Secondary | ICD-10-CM

## 2013-10-29 DIAGNOSIS — R945 Abnormal results of liver function studies: Principal | ICD-10-CM

## 2013-11-03 ENCOUNTER — Other Ambulatory Visit (HOSPITAL_COMMUNITY): Payer: Medicare Other

## 2013-11-06 LAB — HEPATIC FUNCTION PANEL
ALT: 48 U/L — ABNORMAL HIGH (ref 0–35)
AST: 25 U/L (ref 0–37)
Albumin: 4.3 g/dL (ref 3.5–5.2)
Alkaline Phosphatase: 80 U/L (ref 39–117)
BILIRUBIN DIRECT: 0.1 mg/dL (ref 0.0–0.3)
BILIRUBIN INDIRECT: 0.3 mg/dL (ref 0.2–1.2)
BILIRUBIN TOTAL: 0.4 mg/dL (ref 0.2–1.2)
Total Protein: 6.5 g/dL (ref 6.0–8.3)

## 2013-11-09 ENCOUNTER — Other Ambulatory Visit: Payer: Self-pay | Admitting: Family Medicine

## 2013-11-09 NOTE — Telephone Encounter (Signed)
Ok to refill??  Last office visit 10/12/2013.  Last refill 04/17/2013.

## 2013-11-09 NOTE — Telephone Encounter (Signed)
ok 

## 2013-11-09 NOTE — Telephone Encounter (Signed)
Prescription sent to pharmacy.

## 2013-11-10 ENCOUNTER — Ambulatory Visit (HOSPITAL_COMMUNITY): Payer: Medicare Other | Attending: Cardiovascular Disease

## 2013-11-10 DIAGNOSIS — I059 Rheumatic mitral valve disease, unspecified: Secondary | ICD-10-CM | POA: Diagnosis not present

## 2013-11-10 DIAGNOSIS — R609 Edema, unspecified: Secondary | ICD-10-CM

## 2013-11-10 DIAGNOSIS — R600 Localized edema: Secondary | ICD-10-CM

## 2013-11-10 NOTE — Progress Notes (Signed)
2D Echo completed. 11/10/2013

## 2013-11-11 NOTE — Progress Notes (Signed)
Quick Note:  LFTs with only mild elevation in ALT at 48.  I would recheck in 3 months. ______

## 2013-11-12 ENCOUNTER — Encounter: Payer: Self-pay | Admitting: Family Medicine

## 2013-11-12 ENCOUNTER — Telehealth: Payer: Self-pay | Admitting: Gastroenterology

## 2013-11-12 NOTE — Telephone Encounter (Signed)
RECALL FOR LFT 11/2013

## 2013-11-13 ENCOUNTER — Other Ambulatory Visit: Payer: Self-pay

## 2013-11-13 DIAGNOSIS — R945 Abnormal results of liver function studies: Principal | ICD-10-CM

## 2013-11-13 DIAGNOSIS — R7989 Other specified abnormal findings of blood chemistry: Secondary | ICD-10-CM

## 2013-11-13 NOTE — Telephone Encounter (Signed)
Lab orders mailed to pt.

## 2013-11-14 ENCOUNTER — Encounter: Payer: Self-pay | Admitting: *Deleted

## 2013-11-24 ENCOUNTER — Other Ambulatory Visit: Payer: Self-pay | Admitting: Gastroenterology

## 2013-11-24 DIAGNOSIS — R7989 Other specified abnormal findings of blood chemistry: Secondary | ICD-10-CM

## 2013-11-24 DIAGNOSIS — R945 Abnormal results of liver function studies: Principal | ICD-10-CM

## 2013-12-10 LAB — HEPATIC FUNCTION PANEL
ALK PHOS: 80 U/L (ref 39–117)
ALT: 24 U/L (ref 0–35)
AST: 17 U/L (ref 0–37)
Albumin: 3.8 g/dL (ref 3.5–5.2)
BILIRUBIN DIRECT: 0.1 mg/dL (ref 0.0–0.3)
BILIRUBIN TOTAL: 0.4 mg/dL (ref 0.2–1.2)
Indirect Bilirubin: 0.3 mg/dL (ref 0.2–1.2)
Total Protein: 6.1 g/dL (ref 6.0–8.3)

## 2013-12-10 NOTE — Progress Notes (Signed)
Quick Note:  LMOM to call. ______ 

## 2013-12-10 NOTE — Progress Notes (Signed)
Quick Note:  LFTs normal. Recheck yearly. ______

## 2013-12-14 NOTE — Progress Notes (Signed)
Quick Note:  LMOM and letter mailed to pt also. ______

## 2013-12-17 ENCOUNTER — Telehealth: Payer: Self-pay | Admitting: Family Medicine

## 2013-12-17 NOTE — Telephone Encounter (Signed)
Patient is calling to get refill on her hydrocodone if possible  [phone]

## 2013-12-18 MED ORDER — HYDROCODONE-ACETAMINOPHEN 5-325 MG PO TABS: 1.0000 | ORAL_TABLET | ORAL | Status: DC | PRN

## 2013-12-18 NOTE — Telephone Encounter (Signed)
ok 

## 2013-12-18 NOTE — Telephone Encounter (Signed)
?   OK to Refill  

## 2013-12-18 NOTE — Telephone Encounter (Signed)
RX printed, left up front and patient aware to pick up per vm 

## 2014-01-03 ENCOUNTER — Other Ambulatory Visit: Payer: Self-pay | Admitting: Family Medicine

## 2014-01-04 NOTE — Telephone Encounter (Signed)
Prescription sent to pharmacy.

## 2014-01-04 NOTE — Telephone Encounter (Signed)
?   OK to Refill  

## 2014-01-04 NOTE — Telephone Encounter (Signed)
ok 

## 2014-02-02 ENCOUNTER — Other Ambulatory Visit: Payer: Self-pay | Admitting: *Deleted

## 2014-02-02 ENCOUNTER — Encounter: Payer: Self-pay | Admitting: *Deleted

## 2014-02-02 DIAGNOSIS — R7989 Other specified abnormal findings of blood chemistry: Secondary | ICD-10-CM

## 2014-02-02 DIAGNOSIS — R945 Abnormal results of liver function studies: Principal | ICD-10-CM

## 2014-02-27 LAB — HEPATIC FUNCTION PANEL
ALT: 38 U/L — ABNORMAL HIGH (ref 0–35)
AST: 17 U/L (ref 0–37)
Albumin: 4 g/dL (ref 3.5–5.2)
Alkaline Phosphatase: 121 U/L — ABNORMAL HIGH (ref 39–117)
BILIRUBIN DIRECT: 0.1 mg/dL (ref 0.0–0.3)
Indirect Bilirubin: 0.2 mg/dL (ref 0.2–1.2)
Total Bilirubin: 0.3 mg/dL (ref 0.2–1.2)
Total Protein: 6.7 g/dL (ref 6.0–8.3)

## 2014-03-09 NOTE — Progress Notes (Signed)
Quick Note:  Recheck in 6 months. ______

## 2014-03-10 ENCOUNTER — Other Ambulatory Visit: Payer: Self-pay

## 2014-03-10 DIAGNOSIS — R945 Abnormal results of liver function studies: Principal | ICD-10-CM

## 2014-03-10 DIAGNOSIS — R7989 Other specified abnormal findings of blood chemistry: Secondary | ICD-10-CM

## 2014-03-10 NOTE — Progress Notes (Signed)
Quick Note:  LMOM to recheck LFT's in 6 months. ______

## 2014-04-15 ENCOUNTER — Other Ambulatory Visit: Payer: Self-pay | Admitting: Family Medicine

## 2014-04-15 NOTE — Telephone Encounter (Signed)
rx called in

## 2014-04-15 NOTE — Telephone Encounter (Signed)
LRF 02/24/2013 !! #30.  LOV 10/12/13.  Ok refill?

## 2014-04-15 NOTE — Telephone Encounter (Signed)
ok 

## 2014-05-22 ENCOUNTER — Emergency Department (HOSPITAL_COMMUNITY)
Admission: EM | Admit: 2014-05-22 | Discharge: 2014-05-23 | Disposition: A | Discharge status: Home / Self Care | Payer: Medicare Other | Attending: Emergency Medicine | Admitting: Emergency Medicine

## 2014-05-22 ENCOUNTER — Emergency Department (HOSPITAL_COMMUNITY): Payer: Medicare Other

## 2014-05-22 ENCOUNTER — Encounter (HOSPITAL_COMMUNITY): Payer: Self-pay | Admitting: *Deleted

## 2014-05-22 DIAGNOSIS — J40 Bronchitis, not specified as acute or chronic: Secondary | ICD-10-CM | POA: Diagnosis not present

## 2014-05-22 DIAGNOSIS — R000 Tachycardia, unspecified: Secondary | ICD-10-CM | POA: Diagnosis not present

## 2014-05-22 DIAGNOSIS — Z8619 Personal history of other infectious and parasitic diseases: Secondary | ICD-10-CM | POA: Insufficient documentation

## 2014-05-22 DIAGNOSIS — I129 Hypertensive chronic kidney disease with stage 1 through stage 4 chronic kidney disease, or unspecified chronic kidney disease: Secondary | ICD-10-CM | POA: Insufficient documentation

## 2014-05-22 DIAGNOSIS — Z79899 Other long term (current) drug therapy: Secondary | ICD-10-CM | POA: Insufficient documentation

## 2014-05-22 DIAGNOSIS — Z8669 Personal history of other diseases of the nervous system and sense organs: Secondary | ICD-10-CM | POA: Diagnosis not present

## 2014-05-22 DIAGNOSIS — Z8719 Personal history of other diseases of the digestive system: Secondary | ICD-10-CM | POA: Insufficient documentation

## 2014-05-22 DIAGNOSIS — E780 Pure hypercholesterolemia: Secondary | ICD-10-CM | POA: Insufficient documentation

## 2014-05-22 DIAGNOSIS — Z7982 Long term (current) use of aspirin: Secondary | ICD-10-CM | POA: Insufficient documentation

## 2014-05-22 DIAGNOSIS — R0602 Shortness of breath: Secondary | ICD-10-CM | POA: Diagnosis not present

## 2014-05-22 DIAGNOSIS — N183 Chronic kidney disease, stage 3 (moderate): Secondary | ICD-10-CM | POA: Insufficient documentation

## 2014-05-22 MED ORDER — ALBUTEROL SULFATE HFA 108 (90 BASE) MCG/ACT IN AERS
2.0000 | INHALATION_SPRAY | Freq: Once | RESPIRATORY_TRACT | Status: AC
  Administered 2014-05-22: 2 via RESPIRATORY_TRACT
  Filled 2014-05-22: qty 6.7

## 2014-05-22 MED ORDER — IPRATROPIUM-ALBUTEROL 0.5-2.5 (3) MG/3ML IN SOLN
3.0000 mL | Freq: Once | RESPIRATORY_TRACT | Status: AC
  Administered 2014-05-22: 3 mL via RESPIRATORY_TRACT
  Filled 2014-05-22: qty 3

## 2014-05-22 MED ORDER — PREDNISONE 50 MG PO TABS
60.0000 mg | ORAL_TABLET | Freq: Once | ORAL | Status: AC
  Administered 2014-05-22: 60 mg via ORAL
  Filled 2014-05-22 (×2): qty 1

## 2014-05-22 NOTE — ED Notes (Signed)
Pt states she got SOB during the basketball game, pt wheezing & as a congested cough.

## 2014-05-22 NOTE — ED Notes (Signed)
Respiratory therapist stated that the pt became upset and sob during the ballgame tonight. Respiratory states she is wheezing and that she would need to be sent home with an inhaler.

## 2014-05-22 NOTE — ED Provider Notes (Signed)
CSN: [id number]     Arrival date & time 05/22/14  2225 History  This chart was scribed for Merryl Hacker, MD by Eustaquio Maize, ED Scribe. This patient was seen in room APA05/APA05 and the patient's care was started at 11:13 PM.    Chief Complaint  Patient presents with  . Shortness of Breath   The history is provided by the patient. No language interpreter was used.   HPI Comments: Eriyana P Weikel is a 81 y.o. female who presents to the Emergency Department complaining of shortness of breath that began earlier today while watching a basketball game. Pt reports that she had a mild cough all day and that it began acting up during the game. She reports that she has an "asthma attack" before and that these symptoms feel similar. THey happen every couple of years.  Pt does not have an inhaler at home. She reports that she does feel better now after receiving a breathing treatment in the ED. Pt denies fever, chills, chest pain, leg swelling, or any other symptoms. Pt denies smoking cigarettes or drinking EtOH.     Past Medical History  Diagnosis Date  . Hypercholesterolemia   . Hypertension   . Mitral valve prolapse   . Mild acid reflux   . Staph infection      history of a wound infection following a previous right hip surgery  . CKD (chronic kidney disease) stage 3, GFR 30-59 ml/min   . Spinal stenosis    Past Surgical History  Procedure Laterality Date  . Right breast lumpectomy    . Left total hip replacement arthroplasty  June of 2001  . Right total hip replacement arthroplasty  August of 2001  . Abcess drainage    . Colonoscopy  04/30/01    JJH:ERDEYCXK hemorrhoids, otherwise, normal rectum/colon/Normal terminal ileum  . Esophagogastroduodenoscopy    03/01/2007    GYJ:EHUDJS esophagus without evidence of Barrett's, mass/Mild erythema in the antrum.  Distorted pylorus consistent with prior ulcer disease.  Biopsies obtained/ 3. Junction of D1 and D2 erythematous, edematous, and  strictured.  The lumen was narrowed to approximately 10 mm.   . Colonoscopy  03/02/2007    HFW:YOVZ sigmoid colon diverticula.  Otherwise no polyps, masses/Normal terminal ileum.  Approximately 10-15 cm of the distal terminal ileum visualized/normal view of the rectum  . Total knee arthroplasty     Family History  Problem Relation Age of Onset  . Colon cancer Neg Hx    History  Substance Use Topics  . Smoking status: Never Smoker   . Smokeless tobacco: Never Used  . Alcohol Use: No   OB History    No data available     Review of Systems  Constitutional: Negative for fever.  Respiratory: Positive for cough, shortness of breath and wheezing. Negative for chest tightness.   Cardiovascular: Negative for chest pain and leg swelling.  Gastrointestinal: Negative for nausea and vomiting.  Genitourinary: Negative for dysuria.  Skin: Negative for wound.  Neurological: Negative for headaches.  Psychiatric/Behavioral: Negative for confusion.  All other systems reviewed and are negative.     Allergies  Demerol and Meperidine hcl  Home Medications   Prior to Admission medications   Medication Sig Start Date End Date Taking? Authorizing Provider  Biotin 2500 MCG CAPS Take 2,500 mg by mouth daily.   Yes Historical Provider, MD  butalbital-aspirin-caffeine [facility]) 50-325-40 MG per capsule TAKE ONE CAPSULE BY MOUTH EVERY 8 HOURS AS NEEDED FOR HEADACHE 04/15/14  Yes  Susy Frizzle, MD  clotrimazole-betamethasone (LOTRISONE) cream Apply 1 application topically 2 (two) times daily. Patient taking differently: Apply 1 application topically 2 (two) times daily as needed (for irritation).  07/02/13  Yes Susy Frizzle, MD  cyclobenzaprine (FLEXERIL) 5 MG tablet Take 2.5-5 mg by mouth 3 (three) times daily as needed for muscle spasms.   Yes Historical Provider, MD  fish oil-omega-3 fatty acids 1000 MG capsule Take 2 g by mouth daily.   Yes Historical Provider, MD  HYDROcodone-acetaminophen  (NORCO/VICODIN) 5-325 MG per tablet Take 1 tablet by mouth every 4 (four) hours as needed for moderate pain. Patient taking differently: Take 0.5-1 tablets by mouth every 4 (four) hours as needed for moderate pain.  12/18/13  Yes Susy Frizzle, MD  Multiple Vitamin (MULTIVITAMIN) capsule Take 1 capsule by mouth daily.   Yes Historical Provider, MD  vitamin B-12 (CYANOCOBALAMIN) 500 MCG tablet Take 500 mcg by mouth daily.   Yes Historical Provider, MD  albuterol (PROVENTIL HFA;VENTOLIN HFA) 108 (90 BASE) MCG/ACT inhaler Inhale 2 puffs into the lungs every 4 (four) hours as needed for wheezing or shortness of breath. 05/23/14   Merryl Hacker, MD  ALPRAZolam Duanne Moron) 0.5 MG tablet Take 1 tablet (0.5 mg total) by mouth every 8 (eight) hours. Patient not taking: Reported on 05/22/2014 10/20/13   Susy Frizzle, MD  amLODipine-benazepril (LOTREL) 10-20 MG per capsule Take 1 capsule by mouth daily. 09/07/13   Susy Frizzle, MD  citalopram (CELEXA) 20 MG tablet Take 1 tablet (20 mg total) by mouth daily. 09/07/13   Susy Frizzle, MD  losartan-hydrochlorothiazide (HYZAAR) 50-12.5 MG per tablet Take 1 tablet by mouth daily. 10/12/13   Susy Frizzle, MD  pravastatin (PRAVACHOL) 40 MG tablet TAKE 1 TABLET AT BEDTIME Patient not taking: Reported on 05/22/2014 02/06/13   Susy Frizzle, MD  predniSONE (DELTASONE) 20 MG tablet Take 3 tablets (60 mg total) by mouth daily with breakfast. 05/23/14   Merryl Hacker, MD   Triage Vitals: BP 191/90 mmHg  Pulse 105  Temp(Src) 97.8 F (36.6 C) (Oral)  Resp 24  Ht 5\' 3"  (1.6 m)  Wt 160 lb (72.576 kg)  BMI 28.35 kg/m2  SpO2 96%   Physical Exam  Constitutional: She is oriented to person, place, and time. She appears well-developed and well-nourished. No distress.  HENT:  Head: Normocephalic and atraumatic.  Mouth/Throat: Oropharynx is clear and moist.  Neck: Neck supple.  Cardiovascular: Normal rate, regular rhythm and normal heart sounds.   No murmur  heard. Pulmonary/Chest: Effort normal. No respiratory distress. She has wheezes.  Fair air movement, wheezing most prominent in the left upper lobe  Abdominal: Soft. There is no tenderness.  Musculoskeletal: She exhibits no edema.  Neurological: She is alert and oriented to person, place, and time.  Skin: Skin is warm and dry.  Psychiatric: She has a normal mood and affect.  Nursing note and vitals reviewed.   ED Course  Procedures (including critical care time)  DIAGNOSTIC STUDIES: Oxygen Saturation is 96% on RA, normal by my interpretation.    COORDINATION OF CARE: 11:16 PM-Discussed treatment plan which includes CXR with pt at bedside and pt agreed to plan.   Labs Review Labs Reviewed  Randolm Idol, ED    Imaging Review Dg Chest 2 View  05/22/2014   CLINICAL DATA:  Shortness of breath  EXAM: CHEST  2 VIEW  COMPARISON:  03/18/2006  FINDINGS: Normal heart size.  Stable moderate aortic tortuosity.  There is no edema, consolidation, effusion, or pneumothorax.  Partly visible lumbar dextroscoliosis with chronic compression of the L2 and L3 vertebral bodies.  IMPRESSION: No active cardiopulmonary disease.   Electronically Signed   By: Monte Fantasia M.D.   On: 05/22/2014 23:33     EKG Interpretation   Date/Time:  Saturday May 22 2014 22:37:34 EST Ventricular Rate:  103 PR Interval:  210 QRS Duration: 74 QT Interval:  348 QTC Calculation: 455 R Axis:   53 Text Interpretation:  Sinus tachycardia Borderline prolonged PR interval  Biatrial enlargement Low voltage, precordial leads Confirmed by HORTON   MD, COURTNEY ([id number]) on 05/22/2014 10:50:23 PM      MDM   Final diagnoses:  Bronchitis   She presents with shortness of breath and wheezing. Initial vital signs notable for blood pressure 191/90, mild tachycardia, normal pulse oximetry. On my evaluation, patient is comfortable following a DuoNeb treatment. She continues to have expiratory wheezing most prominent over the  left upper lobe. She denies any infectious symptoms. She reports similar symptoms in the past with a questionable history of asthma. EKG and troponin are negative, chest x-ray shows no evidence of consolidation or edema. Patient was able to ambulate and maintain her pulse oximetry greater than 93%. Patient was given prednisone and will be discharged with an [facility] inhaler. Suspect acute bronchitis given recent cough.  Blood pressure improved during hospital stay.  Patient encouraged to continue blood pressure medication as prescribed.  After history, exam, and medical workup I feel the patient has been appropriately medically screened and is safe for discharge home. Pertinent diagnoses were discussed with the patient. Patient was given return precautions.  I personally performed the services described in this documentation, which was scribed in my presence. The recorded information has been reviewed and is accurate.      Merryl Hacker, MD 05/23/14 [phone]

## 2014-05-22 NOTE — ED Notes (Signed)
o2 96 % before walking. When back in room after ambulating April Holding, NT states o2 was 94-96%.

## 2014-05-23 LAB — I-STAT TROPONIN, ED: Troponin i, poc: 0 ng/mL (ref 0.00–0.08)

## 2014-05-23 MED ORDER — ALBUTEROL SULFATE HFA 108 (90 BASE) MCG/ACT IN AERS: 2.0000 | INHALATION_SPRAY | RESPIRATORY_TRACT | Status: DC | PRN

## 2014-05-23 MED ORDER — PREDNISONE 20 MG PO TABS: 60.0000 mg | ORAL_TABLET | Freq: Every day | ORAL | Status: DC

## 2014-05-23 NOTE — Discharge Instructions (Signed)

## 2014-05-25 ENCOUNTER — Other Ambulatory Visit: Payer: Self-pay | Admitting: Family Medicine

## 2014-05-25 MED ORDER — PREDNISONE 20 MG PO TABS: 60.0000 mg | ORAL_TABLET | Freq: Every day | ORAL | Status: DC

## 2014-05-25 MED ORDER — ALBUTEROL SULFATE HFA 108 (90 BASE) MCG/ACT IN AERS: 2.0000 | INHALATION_SPRAY | RESPIRATORY_TRACT | Status: DC | PRN

## 2014-06-14 ENCOUNTER — Telehealth: Payer: Self-pay | Admitting: *Deleted

## 2014-06-14 MED ORDER — ALBUTEROL SULFATE (2.5 MG/3ML) 0.083% IN NEBU: 2.5000 mg | INHALATION_SOLUTION | Freq: Four times a day (QID) | RESPIRATORY_TRACT | Status: DC | PRN

## 2014-06-14 NOTE — Telephone Encounter (Signed)
Received call from [place] at [facility] stating that pt went to ED at [facility] over the weekend and that pt is requesting to have Nebulizer solution sent in to her pharmacy, ?ok to refill neb. Solution?  [place]

## 2014-06-14 NOTE — Telephone Encounter (Signed)
Spoke to [place] and gave her verbal order to go ahead with the nebulizer solution treatment

## 2014-06-14 NOTE — Telephone Encounter (Signed)
ok 

## 2014-07-06 DIAGNOSIS — M5441 Lumbago with sciatica, right side: Secondary | ICD-10-CM | POA: Diagnosis not present

## 2014-07-06 DIAGNOSIS — M4125 Other idiopathic scoliosis, thoracolumbar region: Secondary | ICD-10-CM | POA: Diagnosis not present

## 2014-07-16 DIAGNOSIS — H6122 Impacted cerumen, left ear: Secondary | ICD-10-CM | POA: Diagnosis not present

## 2014-07-16 DIAGNOSIS — H903 Sensorineural hearing loss, bilateral: Secondary | ICD-10-CM | POA: Diagnosis not present

## 2014-08-09 ENCOUNTER — Other Ambulatory Visit: Payer: Self-pay

## 2014-08-09 DIAGNOSIS — R7989 Other specified abnormal findings of blood chemistry: Secondary | ICD-10-CM

## 2014-08-09 DIAGNOSIS — R945 Abnormal results of liver function studies: Principal | ICD-10-CM

## 2014-08-30 DIAGNOSIS — R7989 Other specified abnormal findings of blood chemistry: Secondary | ICD-10-CM | POA: Diagnosis not present

## 2014-08-30 DIAGNOSIS — R799 Abnormal finding of blood chemistry, unspecified: Secondary | ICD-10-CM | POA: Diagnosis not present

## 2014-08-31 ENCOUNTER — Other Ambulatory Visit: Payer: Self-pay

## 2014-08-31 DIAGNOSIS — R7989 Other specified abnormal findings of blood chemistry: Secondary | ICD-10-CM

## 2014-08-31 DIAGNOSIS — R945 Abnormal results of liver function studies: Principal | ICD-10-CM

## 2014-08-31 LAB — HEPATIC FUNCTION PANEL
ALBUMIN: 4.1 g/dL (ref 3.5–5.2)
ALT: 23 U/L (ref 0–35)
AST: 19 U/L (ref 0–37)
Alkaline Phosphatase: 95 U/L (ref 39–117)
BILIRUBIN TOTAL: 0.5 mg/dL (ref 0.2–1.2)
Bilirubin, Direct: 0.1 mg/dL (ref 0.0–0.3)
Indirect Bilirubin: 0.4 mg/dL (ref 0.2–1.2)
Total Protein: 6.6 g/dL (ref 6.0–8.3)

## 2014-08-31 NOTE — Progress Notes (Signed)
Quick Note:  LFTs normal. Repeat twice/year. ______

## 2014-08-31 NOTE — Progress Notes (Signed)
Quick Note:  LMOM to call. Mailing a letter to call also. Lab order on file for 6 months. ______

## 2014-09-08 ENCOUNTER — Telehealth: Payer: Self-pay

## 2014-09-08 ENCOUNTER — Ambulatory Visit (INDEPENDENT_AMBULATORY_CARE_PROVIDER_SITE_OTHER): Payer: Medicare Other

## 2014-09-08 DIAGNOSIS — Z1211 Encounter for screening for malignant neoplasm of colon: Secondary | ICD-10-CM

## 2014-09-08 LAB — IFOBT (OCCULT BLOOD): IFOBT: NEGATIVE

## 2014-09-08 NOTE — Progress Notes (Signed)
Pt return IFOBT test and it was negative 

## 2014-09-08 NOTE — Telephone Encounter (Signed)
Patient called to speak with DS. She received letter. Please call her back at [phone]

## 2014-09-08 NOTE — Telephone Encounter (Signed)
LMOM to call.

## 2014-09-09 NOTE — Telephone Encounter (Signed)
LMOM to call. I had mailed her a letter that her liver tests were normal and she would repeat in 6 months.

## 2014-09-12 NOTE — Progress Notes (Signed)
Quick Note:  ifobt negative. As long as she is not having any lower GI symptoms, would recommend repeat ifobt in 1 year and routine screening colonoscopy in 2018 if health permits. ______

## 2014-09-13 NOTE — Progress Notes (Signed)
Quick Note:  PT is aware. ______ 

## 2014-10-06 DIAGNOSIS — M5442 Lumbago with sciatica, left side: Secondary | ICD-10-CM | POA: Diagnosis not present

## 2014-10-06 DIAGNOSIS — M4806 Spinal stenosis, lumbar region: Secondary | ICD-10-CM | POA: Diagnosis not present

## 2014-10-06 DIAGNOSIS — M5136 Other intervertebral disc degeneration, lumbar region: Secondary | ICD-10-CM | POA: Diagnosis not present

## 2014-10-06 DIAGNOSIS — M5441 Lumbago with sciatica, right side: Secondary | ICD-10-CM | POA: Diagnosis not present

## 2014-10-17 NOTE — Progress Notes (Signed)
REVIEWED-NO ADDITIONAL RECOMMENDATIONS. 

## 2014-10-25 ENCOUNTER — Telehealth: Payer: Self-pay | Admitting: Family Medicine

## 2014-10-25 NOTE — Telephone Encounter (Signed)
Singulair sent back to pharmacy denied.  No record of patient ever taking

## 2014-10-25 NOTE — Telephone Encounter (Signed)
I have no record of her taking this.  Is used for allergies and asthma.  She can take it but why does she need it?

## 2014-10-25 NOTE — Telephone Encounter (Signed)
Pharmacy asking for Singulair. Not on med list. OK for this pt to take?

## 2014-10-27 ENCOUNTER — Ambulatory Visit: Payer: Self-pay | Admitting: Family Medicine

## 2014-11-01 ENCOUNTER — Other Ambulatory Visit: Payer: Self-pay | Admitting: Family Medicine

## 2014-11-01 ENCOUNTER — Ambulatory Visit (INDEPENDENT_AMBULATORY_CARE_PROVIDER_SITE_OTHER): Payer: BC Managed Care – PPO | Admitting: Family Medicine

## 2014-11-01 ENCOUNTER — Encounter: Payer: Self-pay | Admitting: Family Medicine

## 2014-11-01 ENCOUNTER — Other Ambulatory Visit: Payer: Self-pay | Admitting: *Deleted

## 2014-11-01 VITALS — BP 138/80 | HR 94 | Temp 98.0°F | Resp 20 | Wt 156.0 lb

## 2014-11-01 DIAGNOSIS — R0600 Dyspnea, unspecified: Secondary | ICD-10-CM

## 2014-11-01 DIAGNOSIS — Z9109 Other allergy status, other than to drugs and biological substances: Secondary | ICD-10-CM

## 2014-11-01 LAB — CBC WITH DIFFERENTIAL/PLATELET
BASOS ABS: 0.1 10*3/uL (ref 0.0–0.1)
Basophils Relative: 1 % (ref 0–1)
EOS ABS: 1.8 10*3/uL — AB (ref 0.0–0.7)
Eosinophils Relative: 18 % — ABNORMAL HIGH (ref 0–5)
HCT: 44.4 % (ref 36.0–46.0)
HEMOGLOBIN: 14.7 g/dL (ref 12.0–15.0)
LYMPHS ABS: 2.1 10*3/uL (ref 0.7–4.0)
Lymphocytes Relative: 21 % (ref 12–46)
MCH: 28.3 pg (ref 26.0–34.0)
MCHC: 33.1 g/dL (ref 30.0–36.0)
MCV: 85.4 fL (ref 78.0–100.0)
MPV: 9.1 fL (ref 8.6–12.4)
Monocytes Absolute: 0.8 10*3/uL (ref 0.1–1.0)
Monocytes Relative: 8 % (ref 3–12)
NEUTROS ABS: 5.1 10*3/uL (ref 1.7–7.7)
NEUTROS PCT: 52 % (ref 43–77)
PLATELETS: 505 10*3/uL — AB (ref 150–400)
RBC: 5.2 MIL/uL — ABNORMAL HIGH (ref 3.87–5.11)
RDW: 14.6 % (ref 11.5–15.5)
WBC: 9.8 10*3/uL (ref 4.0–10.5)

## 2014-11-01 LAB — COMPLETE METABOLIC PANEL WITH GFR
ALBUMIN: 4.1 g/dL (ref 3.6–5.1)
ALT: 25 U/L (ref 6–29)
AST: 17 U/L (ref 10–35)
Alkaline Phosphatase: 97 U/L (ref 33–130)
BUN: 16 mg/dL (ref 7–25)
CO2: 26 mmol/L (ref 20–31)
CREATININE: 1.42 mg/dL — AB (ref 0.60–0.88)
Calcium: 9.9 mg/dL (ref 8.6–10.4)
Chloride: 101 mmol/L (ref 98–110)
GFR, EST AFRICAN AMERICAN: 40 mL/min — AB (ref >=60)
GFR, Est Non African American: 35 mL/min — ABNORMAL LOW (ref >=60)
Glucose, Bld: 84 mg/dL (ref 70–99)
Potassium: 4.5 mmol/L (ref 3.5–5.3)
Sodium: 139 mmol/L (ref 135–146)
TOTAL PROTEIN: 6.9 g/dL (ref 6.1–8.1)
Total Bilirubin: 0.4 mg/dL (ref 0.2–1.2)

## 2014-11-01 MED ORDER — PREDNISONE 20 MG PO TABS: ORAL_TABLET | ORAL | Status: DC

## 2014-11-01 MED ORDER — MONTELUKAST SODIUM 10 MG PO TABS: 10.0000 mg | ORAL_TABLET | Freq: Every day | ORAL | Status: DC

## 2014-11-01 NOTE — Progress Notes (Signed)
Subjective:    Patient ID: [name], female    DOB: March 19, 1933, 81 y.o.   MRN: [id number]  HPI  Patient has a history of asthma.  She has always controlled it easily with albuterol used sparingly.  Recently, over the last 4 months, she has developed worsening coughing, wheezing, and dyspnea on exertion.  She denies hemoptysis, fever, productive cough, night sweats etc.  Symptoms improve some with albuterol.  On exam todays, she has diminished breath sounds bilaterally and faint wheezing but not significant. Past Medical History  Diagnosis Date  . Hypercholesterolemia   . Hypertension   . Mitral valve prolapse   . Mild acid reflux   . Staph infection      history of a wound infection following a previous right hip surgery  . CKD (chronic kidney disease) stage 3, GFR 30-59 ml/min   . Spinal stenosis    Past Surgical History  Procedure Laterality Date  . Right breast lumpectomy    . Left total hip replacement arthroplasty  June of 2001  . Right total hip replacement arthroplasty  August of 2001  . Abcess drainage    . Colonoscopy  04/30/01    QAS:[id number] hemorrhoids, otherwise, normal rectum/colon/Normal terminal ileum  . Esophagogastroduodenoscopy    03/01/2007    LNL:GXQJJH esophagus without evidence of Barrett's, mass/Mild erythema in the antrum.  Distorted pylorus consistent with prior ulcer disease.  Biopsies obtained/ 3. Junction of D1 and D2 erythematous, edematous, and strictured.  The lumen was narrowed to approximately 10 mm.   . Colonoscopy  03/02/2007    ERD:EYCX sigmoid colon diverticula.  Otherwise no polyps, masses/Normal terminal ileum.  Approximately 10-15 cm of the distal terminal ileum visualized/normal view of the rectum  . Total knee arthroplasty     Current Outpatient Prescriptions on File Prior to Visit  Medication Sig Dispense Refill  . albuterol (PROVENTIL HFA;VENTOLIN HFA) 108 (90 BASE) MCG/ACT inhaler Inhale 2 puffs into the lungs every 4 (four)  hours as needed for wheezing or shortness of breath. 1 Inhaler 0  . albuterol (PROVENTIL) (2.5 MG/3ML) 0.083% nebulizer solution Take 3 mLs (2.5 mg total) by nebulization every 6 (six) hours as needed for wheezing or shortness of breath. 150 mL 0  . ALPRAZolam (XANAX) 0.5 MG tablet Take 1 tablet (0.5 mg total) by mouth every 8 (eight) hours. 30 tablet 0  . amLODipine-benazepril (LOTREL) 10-20 MG per capsule Take 1 capsule by mouth daily. 90 capsule 3  . Biotin 2500 MCG CAPS Take 2,500 mg by mouth daily.    . butalbital-aspirin-caffeine (FIORINAL) 50-325-40 MG per capsule TAKE ONE CAPSULE BY MOUTH EVERY 8 HOURS AS NEEDED FOR HEADACHE 30 capsule 0  . citalopram (CELEXA) 20 MG tablet Take 1 tablet (20 mg total) by mouth daily. 90 tablet 3  . clotrimazole-betamethasone (LOTRISONE) cream Apply 1 application topically 2 (two) times daily. (Patient taking differently: Apply 1 application topically 2 (two) times daily as needed (for irritation). ) 30 g 0  . cyclobenzaprine (FLEXERIL) 5 MG tablet Take 2.5-5 mg by mouth 3 (three) times daily as needed for muscle spasms.    . fish oil-omega-3 fatty acids 1000 MG capsule Take 2 g by mouth daily.    Marland Kitchen HYDROcodone-acetaminophen (NORCO/VICODIN) 5-325 MG per tablet Take 1 tablet by mouth every 4 (four) hours as needed for moderate pain. (Patient taking differently: Take 0.5-1 tablets by mouth every 4 (four) hours as needed for moderate pain. ) 30 tablet 0  . losartan-hydrochlorothiazide (HYZAAR) 50-12.5  MG per tablet Take 1 tablet by mouth daily. 30 tablet 3  . Multiple Vitamin (MULTIVITAMIN) capsule Take 1 capsule by mouth daily.    . pravastatin (PRAVACHOL) 40 MG tablet TAKE 1 TABLET AT BEDTIME 90 tablet 3  . vitamin B-12 (CYANOCOBALAMIN) 500 MCG tablet Take 500 mcg by mouth daily.     No current facility-administered medications on file prior to visit.   Allergies  Allergen Reactions  . Demerol [Meperidine] Other (See Comments)    Altered mental status  .  Meperidine Hcl Other (See Comments)    Altered mental status   Social History   Social History  . Marital Status: Widowed    Spouse Name: N/A  . Number of Children: N/A  . Years of Education: N/A   Occupational History  . Not on file.   Social History Main Topics  . Smoking status: Never Smoker   . Smokeless tobacco: Never Used  . Alcohol Use: No  . Drug Use: No  . Sexual Activity: No     Comment: widowed   Other Topics Concern  . Not on file   Social History Narrative     Review of Systems  All other systems reviewed and are negative.      Objective:   Physical Exam  Constitutional: She appears well-developed and well-nourished.  HENT:  Right Ear: External ear normal.  Left Ear: External ear normal.  Nose: Nose normal.  Mouth/Throat: Oropharynx is clear and moist. No oropharyngeal exudate.  Eyes: Conjunctivae are normal.  Neck: Neck supple. No JVD present.  Cardiovascular: Normal rate, regular rhythm and normal heart sounds.   Pulmonary/Chest: Effort normal. No accessory muscle usage. No respiratory distress. She has decreased breath sounds. She has wheezes. She has no rales. She exhibits no tenderness.  Abdominal: Soft. Bowel sounds are normal.  Musculoskeletal: She exhibits no edema.  Lymphadenopathy:    She has no cervical adenopathy.  Vitals reviewed.         Assessment & Plan:  Dyspnea - Plan: CBC with Differential/Platelet, COMPLETE METABOLIC PANEL WITH GFR, DG Chest 2 View PFT's show FEV/FVC of 59 %, with FEV1 1.15 liters 68% of predicted consistent with moderate (stage II) COPD.  She does have prolonged history of second hand smoke exposure.  Begin prednisone taper ack and then use Breo 200/25 inh qday as a preventative,  I will also check a CXR and CBC.

## 2014-11-04 ENCOUNTER — Other Ambulatory Visit: Payer: Self-pay | Admitting: Family Medicine

## 2014-11-04 ENCOUNTER — Other Ambulatory Visit: Payer: Self-pay | Admitting: *Deleted

## 2014-11-04 DIAGNOSIS — Z9109 Other allergy status, other than to drugs and biological substances: Secondary | ICD-10-CM

## 2014-11-26 ENCOUNTER — Encounter: Payer: Self-pay | Admitting: Family Medicine

## 2014-11-29 DIAGNOSIS — C44319 Basal cell carcinoma of skin of other parts of face: Secondary | ICD-10-CM | POA: Diagnosis not present

## 2014-11-29 DIAGNOSIS — C44310 Basal cell carcinoma of skin of unspecified parts of face: Secondary | ICD-10-CM | POA: Diagnosis not present

## 2014-11-29 DIAGNOSIS — C44519 Basal cell carcinoma of skin of other part of trunk: Secondary | ICD-10-CM | POA: Diagnosis not present

## 2014-11-29 DIAGNOSIS — D225 Melanocytic nevi of trunk: Secondary | ICD-10-CM | POA: Diagnosis not present

## 2014-12-03 ENCOUNTER — Ambulatory Visit: Payer: BC Managed Care – PPO | Admitting: Family Medicine

## 2015-01-10 ENCOUNTER — Ambulatory Visit (INDEPENDENT_AMBULATORY_CARE_PROVIDER_SITE_OTHER): Payer: BC Managed Care – PPO | Admitting: Family Medicine

## 2015-01-10 ENCOUNTER — Encounter: Payer: Self-pay | Admitting: Family Medicine

## 2015-01-10 VITALS — BP 160/100 | HR 88 | Temp 98.5°F | Resp 20 | Ht 61.0 in | Wt 163.0 lb

## 2015-01-10 DIAGNOSIS — M4806 Spinal stenosis, lumbar region: Secondary | ICD-10-CM

## 2015-01-10 DIAGNOSIS — M48061 Spinal stenosis, lumbar region without neurogenic claudication: Secondary | ICD-10-CM

## 2015-01-10 DIAGNOSIS — I10 Essential (primary) hypertension: Secondary | ICD-10-CM

## 2015-01-10 DIAGNOSIS — R600 Localized edema: Secondary | ICD-10-CM

## 2015-01-10 MED ORDER — LOSARTAN POTASSIUM-HCTZ 50-12.5 MG PO TABS: 1.0000 | ORAL_TABLET | Freq: Every day | ORAL | Status: DC

## 2015-01-10 MED ORDER — GABAPENTIN 300 MG PO CAPS: 300.0000 mg | ORAL_CAPSULE | Freq: Three times a day (TID) | ORAL | Status: DC

## 2015-01-10 NOTE — Progress Notes (Signed)
Subjective:    Patient ID: [name], female    DOB: 10/06/1932, 81 y.o.   MRN: [id number]  HPI 09/2013 Patient has a history of spinal stenosis due to degenerative disc disease and spondylosis in the lumbar spine.  Patient had an MRI of the lumbar spine in January 2014 results are dictated below: L1-2: Diffuse bulging annulus, osteophytic ridging and facet  disease but no significant spinal stenosis. There is mild  bilateral lateral recess and foraminal stenosis.  L2-3: Severe degenerative disc disease with a bulging degenerated  annulus, osteophytic spurring and facet disease. There is  bilateral lateral recess stenosis, right greater than left and mild  to moderate moderate bilateral foraminal stenosis.  L3-4: Diffuse bulging degenerated annulus, osteophytic ridging,  short pedicles and facet disease contributing to mild spinal  stenosis, moderate bilateral lateral recess stenosis and mild  bilateral foraminal stenosis, left greater than right.  L4-5: Diffuse bulging degenerated annulus, osteophytic ridging,  short pedicles and facet disease contributing to moderate spinal  and bilateral lateral recess stenosis and mild bilateral foraminal  stenosis, right greater than left.  L5-S1: Focal central disc protrusion, diffuse bulging annulus,  short pedicles and advanced facet disease with mass effect on the  ventral thecal sac and right-sided foraminal stenosis.  IMPRESSION:  Scoliosis and advanced degenerative lumbar spondylosis. There is  multilevel multifactorial spinal, lateral recess and foraminal  stenosis as discussed above at the individual levels.  Her back pain is worsening. She is now having neuropathic pain radiating into both legs. Her left leg worse than her right leg. She is now interested in getting a second opinion regarding treatment. She also is mainly concerned about swelling in both legs. She has +1 pitting edema in both legs to the level of her knees. She  denies any chest pain shortness of breath or dyspnea on exertion. She denies any orthopnea or paroxysmal nocturnal dyspnea. She denies any oliguria. She has no evidence of jaundice or ascites.  At that time, my plan was: 1. Bilateral leg edema Discontinue lotrel.  Scheduled patient for echocardiogram to rule out congestive heart failure or diastolic dysfunction. Check CMP to evaluate for kidney or liver insufficiency.  Replace Lotrel with Hyzaar 50/12.5 one by mouth daily. I anticipate that this will improve the edema and resolve the situation - losartan-hydrochlorothiazide (HYZAAR) 50-12.5 MG per tablet; Take 1 tablet by mouth daily.  Dispense: 30 tablet; Refill: 3 - COMPLETE METABOLIC PANEL WITH GFR - 2D Echocardiogram without contrast; Future  2. Spinal stenosis of lumbar region Consult orthopedic surgery. The patient may require repeat MRI. Hopefully she could benefit from epidural steroid injections. - Ambulatory referral to Orthopedic Surgery  01/10/15 Patient is here today to follow-up her blood pressure. She has been off the Hyzaar for more than a week. Her blood pressure today is elevated at 160/100. She also complains of persistent low back pain with aching deep pain in both legs from her thighs down to her feet consistent with neuropathic pain from spinal stenosis. She is currently seeing [facility] orthopedics and receiving Flexeril along with hydrocodone for this. Patient quit her statin medication for probably one year ago. We have not rechecked her cholesterol since Past Medical History  Diagnosis Date  . Hypercholesterolemia   . Hypertension   . Mitral valve prolapse   . Mild acid reflux   . Staph infection      history of a wound infection following a previous right hip surgery  . CKD (chronic kidney disease)  stage 3, GFR 30-59 ml/min   . Spinal stenosis    Current Outpatient Prescriptions on File Prior to Visit  Medication Sig Dispense Refill  . albuterol (PROVENTIL  HFA;VENTOLIN HFA) 108 (90 BASE) MCG/ACT inhaler Inhale 2 puffs into the lungs every 4 (four) hours as needed for wheezing or shortness of breath. 1 Inhaler 0  . albuterol (PROVENTIL) (2.5 MG/3ML) 0.083% nebulizer solution Take 3 mLs (2.5 mg total) by nebulization every 6 (six) hours as needed for wheezing or shortness of breath. 150 mL 0  . Biotin 2500 MCG CAPS Take 2,500 mg by mouth daily.    . butalbital-aspirin-caffeine (FIORINAL) 50-325-40 MG per capsule TAKE ONE CAPSULE BY MOUTH EVERY 8 HOURS AS NEEDED FOR HEADACHE 30 capsule 0  . citalopram (CELEXA) 20 MG tablet TAKE 1 TABLET DAILY 90 tablet 3  . clotrimazole-betamethasone (LOTRISONE) cream Apply 1 application topically 2 (two) times daily. (Patient taking differently: Apply 1 application topically 2 (two) times daily as needed (for irritation). ) 30 g 0  . cyclobenzaprine (FLEXERIL) 5 MG tablet Take 2.5-5 mg by mouth 3 (three) times daily as needed for muscle spasms.    . fish oil-omega-3 fatty acids 1000 MG capsule Take 2 g by mouth daily.    Marland Kitchen HYDROcodone-acetaminophen (NORCO/VICODIN) 5-325 MG per tablet Take 1 tablet by mouth every 4 (four) hours as needed for moderate pain. (Patient taking differently: Take 0.5-1 tablets by mouth every 4 (four) hours as needed for moderate pain. ) 30 tablet 0  . montelukast (SINGULAIR) 10 MG tablet Take 1 tablet (10 mg total) by mouth at bedtime. 30 tablet 3  . Multiple Vitamin (MULTIVITAMIN) capsule Take 1 capsule by mouth daily.    . vitamin B-12 (CYANOCOBALAMIN) 500 MCG tablet Take 500 mcg by mouth daily.     No current facility-administered medications on file prior to visit.   Allergies  Allergen Reactions  . Demerol [Meperidine] Other (See Comments)    Altered mental status  . Meperidine Hcl Other (See Comments)    Altered mental status   Social History   Social History  . Marital Status: Widowed    Spouse Name: N/A  . Number of Children: N/A  . Years of Education: N/A   Occupational  History  . Not on file.   Social History Main Topics  . Smoking status: Never Smoker   . Smokeless tobacco: Never Used  . Alcohol Use: No  . Drug Use: No  . Sexual Activity: No     Comment: widowed   Other Topics Concern  . Not on file   Social History Narrative      Review of Systems  All other systems reviewed and are negative.      Objective:   Physical Exam  Cardiovascular: Normal rate, regular rhythm and normal heart sounds.   No murmur heard. Pulmonary/Chest: Effort normal and breath sounds normal. No respiratory distress. She has no wheezes. She has no rales. She exhibits no tenderness.  Abdominal: Soft. Bowel sounds are normal. She exhibits no distension and no mass. There is no tenderness. There is no rebound and no guarding.  Musculoskeletal: She exhibits edema.  Vitals reviewed.         Assessment & Plan:   Bilateral leg edema - Plan: losartan-hydrochlorothiazide (HYZAAR) 50-12.5 MG tablet, DISCONTINUED: losartan-hydrochlorothiazide (HYZAAR) 50-12.5 MG tablet  Spinal stenosis of lumbar region - Plan: gabapentin (NEURONTIN) 300 MG capsule  Benign essential HTN  Resume Hyzaar 50/12.5, 1 tablet by mouth daily. Recheck blood pressure  in 2 weeks. I would also like the patient to return fasting at that time for a CMP as well as a fasting lipid panel. I want to monitor her kidney function after we resumed the Hyzaar given her chronic kidney disease. Also check a fasting lipid panel given the fact she's been off his statin medication now for over a year. Given the pain she is having in her legs, I will start the patient on gabapentin 300 mg by mouth every 8 hours when necessary nerve pain to see if this will help some of the neuropathic pain coming from her spinal stenosis.

## 2015-01-17 DIAGNOSIS — Z08 Encounter for follow-up examination after completed treatment for malignant neoplasm: Secondary | ICD-10-CM | POA: Diagnosis not present

## 2015-01-17 DIAGNOSIS — Z85828 Personal history of other malignant neoplasm of skin: Secondary | ICD-10-CM | POA: Diagnosis not present

## 2015-01-24 ENCOUNTER — Ambulatory Visit: Payer: BC Managed Care – PPO | Admitting: Family Medicine

## 2015-01-24 VITALS — BP 146/90

## 2015-01-24 DIAGNOSIS — I10 Essential (primary) hypertension: Secondary | ICD-10-CM

## 2015-01-25 ENCOUNTER — Telehealth: Payer: Self-pay | Admitting: Family Medicine

## 2015-01-25 MED ORDER — AMLODIPINE BESYLATE 10 MG PO TABS: 10.0000 mg | ORAL_TABLET | Freq: Every day | ORAL | Status: DC

## 2015-01-25 NOTE — Telephone Encounter (Signed)
-----   Message from Susy Frizzle, MD sent at 01/25/2015  7:54 AM EST ----- BP is better but still not at goal.  Add amlodipine 10 mg poqday to hyzaar.  She needs fasting labs if she hasn't already had them drawn.

## 2015-01-25 NOTE — Telephone Encounter (Signed)
Pt in yesterday for BP readings.  Readings were given to provider.  Per his recommendations RX for Amlodipine 10 mg QD sent to pharmacy.  Pt also needs to have fasting lab work done.  Have left her a message to call back.

## 2015-01-26 NOTE — Telephone Encounter (Signed)
RX was sent to pharmacy yesterday.  Have left another message for pt to call me back.

## 2015-01-31 ENCOUNTER — Other Ambulatory Visit: Payer: Self-pay

## 2015-01-31 DIAGNOSIS — M4125 Other idiopathic scoliosis, thoracolumbar region: Secondary | ICD-10-CM | POA: Diagnosis not present

## 2015-01-31 DIAGNOSIS — G8929 Other chronic pain: Secondary | ICD-10-CM | POA: Diagnosis not present

## 2015-01-31 DIAGNOSIS — M5441 Lumbago with sciatica, right side: Secondary | ICD-10-CM | POA: Diagnosis not present

## 2015-01-31 DIAGNOSIS — R7989 Other specified abnormal findings of blood chemistry: Secondary | ICD-10-CM

## 2015-01-31 DIAGNOSIS — R945 Abnormal results of liver function studies: Principal | ICD-10-CM

## 2015-01-31 DIAGNOSIS — M5442 Lumbago with sciatica, left side: Secondary | ICD-10-CM | POA: Diagnosis not present

## 2015-02-02 NOTE — Telephone Encounter (Signed)
Pt called me back.  Discussed BP med, adding Amlodipine to current meds.  Reminded about coming for fasting lab work.

## 2015-02-07 ENCOUNTER — Other Ambulatory Visit: Payer: Self-pay

## 2015-02-28 ENCOUNTER — Telehealth: Payer: Self-pay | Admitting: Family Medicine

## 2015-02-28 NOTE — Telephone Encounter (Addendum)
Patient is calling to say that dr pickard had discussed prescribing medication to help her with her memory, would like to discuss this further if she could get a call back  [place] DESPERATELY

## 2015-02-28 NOTE — Telephone Encounter (Signed)
I would want to see her to see whats going on.

## 2015-03-01 NOTE — Telephone Encounter (Signed)
LMTRC

## 2015-03-01 NOTE — Telephone Encounter (Signed)
Received call from [place], patient grand-daughter.   States that patient is requesting something for back pain in regards to fall like Lidoderm.   Also reports that patient has requested something to help her focus. States that she will go into a room and forget why she is in there.   Also reports that patient has difficulty going to sleep and is requesting an aide.   Advised that OV is required to discuss with MD. Will call back to schedule appt.

## 2015-03-09 DIAGNOSIS — R799 Abnormal finding of blood chemistry, unspecified: Secondary | ICD-10-CM | POA: Diagnosis not present

## 2015-03-09 DIAGNOSIS — R7989 Other specified abnormal findings of blood chemistry: Secondary | ICD-10-CM | POA: Diagnosis not present

## 2015-03-10 LAB — HEPATIC FUNCTION PANEL
ALT: 33 U/L — ABNORMAL HIGH (ref 6–29)
AST: 28 U/L (ref 10–35)
Albumin: 4.2 g/dL (ref 3.6–5.1)
Alkaline Phosphatase: 95 U/L (ref 33–130)
Bilirubin, Direct: 0.1 mg/dL (ref <=0.2)
Indirect Bilirubin: 0.5 mg/dL (ref 0.2–1.2)
TOTAL PROTEIN: 6.5 g/dL (ref 6.1–8.1)
Total Bilirubin: 0.6 mg/dL (ref 0.2–1.2)

## 2015-03-19 NOTE — Progress Notes (Signed)
Quick Note:  No significant change in LFTs. Overall remaining normal. Check yearly. ______

## 2015-03-22 ENCOUNTER — Telehealth: Payer: Self-pay | Admitting: *Deleted

## 2015-03-22 ENCOUNTER — Other Ambulatory Visit: Payer: Self-pay

## 2015-03-22 DIAGNOSIS — R945 Abnormal results of liver function studies: Secondary | ICD-10-CM

## 2015-03-22 DIAGNOSIS — R7989 Other specified abnormal findings of blood chemistry: Secondary | ICD-10-CM

## 2015-03-22 NOTE — Telephone Encounter (Signed)
Pt came into office with Nose Bleed states has been going on since 330am, she stopped it and went back to bed, then nose started bleeding again at 500am stopped it and woke up at 730am still bleeding, pt thought her BP was up checked it in office reading was 120/76. I asked pt is she has bumped it on anything that could have caused it to bleed and stated did not, pt is no c/o dizziness, or headaches, but is stuffy since nose bleed. Nose was not bleeding when pt arrived but had a napkin for protection.   I advise pt of steps to follow if begins again and if these steps do not seem to help stop the nose bleed to contact our office and we can schedule her. Pt voices understanding and will follow the steps given.

## 2015-03-22 NOTE — Progress Notes (Signed)
Quick Note:  LMOM to call. Lab order on file for 02/2016. ______

## 2015-03-23 NOTE — Progress Notes (Signed)
Quick Note:  Letter mailed to pt with the info and next check in 02/2016. ______

## 2015-04-22 ENCOUNTER — Telehealth: Payer: Self-pay | Admitting: Family Medicine

## 2015-04-22 NOTE — Telephone Encounter (Signed)
Patient requesting refill on xanax  [phone] (H)

## 2015-04-22 NOTE — Telephone Encounter (Signed)
Had we not discontinued this???

## 2015-04-22 NOTE — Telephone Encounter (Signed)
I thought we discontinued this due to concern about memory loss and the medicine possibly making this worse.

## 2015-04-22 NOTE — Telephone Encounter (Signed)
I called patient.  Explained per records appears xanax was discontinued.  She was very pleasant.  Told her really was due for routine visit and made her an appt so provider could see her and then discuss medications.

## 2015-04-29 ENCOUNTER — Encounter: Payer: Self-pay | Admitting: Family Medicine

## 2015-04-29 ENCOUNTER — Ambulatory Visit (INDEPENDENT_AMBULATORY_CARE_PROVIDER_SITE_OTHER): Payer: Medicare Other | Admitting: Family Medicine

## 2015-04-29 VITALS — BP 168/88 | HR 80 | Temp 97.6°F | Resp 20 | Wt 161.0 lb

## 2015-04-29 DIAGNOSIS — I10 Essential (primary) hypertension: Secondary | ICD-10-CM

## 2015-04-29 LAB — CBC WITH DIFFERENTIAL/PLATELET
BASOS ABS: 0 10*3/uL (ref 0.0–0.1)
BASOS PCT: 0 % (ref 0–1)
EOS ABS: 0.7 10*3/uL (ref 0.0–0.7)
EOS PCT: 11 % — AB (ref 0–5)
HCT: 42.5 % (ref 36.0–46.0)
Hemoglobin: 14.3 g/dL (ref 12.0–15.0)
LYMPHS ABS: 1.6 10*3/uL (ref 0.7–4.0)
Lymphocytes Relative: 26 % (ref 12–46)
MCH: 28.3 pg (ref 26.0–34.0)
MCHC: 33.6 g/dL (ref 30.0–36.0)
MCV: 84 fL (ref 78.0–100.0)
MPV: 9.1 fL (ref 8.6–12.4)
Monocytes Absolute: 0.5 10*3/uL (ref 0.1–1.0)
Monocytes Relative: 8 % (ref 3–12)
Neutro Abs: 3.5 10*3/uL (ref 1.7–7.7)
Neutrophils Relative %: 55 % (ref 43–77)
PLATELETS: 407 10*3/uL — AB (ref 150–400)
RBC: 5.06 MIL/uL (ref 3.87–5.11)
RDW: 15.3 % (ref 11.5–15.5)
WBC: 6.3 10*3/uL (ref 4.0–10.5)

## 2015-04-29 LAB — LIPID PANEL
Cholesterol: 257 mg/dL — ABNORMAL HIGH (ref 125–200)
HDL: 48 mg/dL (ref >=46)
LDL CALC: 159 mg/dL — AB (ref <130)
TRIGLYCERIDES: 249 mg/dL — AB (ref <150)
Total CHOL/HDL Ratio: 5.4 Ratio — ABNORMAL HIGH (ref <=5.0)
VLDL: 50 mg/dL — AB (ref <30)

## 2015-04-29 LAB — COMPLETE METABOLIC PANEL WITH GFR
ALT: 25 U/L (ref 6–29)
AST: 19 U/L (ref 10–35)
Albumin: 4 g/dL (ref 3.6–5.1)
Alkaline Phosphatase: 97 U/L (ref 33–130)
BUN: 14 mg/dL (ref 7–25)
CHLORIDE: 101 mmol/L (ref 98–110)
CO2: 27 mmol/L (ref 20–31)
CREATININE: 1.13 mg/dL — AB (ref 0.60–0.88)
Calcium: 9.5 mg/dL (ref 8.6–10.4)
GFR, Est African American: 53 mL/min — ABNORMAL LOW (ref >=60)
GFR, Est Non African American: 46 mL/min — ABNORMAL LOW (ref >=60)
Glucose, Bld: 91 mg/dL (ref 70–99)
Potassium: 3.7 mmol/L (ref 3.5–5.3)
Sodium: 139 mmol/L (ref 135–146)
Total Bilirubin: 0.5 mg/dL (ref 0.2–1.2)
Total Protein: 6.4 g/dL (ref 6.1–8.1)

## 2015-04-29 NOTE — Progress Notes (Signed)
Subjective:    Patient ID: [name], female    DOB: 1932-05-12, 82 y.o.   MRN: [id number]  HPI 09/2013 Patient has a history of spinal stenosis due to degenerative disc disease and spondylosis in the lumbar spine.  Patient had an MRI of the lumbar spine in January 2014 results are dictated below: L1-2: Diffuse bulging annulus, osteophytic ridging and facet  disease but no significant spinal stenosis. There is mild  bilateral lateral recess and foraminal stenosis.  L2-3: Severe degenerative disc disease with a bulging degenerated  annulus, osteophytic spurring and facet disease. There is  bilateral lateral recess stenosis, right greater than left and mild  to moderate moderate bilateral foraminal stenosis.  L3-4: Diffuse bulging degenerated annulus, osteophytic ridging,  short pedicles and facet disease contributing to mild spinal  stenosis, moderate bilateral lateral recess stenosis and mild  bilateral foraminal stenosis, left greater than right.  L4-5: Diffuse bulging degenerated annulus, osteophytic ridging,  short pedicles and facet disease contributing to moderate spinal  and bilateral lateral recess stenosis and mild bilateral foraminal  stenosis, right greater than left.  L5-S1: Focal central disc protrusion, diffuse bulging annulus,  short pedicles and advanced facet disease with mass effect on the  ventral thecal sac and right-sided foraminal stenosis.  IMPRESSION:  Scoliosis and advanced degenerative lumbar spondylosis. There is  multilevel multifactorial spinal, lateral recess and foraminal  stenosis as discussed above at the individual levels.  Her back pain is worsening. She is now having neuropathic pain radiating into both legs. Her left leg worse than her right leg. She is now interested in getting a second opinion regarding treatment. She also is mainly concerned about swelling in both legs. She has +1 pitting edema in both legs to the level of her knees. She  denies any chest pain shortness of breath or dyspnea on exertion. She denies any orthopnea or paroxysmal nocturnal dyspnea. She denies any oliguria. She has no evidence of jaundice or ascites.  At that time, my plan was: 1. Bilateral leg edema Discontinue lotrel.  Scheduled patient for echocardiogram to rule out congestive heart failure or diastolic dysfunction. Check CMP to evaluate for kidney or liver insufficiency.  Replace Lotrel with Hyzaar 50/12.5 one by mouth daily. I anticipate that this will improve the edema and resolve the situation - losartan-hydrochlorothiazide (HYZAAR) 50-12.5 MG per tablet; Take 1 tablet by mouth daily.  Dispense: 30 tablet; Refill: 3 - COMPLETE METABOLIC PANEL WITH GFR - 2D Echocardiogram without contrast; Future  2. Spinal stenosis of lumbar region Consult orthopedic surgery. The patient may require repeat MRI. Hopefully she could benefit from epidural steroid injections. - Ambulatory referral to Orthopedic Surgery  01/10/15 Patient is here today to follow-up her blood pressure. She has been off the Hyzaar for more than a week. Her blood pressure today is elevated at 160/100. She also complains of persistent low back pain with aching deep pain in both legs from her thighs down to her feet consistent with neuropathic pain from spinal stenosis. She is currently seeing [facility] orthopedics and receiving Flexeril along with hydrocodone for this. Patient quit her statin medication for probably one year ago. We have not rechecked her cholesterol since.  At that time, my plan was: Resume Hyzaar 50/12.5, 1 tablet by mouth daily. Recheck blood pressure in 2 weeks. I would also like the patient to return fasting at that time for a CMP as well as a fasting lipid panel. I want to monitor her kidney function after we  resumed the Hyzaar given her chronic kidney disease. Also check a fasting lipid panel given the fact she's been off his statin medication now for over a year. Given  the pain she is having in her legs, I will start the patient on gabapentin 300 mg by mouth every 8 hours when necessary nerve pain to see if this will help some of the neuropathic pain coming from her spinal stenosis.  04/29/15 Her low pressure today is significantly elevated at 168/88. She denies any chest pain shortness of breath or dyspnea on exertion. She states that she is checking her blood pressure frequently at home and that her blood pressure typically at home is 120/80. She is using a wrist cuff to check her blood pressure which may not be accurate. She continues to have low back pain for which she occasionally takes hydrocodone. She was asking earlier about using Xanax to help her sleep however she's been noticing more problems with memory loss. I performed a Mini-Mental status exam today and she scored 29 out of 30. She missed 1 objects on recall of 3 objects. She knows date and location. She was able to spell world in reverse with no difficulty. However she had a tremendous amount of difficulty performing serial sevens. Therefore I'm concerned there may be some early memory loss and I recommend staying away from Xanax to avoid exacerbating that. Past Medical History  Diagnosis Date  . Hypercholesterolemia   . Hypertension   . Mitral valve prolapse   . Mild acid reflux   . Staph infection      history of a wound infection following a previous right hip surgery  . CKD (chronic kidney disease) stage 3, GFR 30-59 ml/min   . Spinal stenosis    Current Outpatient Prescriptions on File Prior to Visit  Medication Sig Dispense Refill  . albuterol (PROVENTIL HFA;VENTOLIN HFA) 108 (90 BASE) MCG/ACT inhaler Inhale 2 puffs into the lungs every 4 (four) hours as needed for wheezing or shortness of breath. 1 Inhaler 0  . albuterol (PROVENTIL) (2.5 MG/3ML) 0.083% nebulizer solution Take 3 mLs (2.5 mg total) by nebulization every 6 (six) hours as needed for wheezing or shortness of breath. 150 mL 0  .  amLODipine (NORVASC) 10 MG tablet Take 1 tablet (10 mg total) by mouth daily. 90 tablet 0  . Biotin 2500 MCG CAPS Take 2,500 mg by mouth daily.    . butalbital-aspirin-caffeine (FIORINAL) 50-325-40 MG per capsule TAKE ONE CAPSULE BY MOUTH EVERY 8 HOURS AS NEEDED FOR HEADACHE 30 capsule 0  . citalopram (CELEXA) 20 MG tablet TAKE 1 TABLET DAILY 90 tablet 3  . clotrimazole-betamethasone (LOTRISONE) cream Apply 1 application topically 2 (two) times daily. (Patient taking differently: Apply 1 application topically 2 (two) times daily as needed (for irritation). ) 30 g 0  . cyclobenzaprine (FLEXERIL) 5 MG tablet Take 2.5-5 mg by mouth 3 (three) times daily as needed for muscle spasms.    . fish oil-omega-3 fatty acids 1000 MG capsule Take 2 g by mouth daily.    Marland Kitchen gabapentin (NEURONTIN) 300 MG capsule Take 1 capsule (300 mg total) by mouth 3 (three) times daily. 90 capsule 3  . HYDROcodone-acetaminophen (NORCO/VICODIN) 5-325 MG per tablet Take 1 tablet by mouth every 4 (four) hours as needed for moderate pain. (Patient taking differently: Take 0.5-1 tablets by mouth every 4 (four) hours as needed for moderate pain. ) 30 tablet 0  . losartan-hydrochlorothiazide (HYZAAR) 50-12.5 MG tablet Take 1 tablet by mouth daily.  30 tablet 11  . montelukast (SINGULAIR) 10 MG tablet Take 1 tablet (10 mg total) by mouth at bedtime. 30 tablet 3  . Multiple Vitamin (MULTIVITAMIN) capsule Take 1 capsule by mouth daily.    . vitamin B-12 (CYANOCOBALAMIN) 500 MCG tablet Take 500 mcg by mouth daily.     No current facility-administered medications on file prior to visit.   Allergies  Allergen Reactions  . Demerol [Meperidine] Other (See Comments)    Altered mental status  . Meperidine Hcl Other (See Comments)    Altered mental status   Social History   Social History  . Marital Status: Widowed    Spouse Name: N/A  . Number of Children: N/A  . Years of Education: N/A   Occupational History  . Not on file.    Social History Main Topics  . Smoking status: Never Smoker   . Smokeless tobacco: Never Used  . Alcohol Use: No  . Drug Use: No  . Sexual Activity: No     Comment: widowed   Other Topics Concern  . Not on file   Social History Narrative      Review of Systems  All other systems reviewed and are negative.      Objective:   Physical Exam  Cardiovascular: Normal rate, regular rhythm and normal heart sounds.   No murmur heard. Pulmonary/Chest: Effort normal and breath sounds normal. No respiratory distress. She has no wheezes. She has no rales. She exhibits no tenderness.  Abdominal: Soft. Bowel sounds are normal. She exhibits no distension and no mass. There is no tenderness. There is no rebound and no guarding.  Musculoskeletal: She exhibits edema.  Vitals reviewed.         Assessment & Plan:   Benign essential HTN - Plan: CBC with Differential/Platelet, COMPLETE METABOLIC PANEL WITH GFR, Lipid panel  Blood pressure is definitely elevated today. I recommended the patient bring her cuff back by [place] and we can check her blood pressure against her blood pressure cuff. If inaccurate, I would increase Hyzaar to 2 pills a day to try to address her blood pressure more appropriately. I will also plan a CMP and a fasting lipid panel. There is some early evidence of memory loss. Therefore I will try to avoid any medication that may exacerbate that and recheck in 6 months

## 2015-05-02 ENCOUNTER — Ambulatory Visit: Payer: Medicare Other | Admitting: Family Medicine

## 2015-05-02 VITALS — BP 126/70

## 2015-05-02 DIAGNOSIS — I10 Essential (primary) hypertension: Secondary | ICD-10-CM

## 2015-05-02 NOTE — Patient Instructions (Signed)
Dr. Dennard Schaumann aware of pt's BP here in office today. Pt will return next week for an additional BP check. Per WTP continue current course of action and call if any problems.

## 2015-05-04 ENCOUNTER — Other Ambulatory Visit: Payer: Self-pay | Admitting: *Deleted

## 2015-05-04 ENCOUNTER — Other Ambulatory Visit: Payer: Self-pay | Admitting: Family Medicine

## 2015-05-04 DIAGNOSIS — E785 Hyperlipidemia, unspecified: Secondary | ICD-10-CM

## 2015-05-04 DIAGNOSIS — Z79899 Other long term (current) drug therapy: Secondary | ICD-10-CM

## 2015-05-04 MED ORDER — AMLODIPINE BESYLATE 10 MG PO TABS: 10.0000 mg | ORAL_TABLET | Freq: Every day | ORAL | Status: DC

## 2015-05-04 MED ORDER — ATORVASTATIN CALCIUM 20 MG PO TABS: 20.0000 mg | ORAL_TABLET | Freq: Every day | ORAL | Status: DC

## 2015-05-04 NOTE — Telephone Encounter (Signed)
Received fax requesting refill on Norvasc.   Refill appropriate and filled per protocol.  

## 2015-06-20 DIAGNOSIS — G894 Chronic pain syndrome: Secondary | ICD-10-CM | POA: Diagnosis not present

## 2015-06-20 DIAGNOSIS — M5136 Other intervertebral disc degeneration, lumbar region: Secondary | ICD-10-CM | POA: Diagnosis not present

## 2015-06-20 DIAGNOSIS — M4806 Spinal stenosis, lumbar region: Secondary | ICD-10-CM | POA: Diagnosis not present

## 2015-06-20 DIAGNOSIS — Z79891 Long term (current) use of opiate analgesic: Secondary | ICD-10-CM | POA: Diagnosis not present

## 2015-09-03 ENCOUNTER — Ambulatory Visit (HOSPITAL_COMMUNITY)
Admission: EM | Admit: 2015-09-03 | Discharge: 2015-09-03 | Disposition: A | Discharge status: Nursing Home (Includes ICF) | Payer: BC Managed Care – PPO | Attending: Physician Assistant | Admitting: Physician Assistant

## 2015-09-03 ENCOUNTER — Emergency Department (HOSPITAL_COMMUNITY): Payer: Medicare Other

## 2015-09-03 ENCOUNTER — Other Ambulatory Visit: Payer: Self-pay

## 2015-09-03 ENCOUNTER — Inpatient Hospital Stay (HOSPITAL_COMMUNITY)
Admission: EM | Admit: 2015-09-03 | Discharge: 2015-09-16 | DRG: 202 | Disposition: A | Discharge status: Home Health | Payer: Medicare Other | Attending: Internal Medicine | Admitting: Internal Medicine

## 2015-09-03 ENCOUNTER — Encounter (HOSPITAL_COMMUNITY): Payer: Self-pay | Admitting: Emergency Medicine

## 2015-09-03 DIAGNOSIS — K210 Gastro-esophageal reflux disease with esophagitis: Secondary | ICD-10-CM | POA: Diagnosis present

## 2015-09-03 DIAGNOSIS — J690 Pneumonitis due to inhalation of food and vomit: Secondary | ICD-10-CM | POA: Diagnosis not present

## 2015-09-03 DIAGNOSIS — J189 Pneumonia, unspecified organism: Secondary | ICD-10-CM

## 2015-09-03 DIAGNOSIS — E785 Hyperlipidemia, unspecified: Secondary | ICD-10-CM | POA: Diagnosis not present

## 2015-09-03 DIAGNOSIS — I341 Nonrheumatic mitral (valve) prolapse: Secondary | ICD-10-CM | POA: Diagnosis present

## 2015-09-03 DIAGNOSIS — K567 Ileus, unspecified: Secondary | ICD-10-CM | POA: Diagnosis not present

## 2015-09-03 DIAGNOSIS — J4551 Severe persistent asthma with (acute) exacerbation: Secondary | ICD-10-CM

## 2015-09-03 DIAGNOSIS — Z885 Allergy status to narcotic agent status: Secondary | ICD-10-CM

## 2015-09-03 DIAGNOSIS — T380X5A Adverse effect of glucocorticoids and synthetic analogues, initial encounter: Secondary | ICD-10-CM | POA: Diagnosis present

## 2015-09-03 DIAGNOSIS — R5383 Other fatigue: Secondary | ICD-10-CM | POA: Diagnosis not present

## 2015-09-03 DIAGNOSIS — K219 Gastro-esophageal reflux disease without esophagitis: Secondary | ICD-10-CM | POA: Diagnosis present

## 2015-09-03 DIAGNOSIS — M4800 Spinal stenosis, site unspecified: Secondary | ICD-10-CM | POA: Diagnosis present

## 2015-09-03 DIAGNOSIS — Z96643 Presence of artificial hip joint, bilateral: Secondary | ICD-10-CM | POA: Diagnosis not present

## 2015-09-03 DIAGNOSIS — K921 Melena: Secondary | ICD-10-CM | POA: Diagnosis not present

## 2015-09-03 DIAGNOSIS — J9601 Acute respiratory failure with hypoxia: Secondary | ICD-10-CM | POA: Diagnosis not present

## 2015-09-03 DIAGNOSIS — J45901 Unspecified asthma with (acute) exacerbation: Secondary | ICD-10-CM | POA: Diagnosis not present

## 2015-09-03 DIAGNOSIS — I10 Essential (primary) hypertension: Secondary | ICD-10-CM | POA: Diagnosis not present

## 2015-09-03 DIAGNOSIS — A419 Sepsis, unspecified organism: Secondary | ICD-10-CM | POA: Diagnosis not present

## 2015-09-03 DIAGNOSIS — J441 Chronic obstructive pulmonary disease with (acute) exacerbation: Secondary | ICD-10-CM | POA: Diagnosis not present

## 2015-09-03 DIAGNOSIS — E876 Hypokalemia: Secondary | ICD-10-CM | POA: Diagnosis not present

## 2015-09-03 DIAGNOSIS — J9811 Atelectasis: Secondary | ICD-10-CM | POA: Diagnosis not present

## 2015-09-03 DIAGNOSIS — R6510 Systemic inflammatory response syndrome (SIRS) of non-infectious origin without acute organ dysfunction: Secondary | ICD-10-CM | POA: Diagnosis not present

## 2015-09-03 DIAGNOSIS — F329 Major depressive disorder, single episode, unspecified: Secondary | ICD-10-CM | POA: Diagnosis present

## 2015-09-03 DIAGNOSIS — R069 Unspecified abnormalities of breathing: Secondary | ICD-10-CM | POA: Diagnosis not present

## 2015-09-03 DIAGNOSIS — G9341 Metabolic encephalopathy: Secondary | ICD-10-CM | POA: Diagnosis not present

## 2015-09-03 DIAGNOSIS — R0602 Shortness of breath: Secondary | ICD-10-CM | POA: Diagnosis not present

## 2015-09-03 DIAGNOSIS — N183 Chronic kidney disease, stage 3 unspecified: Secondary | ICD-10-CM | POA: Diagnosis present

## 2015-09-03 DIAGNOSIS — D72829 Elevated white blood cell count, unspecified: Secondary | ICD-10-CM | POA: Diagnosis not present

## 2015-09-03 DIAGNOSIS — N1832 Chronic kidney disease, stage 3b: Secondary | ICD-10-CM | POA: Diagnosis present

## 2015-09-03 DIAGNOSIS — M48061 Spinal stenosis, lumbar region without neurogenic claudication: Secondary | ICD-10-CM

## 2015-09-03 DIAGNOSIS — R739 Hyperglycemia, unspecified: Secondary | ICD-10-CM | POA: Diagnosis present

## 2015-09-03 DIAGNOSIS — K264 Chronic or unspecified duodenal ulcer with hemorrhage: Secondary | ICD-10-CM | POA: Diagnosis not present

## 2015-09-03 DIAGNOSIS — I129 Hypertensive chronic kidney disease with stage 1 through stage 4 chronic kidney disease, or unspecified chronic kidney disease: Secondary | ICD-10-CM | POA: Diagnosis present

## 2015-09-03 DIAGNOSIS — E7800 Pure hypercholesterolemia, unspecified: Secondary | ICD-10-CM | POA: Diagnosis not present

## 2015-09-03 DIAGNOSIS — R918 Other nonspecific abnormal finding of lung field: Secondary | ICD-10-CM | POA: Diagnosis not present

## 2015-09-03 DIAGNOSIS — R0902 Hypoxemia: Secondary | ICD-10-CM

## 2015-09-03 DIAGNOSIS — D649 Anemia, unspecified: Secondary | ICD-10-CM

## 2015-09-03 DIAGNOSIS — D62 Acute posthemorrhagic anemia: Secondary | ICD-10-CM | POA: Diagnosis not present

## 2015-09-03 DIAGNOSIS — N3289 Other specified disorders of bladder: Secondary | ICD-10-CM | POA: Diagnosis not present

## 2015-09-03 HISTORY — DX: Disorder of kidney and ureter, unspecified: N28.9

## 2015-09-03 LAB — CBC WITH DIFFERENTIAL/PLATELET
BASOS PCT: 0 %
Basophils Absolute: 0 10*3/uL (ref 0.0–0.1)
EOS ABS: 0.9 10*3/uL — AB (ref 0.0–0.7)
Eosinophils Relative: 6 %
HEMATOCRIT: 43.7 % (ref 36.0–46.0)
HEMOGLOBIN: 14.6 g/dL (ref 12.0–15.0)
Lymphocytes Relative: 12 %
Lymphs Abs: 1.7 10*3/uL (ref 0.7–4.0)
MCH: 27.7 pg (ref 26.0–34.0)
MCHC: 33.4 g/dL (ref 30.0–36.0)
MCV: 82.8 fL (ref 78.0–100.0)
Monocytes Absolute: 0.9 10*3/uL (ref 0.1–1.0)
Monocytes Relative: 6 %
NEUTROS ABS: 10.4 10*3/uL — AB (ref 1.7–7.7)
NEUTROS PCT: 76 %
Platelets: 351 10*3/uL (ref 150–400)
RBC: 5.28 MIL/uL — AB (ref 3.87–5.11)
RDW: 13.9 % (ref 11.5–15.5)
WBC: 13.8 10*3/uL — AB (ref 4.0–10.5)

## 2015-09-03 LAB — COMPREHENSIVE METABOLIC PANEL
ALBUMIN: 4.2 g/dL (ref 3.5–5.0)
ALK PHOS: 130 U/L — AB (ref 38–126)
ALT: 41 U/L (ref 14–54)
AST: 29 U/L (ref 15–41)
Anion gap: 11 (ref 5–15)
BILIRUBIN TOTAL: 0.6 mg/dL (ref 0.3–1.2)
BUN: 13 mg/dL (ref 6–20)
CALCIUM: 9.5 mg/dL (ref 8.9–10.3)
CO2: 25 mmol/L (ref 22–32)
CREATININE: 1.09 mg/dL — AB (ref 0.44–1.00)
Chloride: 98 mmol/L — ABNORMAL LOW (ref 101–111)
GFR calc Af Amer: 54 mL/min — ABNORMAL LOW (ref >60)
GFR, EST NON AFRICAN AMERICAN: 46 mL/min — AB (ref >60)
GLUCOSE: 200 mg/dL — AB (ref 65–99)
Potassium: 3 mmol/L — ABNORMAL LOW (ref 3.5–5.1)
Sodium: 134 mmol/L — ABNORMAL LOW (ref 135–145)
TOTAL PROTEIN: 7.4 g/dL (ref 6.5–8.1)

## 2015-09-03 LAB — I-STAT ARTERIAL BLOOD GAS, ED
BICARBONATE: 25 meq/L — AB (ref 20.0–24.0)
O2 Saturation: 96 %
PH ART: 7.38 (ref 7.350–7.450)
Patient temperature: 98.6
TCO2: 26 mmol/L (ref 0–100)
pCO2 arterial: 42.3 mmHg (ref 35.0–45.0)
pO2, Arterial: 81 mmHg (ref 80.0–100.0)

## 2015-09-03 LAB — I-STAT TROPONIN, ED: TROPONIN I, POC: 0 ng/mL (ref 0.00–0.08)

## 2015-09-03 MED ORDER — METHYLPREDNISOLONE SODIUM SUCC 125 MG IJ SOLR
125.0000 mg | Freq: Once | INTRAMUSCULAR | Status: AC
  Administered 2015-09-03: 125 mg via INTRAVENOUS

## 2015-09-03 MED ORDER — METHYLPREDNISOLONE SODIUM SUCC 125 MG IJ SOLR
INTRAMUSCULAR | Status: AC
  Filled 2015-09-03: qty 2

## 2015-09-03 MED ORDER — ALBUTEROL (5 MG/ML) CONTINUOUS INHALATION SOLN
INHALATION_SOLUTION | RESPIRATORY_TRACT | Status: AC
  Filled 2015-09-03: qty 20

## 2015-09-03 MED ORDER — IPRATROPIUM-ALBUTEROL 0.5-2.5 (3) MG/3ML IN SOLN
3.0000 mL | Freq: Once | RESPIRATORY_TRACT | Status: AC
  Administered 2015-09-03: 3 mL via RESPIRATORY_TRACT

## 2015-09-03 MED ORDER — MAGNESIUM SULFATE 2 GM/50ML IV SOLN
2.0000 g | Freq: Once | INTRAVENOUS | Status: AC
  Administered 2015-09-03: 2 g via INTRAVENOUS
  Filled 2015-09-03: qty 50

## 2015-09-03 MED ORDER — IPRATROPIUM BROMIDE 0.02 % IN SOLN
RESPIRATORY_TRACT | Status: AC
  Filled 2015-09-03: qty 2.5

## 2015-09-03 MED ORDER — ALBUTEROL SULFATE (2.5 MG/3ML) 0.083% IN NEBU
INHALATION_SOLUTION | RESPIRATORY_TRACT | Status: AC
  Filled 2015-09-03: qty 6

## 2015-09-03 MED ORDER — AMLODIPINE BESYLATE 5 MG PO TABS
10.0000 mg | ORAL_TABLET | Freq: Once | ORAL | Status: AC
  Administered 2015-09-03: 10 mg via ORAL
  Filled 2015-09-03: qty 2

## 2015-09-03 MED ORDER — IOPAMIDOL (ISOVUE-370) INJECTION 76%
INTRAVENOUS | Status: AC
  Administered 2015-09-03: 75 mL
  Filled 2015-09-03: qty 100

## 2015-09-03 MED ORDER — ALBUTEROL SULFATE (2.5 MG/3ML) 0.083% IN NEBU
5.0000 mg | INHALATION_SOLUTION | Freq: Once | RESPIRATORY_TRACT | Status: AC
  Administered 2015-09-03: 5 mg via RESPIRATORY_TRACT

## 2015-09-03 MED ORDER — ALBUTEROL SULFATE (2.5 MG/3ML) 0.083% IN NEBU
2.5000 mg | INHALATION_SOLUTION | Freq: Once | RESPIRATORY_TRACT | Status: AC
  Administered 2015-09-03: 2.5 mg via RESPIRATORY_TRACT

## 2015-09-03 MED ORDER — ALBUTEROL (5 MG/ML) CONTINUOUS INHALATION SOLN
10.0000 mg/h | INHALATION_SOLUTION | RESPIRATORY_TRACT | Status: DC
  Administered 2015-09-03: 10 mg/h via RESPIRATORY_TRACT

## 2015-09-03 MED ORDER — SODIUM CHLORIDE 0.9 % IV SOLN
Freq: Once | INTRAVENOUS | Status: AC
  Administered 2015-09-03: 18:00:00 via INTRAVENOUS

## 2015-09-03 NOTE — ED Notes (Signed)
It was reported clinical staff called ems and koula, rn-charge.

## 2015-09-03 NOTE — ED Provider Notes (Signed)
CSN: [id number]     Arrival date & time 09/03/15  1757 History   First MD Initiated Contact with Patient 09/03/15 1758     Chief Complaint  Patient presents with  . Shortness of Breath     (Consider location/radiation/quality/duration/timing/severity/associated sxs/prior Treatment) The history is provided by the patient.  [name] is a 82 y.o. female hx of CKD, HL, HTN, COPD here with shortness of breath. Shortness of breath for the last 3 days. Patient Has some nonproductive cough as well. Denies any fevers. She felt very short of breath and received 1 nebulizer treatment at home. Didn't eat some lunch and then felt worse shortness of breath. She went to urgent care and oxygen level was around 80% on RA and given 15 mg albuterol, 1 mg atrovent, and 125 mg solumedrol prior to arrival. Has no hx of PE or DVT. No recent travel.       Past Medical History  Diagnosis Date  . Hypercholesterolemia   . Hypertension   . Mitral valve prolapse   . Mild acid reflux   . Staph infection      history of a wound infection following a previous right hip surgery  . CKD (chronic kidney disease) stage 3, GFR 30-59 ml/min   . Spinal stenosis   . Renal insufficiency    Past Surgical History  Procedure Laterality Date  . Right breast lumpectomy    . Left total hip replacement arthroplasty  June of 2001  . Right total hip replacement arthroplasty  August of 2001  . Abcess drainage    . Colonoscopy  04/30/01    [id number] hemorrhoids, otherwise, normal rectum/colon/Normal terminal ileum  . Esophagogastroduodenoscopy    03/01/2007    [id number] esophagus without evidence of Barrett's, mass/Mild erythema in the antrum.  Distorted pylorus consistent with prior ulcer disease.  Biopsies obtained/ 3. Junction of D1 and D2 erythematous, edematous, and strictured.  The lumen was narrowed to approximately 10 mm.   . Colonoscopy  03/02/2007    [id number] sigmoid colon diverticula.  Otherwise no polyps,  masses/Normal terminal ileum.  Approximately 10-15 cm of the distal terminal ileum visualized/normal view of the rectum  . Total knee arthroplasty    . Abdominal hysterectomy     Family History  Problem Relation Age of Onset  . Colon cancer Neg Hx    Social History  Substance Use Topics  . Smoking status: Never Smoker   . Smokeless tobacco: Never Used  . Alcohol Use: No   OB History    No data available     Review of Systems  Respiratory: Positive for shortness of breath.   All other systems reviewed and are negative.     Allergies  Demerol and Meperidine hcl  Home Medications   Prior to Admission medications   Medication Sig Start Date End Date Taking? Authorizing Provider  albuterol (PROVENTIL HFA;VENTOLIN HFA) 108 (90 BASE) MCG/ACT inhaler Inhale 2 puffs into the lungs every 4 (four) hours as needed for wheezing or shortness of breath. 05/25/14  Yes Susy Frizzle, MD  albuterol (PROVENTIL) (2.5 MG/3ML) 0.083% nebulizer solution Take 3 mLs (2.5 mg total) by nebulization every 6 (six) hours as needed for wheezing or shortness of breath. 06/14/14  Yes Susy Frizzle, MD  amLODipine (NORVASC) 10 MG tablet Take 1 tablet (10 mg total) by mouth daily. 05/04/15  Yes Susy Frizzle, MD  atorvastatin (LIPITOR) 20 MG tablet Take 1 tablet (20 mg total) by mouth daily. 05/04/15  Yes Susy Frizzle, MD  Biotin 2500 MCG CAPS Take 2,500 mg by mouth daily.   Yes Historical Provider, MD  butalbital-aspirin-caffeine [facility]) 50-325-40 MG per capsule TAKE ONE CAPSULE BY MOUTH EVERY 8 HOURS AS NEEDED FOR HEADACHE 04/15/14  Yes Susy Frizzle, MD  cetirizine (ZYRTEC) 10 MG tablet Take 10 mg by mouth daily.   Yes Historical Provider, MD  citalopram (CELEXA) 20 MG tablet TAKE 1 TABLET DAILY 11/04/14  Yes Susy Frizzle, MD  cyclobenzaprine (FLEXERIL) 5 MG tablet Take 2.5-5 mg by mouth 3 (three) times daily as needed for muscle spasms.   Yes Historical Provider, MD  fish oil-omega-3 fatty  acids 1000 MG capsule Take 2 g by mouth daily.   Yes Historical Provider, MD  gabapentin (NEURONTIN) 300 MG capsule Take 1 capsule (300 mg total) by mouth 3 (three) times daily. 01/10/15  Yes Susy Frizzle, MD  HYDROcodone-acetaminophen (NORCO/VICODIN) 5-325 MG per tablet Take 1 tablet by mouth every 4 (four) hours as needed for moderate pain. Patient taking differently: Take 0.5-1 tablets by mouth every 4 (four) hours as needed for moderate pain.  12/18/13  Yes Susy Frizzle, MD  losartan-hydrochlorothiazide (HYZAAR) 50-12.5 MG tablet Take 1 tablet by mouth daily. 01/10/15  Yes Susy Frizzle, MD  montelukast (SINGULAIR) 10 MG tablet Take 1 tablet (10 mg total) by mouth at bedtime. 11/01/14  Yes Susy Frizzle, MD  Multiple Vitamin (MULTIVITAMIN) capsule Take 1 capsule by mouth daily.   Yes Historical Provider, MD  vitamin B-12 (CYANOCOBALAMIN) 500 MCG tablet Take 500 mcg by mouth daily.   Yes Historical Provider, MD  clotrimazole-betamethasone (LOTRISONE) cream Apply 1 application topically 2 (two) times daily. Patient taking differently: Apply 1 application topically 2 (two) times daily as needed (for irritation).  07/02/13   Susy Frizzle, MD   BP 160/68 mmHg  Pulse 99  Temp(Src) 99.2 F (37.3 C) (Oral)  Resp 21  Ht 5' 3.5" (1.613 m)  Wt 160 lb (72.576 kg)  BMI 27.89 kg/m2  SpO2 95% Physical Exam  Constitutional: She is oriented to person, place, and time.  tachypneic   HENT:  Head: Normocephalic.  Eyes: Conjunctivae are normal. Pupils are equal, round, and reactive to light.  Neck: Normal range of motion. Neck supple.  Cardiovascular: Regular rhythm and normal heart sounds.   Slightly tachy   Pulmonary/Chest:  Tachypneic, mild retractions. Moderate distress with diffuse wheezing. Not tripoding   Abdominal: Soft. Bowel sounds are normal. She exhibits no distension. There is no tenderness. There is no rebound.  Musculoskeletal: Normal range of motion. She exhibits no  edema or tenderness.  Neurological: She is alert and oriented to person, place, and time.  Skin: Skin is warm and dry.  Psychiatric: She has a normal mood and affect. Her behavior is normal. Judgment and thought content normal.  Nursing note and vitals reviewed.   ED Course  Procedures (including critical care time) Labs Review Labs Reviewed  CBC WITH DIFFERENTIAL/PLATELET - Abnormal; Notable for the following:    WBC 13.8 (*)    RBC 5.28 (*)    Neutro Abs 10.4 (*)    Eosinophils Absolute 0.9 (*)    All other components within normal limits  COMPREHENSIVE METABOLIC PANEL - Abnormal; Notable for the following:    Sodium 134 (*)    Potassium 3.0 (*)    Chloride 98 (*)    Glucose, Bld 200 (*)    Creatinine, Ser 1.09 (*)    Alkaline Phosphatase  130 (*)    GFR calc non Af Amer 46 (*)    GFR calc Af Amer 54 (*)    All other components within normal limits  I-STAT TROPOININ, ED    Imaging Review Ct Angio Chest Pe W Or Wo Contrast  09/03/2015  CLINICAL DATA:  Shortness of breath.  Stage 3 kidney disease. EXAM: CT ANGIOGRAPHY CHEST WITH CONTRAST TECHNIQUE: Multidetector CT imaging of the chest was performed using the standard protocol during bolus administration of intravenous contrast. Multiplanar CT image reconstructions and MIPs were obtained to evaluate the vascular anatomy. CONTRAST:  75 mL Isovue 370 IV COMPARISON:  CT abdomen 06/10/2007 FINDINGS: Lungs are adequately inflated with minimal dependent atelectasis over the right base. Triangular intrapulmonary lymph node over the minor fissure. No lobar consolidation or effusion. Mild AP narrowing of the trachea and central bronchi. Heart is normal size. Calcified atherosclerotic plaque over the left anterior descending coronary artery. Mild calcified plaque over the thoracic aorta. No evidence of pulmonary embolism. No significant hilar or mediastinal adenopathy. Remaining mediastinal structures are within normal. Images through the upper  abdomen demonstrate no focal abnormality. There are degenerative changes of the spine. Stable mild L2 compression deformity. Review of the MIP images confirms the above findings. IMPRESSION: No evidence of pulmonary embolism. No acute cardiopulmonary disease. Minimal atherosclerotic coronary artery disease. Electronically Signed   By: Marin Olp M.D.   On: 09/03/2015 21:18   Dg Chest Port 1 View  09/03/2015  CLINICAL DATA:  Patient with wheezing and shortness of breath. EXAM: PORTABLE CHEST 1 VIEW COMPARISON:  Chest radiograph 05/22/2014. FINDINGS: Multiple monitoring leads overlie the patient. Stable cardiac and mediastinal contours with tortuosity of the thoracic aorta. Interval development of bilateral mid and lower lung heterogeneous opacities. No pleural effusion or pneumothorax. IMPRESSION: Bilateral mid and lower lung heterogeneous opacities favored to represent atelectasis. Infection not excluded. Electronically Signed   By: Lovey Newcomer M.D.   On: 09/03/2015 18:23   I have personally reviewed and evaluated these images and lab results as part of my medical decision-making.   EKG Interpretation None      MDM   Final diagnoses:  None   [name] is a 82 y.o. female here with shortness of breath, hypoxia. Likely COPD exacerbation. Given degree of hypoxia, will get CT angio. Already given solumedrol, 15 mg albuterol and 1 mg atrovent. Will add magnesium. Will get labs, CXR. Will admit.   9:23 PM Still desat to 85% on RA and still wheezing despite nebs, magnesium, solumedrol. CT angio showed no PE. Will admit for COPD exacerbation.      Wandra Arthurs, MD 09/03/15 2123

## 2015-09-03 NOTE — ED Provider Notes (Signed)
CSN: [id number]     Arrival date & time 09/03/15  1711 History   None    No chief complaint on file.  (Consider location/radiation/quality/duration/timing/severity/associated sxs/prior Treatment) Patient is a 82 y.o. female presenting with shortness of breath. The history is provided by the patient. No language interpreter was used.  Shortness of Breath Severity:  Moderate Onset quality:  Gradual Duration:  3 days Timing:  Constant Progression:  Worsening Chronicity:  Recurrent Context: not URI   Relieved by:  Nothing Worsened by:  Coughing Ineffective treatments:  None tried Associated symptoms: cough   Risk factors: no hx of PE/DVT   Per patients daughter.  Pt has a history of asthma.  Pt began having difficulty breathing about 30 minutes before coming in.  Daughter reports pt has never had an attack this severe. Pt began having trouble talking while on the way here.  Rn reports minimal air movement and low 02 sat.     Past Medical History  Diagnosis Date  . Hypercholesterolemia   . Hypertension   . Mitral valve prolapse   . Mild acid reflux   . Staph infection      history of a wound infection following a previous right hip surgery  . CKD (chronic kidney disease) stage 3, GFR 30-59 ml/min   . Spinal stenosis    Past Surgical History  Procedure Laterality Date  . Right breast lumpectomy    . Left total hip replacement arthroplasty  June of 2001  . Right total hip replacement arthroplasty  August of 2001  . Abcess drainage    . Colonoscopy  04/30/01    [id number] hemorrhoids, otherwise, normal rectum/colon/Normal terminal ileum  . Esophagogastroduodenoscopy    03/01/2007    [id number] esophagus without evidence of Barrett's, mass/Mild erythema in the antrum.  Distorted pylorus consistent with prior ulcer disease.  Biopsies obtained/ 3. Junction of D1 and D2 erythematous, edematous, and strictured.  The lumen was narrowed to approximately 10 mm.   . Colonoscopy   03/02/2007    [id number] sigmoid colon diverticula.  Otherwise no polyps, masses/Normal terminal ileum.  Approximately 10-15 cm of the distal terminal ileum visualized/normal view of the rectum  . Total knee arthroplasty     Family History  Problem Relation Age of Onset  . Colon cancer Neg Hx    Social History  Substance Use Topics  . Smoking status: Never Smoker   . Smokeless tobacco: Never Used  . Alcohol Use: No   OB History    No data available     Review of Systems  Unable to perform ROS: Acuity of condition  Respiratory: Positive for cough and shortness of breath.     Allergies  Demerol and Meperidine hcl  Home Medications   Prior to Admission medications   Medication Sig Start Date End Date Taking? Authorizing Provider  albuterol (PROVENTIL HFA;VENTOLIN HFA) 108 (90 BASE) MCG/ACT inhaler Inhale 2 puffs into the lungs every 4 (four) hours as needed for wheezing or shortness of breath. 05/25/14   Susy Frizzle, MD  albuterol (PROVENTIL) (2.5 MG/3ML) 0.083% nebulizer solution Take 3 mLs (2.5 mg total) by nebulization every 6 (six) hours as needed for wheezing or shortness of breath. 06/14/14   Susy Frizzle, MD  amLODipine (NORVASC) 10 MG tablet Take 1 tablet (10 mg total) by mouth daily. 05/04/15   Susy Frizzle, MD  atorvastatin (LIPITOR) 20 MG tablet Take 1 tablet (20 mg total) by mouth daily. 05/04/15   Susy Frizzle,  MD  Biotin 2500 MCG CAPS Take 2,500 mg by mouth daily.    Historical Provider, MD  butalbital-aspirin-caffeine [facility]) 50-325-40 MG per capsule TAKE ONE CAPSULE BY MOUTH EVERY 8 HOURS AS NEEDED FOR HEADACHE 04/15/14   Susy Frizzle, MD  citalopram (CELEXA) 20 MG tablet TAKE 1 TABLET DAILY 11/04/14   Susy Frizzle, MD  clotrimazole-betamethasone (LOTRISONE) cream Apply 1 application topically 2 (two) times daily. Patient taking differently: Apply 1 application topically 2 (two) times daily as needed (for irritation).  07/02/13   Susy Frizzle,  MD  cyclobenzaprine (FLEXERIL) 5 MG tablet Take 2.5-5 mg by mouth 3 (three) times daily as needed for muscle spasms.    Historical Provider, MD  fish oil-omega-3 fatty acids 1000 MG capsule Take 2 g by mouth daily.    Historical Provider, MD  gabapentin (NEURONTIN) 300 MG capsule Take 1 capsule (300 mg total) by mouth 3 (three) times daily. 01/10/15   Susy Frizzle, MD  HYDROcodone-acetaminophen (NORCO/VICODIN) 5-325 MG per tablet Take 1 tablet by mouth every 4 (four) hours as needed for moderate pain. Patient taking differently: Take 0.5-1 tablets by mouth every 4 (four) hours as needed for moderate pain.  12/18/13   Susy Frizzle, MD  losartan-hydrochlorothiazide (HYZAAR) 50-12.5 MG tablet Take 1 tablet by mouth daily. 01/10/15   Susy Frizzle, MD  montelukast (SINGULAIR) 10 MG tablet Take 1 tablet (10 mg total) by mouth at bedtime. 11/01/14   Susy Frizzle, MD  Multiple Vitamin (MULTIVITAMIN) capsule Take 1 capsule by mouth daily.    Historical Provider, MD  vitamin B-12 (CYANOCOBALAMIN) 500 MCG tablet Take 500 mcg by mouth daily.    Historical Provider, MD   Meds Ordered and Administered this Visit   Medications  0.9 %  sodium chloride infusion ( Intravenous New Bag/Given 09/03/15 1742)  methylPREDNISolone sodium succinate (SOLU-MEDROL) 125 mg/2 mL injection 125 mg (125 mg Intravenous Given 09/03/15 1743)  ipratropium-albuterol (DUONEB) 0.5-2.5 (3) MG/3ML nebulizer solution 3 mL (3 mLs Nebulization Given 09/03/15 1743)  albuterol (PROVENTIL) (2.5 MG/3ML) 0.083% nebulizer solution 2.5 mg (2.5 mg Nebulization Given 09/03/15 1743)  albuterol (PROVENTIL) (2.5 MG/3ML) 0.083% nebulizer solution 5 mg (5 mg Nebulization Given 09/03/15 1745)    BP 205/113 mmHg  Pulse 101  Temp(Src) 97.9 F (36.6 C)  Resp 36  SpO2 100% No data found.   Physical Exam  Constitutional: She is oriented to person, place, and time. She appears well-developed and well-nourished.  HENT:  Head: Normocephalic.   Right Ear: External ear normal.  Left Ear: External ear normal.  Nose: Nose normal.  Eyes: Conjunctivae and EOM are normal. Pupils are equal, round, and reactive to light.  Neck: Normal range of motion.  Cardiovascular:  Tachycardia 110  Pulmonary/Chest: She is in respiratory distress. She has wheezes.  Abdominal: She exhibits no distension.  Musculoskeletal: Normal range of motion.  Neurological: She is alert and oriented to person, place, and time.  Skin:  sweaty  Psychiatric: She has a normal mood and affect.  Nursing note and vitals reviewed.   ED Course  Procedures (including critical care time)  Labs Review Labs Reviewed - No data to display  Imaging Review No results found.   Visual Acuity Review  Right Eye Distance:   Left Eye Distance:   Bilateral Distance:    Right Eye Near:   Left Eye Near:    Bilateral Near:         MDM  Pt started on atrovent and  albuterol neb.  Pt placed on a monitor, pulse ox low 80's IV started.  Pt given solumedrol IV. 125.   Dr. Juventino Slovak at bedside.  Pt started on second 5mg   Albuterol neb.  [facility] called to transport. Pt is able to talk a little and 02 sats have improved.  EMS arrived and pt transported to ED   1. Asthma, severe persistent, with acute exacerbation       Fransico Meadow, PA-C 09/03/15 1825

## 2015-09-03 NOTE — ED Notes (Signed)
Gave pt [place] sandwich, graham crackers and ice water, per Melissa I. - RN.

## 2015-09-03 NOTE — ED Notes (Signed)
Pt here with SOB x 3 days. Pt went to [place] reports sats in 80s on room ait. Pt received 15mg  albuterol, 1 mg atrovent, and 125 solumedrol PTA.

## 2015-09-03 NOTE — H&P (Signed)
[name] [id number] DOB: 11-23-1932 DOA: 09/03/2015     PCP: Odette Fraction, MD   Outpatient Specialists: none Patient coming from:   home Lives  With family  Chief Complaint: Dyspnea  HPI: [name] is a 82 y.o. female with medical history significant of CKD , HTN spinal stenosis,  GERD, HL, Asthma  Presented with dyspnea wheezing over the past 3 days has been getting worse gradually in onset, reports history of  asthma although this has not been charted in the past Includes that she can barely moving air. Presented to urgent care was found to be hypoxic down to 80% and sent to emergency department received continuous nebulizer albuterol 1 mg of Atrovent and 125 of Solu-Medrol on arrival Reports some non-productive cough initially was fairly short of breath now after nebulizer treatment somewhat improved though not able to finish sentences.      IN ER: Temperature 99.2, heart rate 110, respirations 36 now down to 21, blood pressure initially 231/105 now down to 160/68 satting 95% on 2 L WBC 13.8 hemoglobin 14.6, sodium 134 potassium 3.0 creatinine 1.09 glucose 200 Cc her chest show no evidence of pulmonary embolism no acute cardiopulmonary disease   Hospitalist was called for admission for asthma/COPD exacerbation  Review of Systems:    Pertinent positives include:  shortness of breath at rest, dyspnea on exertion, No non-productive cough, wheezing.  Constitutional:  No weight loss, night sweats, Fevers, chills, fatigue, weight loss  HEENT:  No headaches, Difficulty swallowing,Tooth/dental problems,Sore throat,  No sneezing, itching, ear ache, nasal congestion, post nasal drip,  Cardio-vascular:  No chest pain, Orthopnea, PND, anasarca, dizziness, palpitations.no Bilateral lower extremity swelling  GI:  No heartburn, indigestion, abdominal pain, nausea, vomiting, diarrhea, change in bowel habits, loss of appetite, melena, blood in stool,  hematemesis Resp:   No excess mucus, no productive cough,  No coughing up of blood.No change in color of mucus.  Skin:  no rash or lesions. No jaundice GU:  no dysuria, change in color of urine, no urgency or frequency. No straining to urinate.  No flank pain.  Musculoskeletal:  No joint pain or no joint swelling. No decreased range of motion. No back pain.  Psych:  No change in mood or affect. No depression or anxiety. No memory loss.  Neuro: no localizing neurological complaints, no tingling, no weakness, no double vision, no gait abnormality, no slurred speech, no confusion  As per HPI otherwise 10 point review of systems negative.   Past Medical History: Past Medical History  Diagnosis Date  . Hypercholesterolemia   . Hypertension   . Mitral valve prolapse   . Mild acid reflux   . Staph infection      history of a wound infection following a previous right hip surgery  . CKD (chronic kidney disease) stage 3, GFR 30-59 ml/min   . Spinal stenosis   . Renal insufficiency    Past Surgical History  Procedure Laterality Date  . Right breast lumpectomy    . Left total hip replacement arthroplasty  June of 2001  . Right total hip replacement arthroplasty  August of 2001  . Abcess drainage    . Colonoscopy  04/30/01    [id number] hemorrhoids, otherwise, normal rectum/colon/Normal terminal ileum  . Esophagogastroduodenoscopy    03/01/2007    [id number] esophagus without evidence of Barrett's, mass/Mild erythema in the antrum.  Distorted pylorus consistent with prior ulcer disease.  Biopsies obtained/ 3. Junction of D1 and D2 erythematous,  edematous, and strictured.  The lumen was narrowed to approximately 10 mm.   . Colonoscopy  03/02/2007    [id number] sigmoid colon diverticula.  Otherwise no polyps, masses/Normal terminal ileum.  Approximately 10-15 cm of the distal terminal ileum visualized/normal view of the rectum  . Total knee arthroplasty    . Abdominal hysterectomy        Social History:  Ambulatory  Independently     reports that she has never smoked. She has never used smokeless tobacco. She reports that she does not drink alcohol or use illicit drugs.  Allergies:   Allergies  Allergen Reactions  . Demerol [Meperidine] Other (See Comments)    Altered mental status  . Meperidine Hcl Other (See Comments)    Altered mental status       Family History:    Family History  Problem Relation Age of Onset  . Colon cancer Neg Hx     Medications: Prior to Admission medications   Medication Sig Start Date End Date Taking? Authorizing Provider  albuterol (PROVENTIL HFA;VENTOLIN HFA) 108 (90 BASE) MCG/ACT inhaler Inhale 2 puffs into the lungs every 4 (four) hours as needed for wheezing or shortness of breath. 05/25/14  Yes Susy Frizzle, MD  albuterol (PROVENTIL) (2.5 MG/3ML) 0.083% nebulizer solution Take 3 mLs (2.5 mg total) by nebulization every 6 (six) hours as needed for wheezing or shortness of breath. 06/14/14  Yes Susy Frizzle, MD  amLODipine (NORVASC) 10 MG tablet Take 1 tablet (10 mg total) by mouth daily. 05/04/15  Yes Susy Frizzle, MD  atorvastatin (LIPITOR) 20 MG tablet Take 1 tablet (20 mg total) by mouth daily. 05/04/15  Yes Susy Frizzle, MD  Biotin 2500 MCG CAPS Take 2,500 mg by mouth daily.   Yes Historical Provider, MD  butalbital-aspirin-caffeine [facility]) 50-325-40 MG per capsule TAKE ONE CAPSULE BY MOUTH EVERY 8 HOURS AS NEEDED FOR HEADACHE 04/15/14  Yes Susy Frizzle, MD  cetirizine (ZYRTEC) 10 MG tablet Take 10 mg by mouth daily.   Yes Historical Provider, MD  citalopram (CELEXA) 20 MG tablet TAKE 1 TABLET DAILY 11/04/14  Yes Susy Frizzle, MD  cyclobenzaprine (FLEXERIL) 5 MG tablet Take 2.5-5 mg by mouth 3 (three) times daily as needed for muscle spasms.   Yes Historical Provider, MD  fish oil-omega-3 fatty acids 1000 MG capsule Take 2 g by mouth daily.   Yes Historical Provider, MD  gabapentin (NEURONTIN) 300  MG capsule Take 1 capsule (300 mg total) by mouth 3 (three) times daily. 01/10/15  Yes Susy Frizzle, MD  HYDROcodone-acetaminophen (NORCO/VICODIN) 5-325 MG per tablet Take 1 tablet by mouth every 4 (four) hours as needed for moderate pain. Patient taking differently: Take 0.5-1 tablets by mouth every 4 (four) hours as needed for moderate pain.  12/18/13  Yes Susy Frizzle, MD  losartan-hydrochlorothiazide (HYZAAR) 50-12.5 MG tablet Take 1 tablet by mouth daily. 01/10/15  Yes Susy Frizzle, MD  montelukast (SINGULAIR) 10 MG tablet Take 1 tablet (10 mg total) by mouth at bedtime. 11/01/14  Yes Susy Frizzle, MD  Multiple Vitamin (MULTIVITAMIN) capsule Take 1 capsule by mouth daily.   Yes Historical Provider, MD  vitamin B-12 (CYANOCOBALAMIN) 500 MCG tablet Take 500 mcg by mouth daily.   Yes Historical Provider, MD  clotrimazole-betamethasone (LOTRISONE) cream Apply 1 application topically 2 (two) times daily. Patient taking differently: Apply 1 application topically 2 (two) times daily as needed (for irritation).  07/02/13   Susy Frizzle, MD  Physical Exam: Patient Vitals for the past 24 hrs:  BP Temp Temp src Pulse Resp SpO2 Height Weight  09/03/15 2100 160/68 mmHg - - 99 21 95 % - -  09/03/15 2000 157/74 mmHg - - 98 25 96 % - -  09/03/15 1930 164/64 mmHg - - 96 21 95 % - -  09/03/15 1900 161/64 mmHg - - 97 25 96 % - -  09/03/15 1845 167/71 mmHg - - 99 21 96 % - -  09/03/15 1830 170/67 mmHg - - 101 (!) 27 90 % - -  09/03/15 1815 183/74 mmHg - - 105 (!) 27 99 % - -  09/03/15 1806 181/83 mmHg 99.2 F (37.3 C) Oral 104 (!) 29 99 % 5' 3.5" (1.613 m) 72.576 kg (160 lb)  09/03/15 1803 - - - - - 99 % - -    1. General:  Increased work of breathing, not able to finish sentances 2. Psychological: Alert and    Oriented 3. Head/ENT:    Dry Mucous Membranes                          Head Non traumatic, neck supple                          Normal   Dentition 4. SKIN:  decreased Skin  turgor,  Skin clean Dry and intact no rash 5. Heart: Regular rate and rhythm no Murmur, Rub or gallop 6. Lungs:  Expiratory and inspiratory wheezes no crackles   7. Abdomen: Soft, non-tender, Non distended 8. Lower extremities: no clubbing, cyanosis, or edema 9. Neurologically Grossly intact, moving all 4 extremities equally 10. MSK: Normal range of motion   body mass index is 27.89 kg/(m^2).  Labs on Admission:   Labs on Admission: I have personally reviewed following labs and imaging studies  CBC:  Recent Labs Lab 09/03/15 1815  WBC 13.8*  NEUTROABS 10.4*  HGB 14.6  HCT 43.7  MCV 82.8  PLT [id number]   Basic Metabolic Panel:  Recent Labs Lab 09/03/15 1815  NA 134*  K 3.0*  CL 98*  CO2 25  GLUCOSE 200*  BUN 13  CREATININE 1.09*  CALCIUM 9.5   GFR: Estimated Creatinine Clearance: 39.1 mL/min (by C-G formula based on Cr of 1.09). Liver Function Tests:  Recent Labs Lab 09/03/15 1815  AST 29  ALT 41  ALKPHOS 130*  BILITOT 0.6  PROT 7.4  ALBUMIN 4.2   No results for input(s): LIPASE, AMYLASE in the last 168 hours. No results for input(s): AMMONIA in the last 168 hours. Coagulation Profile: No results for input(s): INR, PROTIME in the last 168 hours. Cardiac Enzymes: No results for input(s): CKTOTAL, CKMB, CKMBINDEX, TROPONINI in the last 168 hours. BNP (last 3 results) No results for input(s): PROBNP in the last 8760 hours. HbA1C: No results for input(s): HGBA1C in the last 72 hours. CBG: No results for input(s): GLUCAP in the last 168 hours. Lipid Profile: No results for input(s): CHOL, HDL, LDLCALC, TRIG, CHOLHDL, LDLDIRECT in the last 72 hours. Thyroid Function Tests: No results for input(s): TSH, T4TOTAL, FREET4, T3FREE, THYROIDAB in the last 72 hours. Anemia Panel: No results for input(s): VITAMINB12, FOLATE, FERRITIN, TIBC, IRON, RETICCTPCT in the last 72 hours. Urine analysis:    Component Value Date/Time   COLORURINE YELLOW 09/02/2007 1301    APPEARANCEUR CLEAR 09/02/2007 1301   LABSPEC 1.015 09/02/2007 1301  PHURINE 5.5 09/02/2007 1301   GLUCOSEU NEGATIVE 09/02/2007 1301   HGBUR NEGATIVE 09/02/2007 1301   BILIRUBINUR NEGATIVE 09/02/2007 1301   KETONESUR NEGATIVE 09/02/2007 1301   PROTEINUR NEGATIVE 09/02/2007 1301   UROBILINOGEN 0.2 09/02/2007 1301   NITRITE NEGATIVE 09/02/2007 1301   LEUKOCYTESUR  09/02/2007 1301    NEGATIVE MICROSCOPIC NOT DONE ON URINES WITH NEGATIVE PROTEIN, BLOOD, LEUKOCYTES, NITRITE, OR GLUCOSE <1000 mg/dL.   Sepsis Labs: @LABRCNTIP (procalcitonin:4,lacticidven:4) )No results found for this or any previous visit (from the past 240 hour(s)).     UA not ordered  No results found for: HGBA1C  Estimated Creatinine Clearance: 39.1 mL/min (by C-G formula based on Cr of 1.09).  BNP (last 3 results) No results for input(s): PROBNP in the last 8760 hours.   ECG REPORT  Independently reviewed Rate:104  Rhythm: Sinus Tachycardia ST&T Change: No acute ischemic changes   QTC 470  Filed Weights   09/03/15 1806  Weight: 72.576 kg (160 lb)     Cultures:    Component Value Date/Time   SDES URINE, CLEAN CATCH 08/14/2007 1356   SPECREQUEST NONE 08/14/2007 1356   CULT  08/14/2007 1356    Multiple bacterial morphotypes present, none predominant. Suggest appropriate recollection if clinically indicated.   REPTSTATUS 08/16/2007 FINAL 08/14/2007 1356     Radiological Exams on Admission: Ct Angio Chest Pe W Or Wo Contrast  09/03/2015  CLINICAL DATA:  Shortness of breath.  Stage 3 kidney disease. EXAM: CT ANGIOGRAPHY CHEST WITH CONTRAST TECHNIQUE: Multidetector CT imaging of the chest was performed using the standard protocol during bolus administration of intravenous contrast. Multiplanar CT image reconstructions and MIPs were obtained to evaluate the vascular anatomy. CONTRAST:  75 mL Isovue 370 IV COMPARISON:  CT abdomen 06/10/2007 FINDINGS: Lungs are adequately inflated with minimal dependent  atelectasis over the right base. Triangular intrapulmonary lymph node over the minor fissure. No lobar consolidation or effusion. Mild AP narrowing of the trachea and central bronchi. Heart is normal size. Calcified atherosclerotic plaque over the left anterior descending coronary artery. Mild calcified plaque over the thoracic aorta. No evidence of pulmonary embolism. No significant hilar or mediastinal adenopathy. Remaining mediastinal structures are within normal. Images through the upper abdomen demonstrate no focal abnormality. There are degenerative changes of the spine. Stable mild L2 compression deformity. Review of the MIP images confirms the above findings. IMPRESSION: No evidence of pulmonary embolism. No acute cardiopulmonary disease. Minimal atherosclerotic coronary artery disease. Electronically Signed   By: Marin Olp M.D.   On: 09/03/2015 21:18   Dg Chest Port 1 View  09/03/2015  CLINICAL DATA:  Patient with wheezing and shortness of breath. EXAM: PORTABLE CHEST 1 VIEW COMPARISON:  Chest radiograph 05/22/2014. FINDINGS: Multiple monitoring leads overlie the patient. Stable cardiac and mediastinal contours with tortuosity of the thoracic aorta. Interval development of bilateral mid and lower lung heterogeneous opacities. No pleural effusion or pneumothorax. IMPRESSION: Bilateral mid and lower lung heterogeneous opacities favored to represent atelectasis. Infection not excluded. Electronically Signed   By: Lovey Newcomer M.D.   On: 09/03/2015 18:23    Chart has been reviewed    Assessment/Plan  82 y.o. female with medical history significant of CKDy, HTN spinal stenosis  GERD, HL, here with asthma exacerbation  Present on Admission:  . Asthma exacerbation -  - Will initiate Steroid taper,   Albuterol PRN, scheduled duonebs, Dulera and Mucinex. Titrate O2 to saturation >90%. Follow patients respiratory status in stepdown since she still have increased work of breathing. Will  give  Continuous neb now. If no significant improvement will get pulmonology consult in the morning  . CKD (chronic kidney disease) stage 3, GFR 30-59 ml/min - stable creatinine and continue to monitor  . Hypoxemia in a setting of asthma we'll check ABG to make sure patient is not tiring out  . Hypokalemia will replace    Other plan as per orders.  DVT prophylaxis:    Lovenox     Code Status:  FULL CODE as per patient    Family Communication:   Family   at  Bedside  plan of care was discussed with   Reinaldo Meeker Hagedorn [phone], Daughter Meliton Rattan [phone]   Disposition Plan:   To home once workup is complete and patient is stable   Consults called: Notify pulmonology the patient is being transferred to stepdown for elink consult  Admission status:  inpatient     Level of care   tele          [place] 09/03/2015, 10:38 PM    [facility]  Pager [phone]   after 2 AM please page floor coverage PA If 7AM-7PM, please contact the day team taking care of the patient  [email or web address]  Password [email or web address]

## 2015-09-04 ENCOUNTER — Other Ambulatory Visit: Payer: Self-pay

## 2015-09-04 DIAGNOSIS — R296 Repeated falls: Secondary | ICD-10-CM

## 2015-09-04 LAB — MRSA PCR SCREENING: MRSA BY PCR: NEGATIVE

## 2015-09-04 LAB — COMPREHENSIVE METABOLIC PANEL
ALT: 39 U/L (ref 14–54)
ANION GAP: 14 (ref 5–15)
AST: 28 U/L (ref 15–41)
Albumin: 3.9 g/dL (ref 3.5–5.0)
Alkaline Phosphatase: 119 U/L (ref 38–126)
BUN: 17 mg/dL (ref 6–20)
CALCIUM: 9.7 mg/dL (ref 8.9–10.3)
CHLORIDE: 97 mmol/L — AB (ref 101–111)
CO2: 23 mmol/L (ref 22–32)
CREATININE: 1.3 mg/dL — AB (ref 0.44–1.00)
GFR, EST AFRICAN AMERICAN: 43 mL/min — AB (ref >60)
GFR, EST NON AFRICAN AMERICAN: 37 mL/min — AB (ref >60)
Glucose, Bld: 212 mg/dL — ABNORMAL HIGH (ref 65–99)
Potassium: 3.1 mmol/L — ABNORMAL LOW (ref 3.5–5.1)
Sodium: 134 mmol/L — ABNORMAL LOW (ref 135–145)
Total Bilirubin: 0.4 mg/dL (ref 0.3–1.2)
Total Protein: 7.2 g/dL (ref 6.5–8.1)

## 2015-09-04 LAB — CBC
HCT: 43.9 % (ref 36.0–46.0)
Hemoglobin: 14.5 g/dL (ref 12.0–15.0)
MCH: 27.1 pg (ref 26.0–34.0)
MCHC: 33 g/dL (ref 30.0–36.0)
MCV: 82.1 fL (ref 78.0–100.0)
PLATELETS: 352 10*3/uL (ref 150–400)
RBC: 5.35 MIL/uL — ABNORMAL HIGH (ref 3.87–5.11)
RDW: 14 % (ref 11.5–15.5)
WBC: 7.2 10*3/uL (ref 4.0–10.5)

## 2015-09-04 LAB — GLUCOSE, CAPILLARY
GLUCOSE-CAPILLARY: 170 mg/dL — AB (ref 65–99)
GLUCOSE-CAPILLARY: 169 mg/dL — AB (ref 65–99)

## 2015-09-04 LAB — CBG MONITORING, ED: GLUCOSE-CAPILLARY: 164 mg/dL — AB (ref 65–99)

## 2015-09-04 LAB — MAGNESIUM: MAGNESIUM: 2.7 mg/dL — AB (ref 1.7–2.4)

## 2015-09-04 LAB — TSH: TSH: 1.151 u[IU]/mL (ref 0.350–4.500)

## 2015-09-04 LAB — PHOSPHORUS: Phosphorus: 3 mg/dL (ref 2.5–4.6)

## 2015-09-04 MED ORDER — IPRATROPIUM-ALBUTEROL 0.5-2.5 (3) MG/3ML IN SOLN
3.0000 mL | Freq: Four times a day (QID) | RESPIRATORY_TRACT | Status: DC
  Administered 2015-09-04 (×2): 3 mL via RESPIRATORY_TRACT
  Filled 2015-09-04: qty 3

## 2015-09-04 MED ORDER — ONDANSETRON HCL 4 MG PO TABS
4.0000 mg | ORAL_TABLET | Freq: Four times a day (QID) | ORAL | Status: DC | PRN
  Administered 2015-09-06: 4 mg via ORAL
  Filled 2015-09-04: qty 1

## 2015-09-04 MED ORDER — INSULIN ASPART 100 UNIT/ML ~~LOC~~ SOLN
0.0000 [IU] | Freq: Three times a day (TID) | SUBCUTANEOUS | Status: DC
  Administered 2015-09-04: 3 [IU] via SUBCUTANEOUS
  Administered 2015-09-05: 2 [IU] via SUBCUTANEOUS
  Administered 2015-09-05: 5 [IU] via SUBCUTANEOUS
  Administered 2015-09-05: 3 [IU] via SUBCUTANEOUS
  Administered 2015-09-06: 5 [IU] via SUBCUTANEOUS
  Administered 2015-09-06 – 2015-09-07 (×3): 3 [IU] via SUBCUTANEOUS
  Administered 2015-09-07 – 2015-09-08 (×4): 2 [IU] via SUBCUTANEOUS
  Administered 2015-09-09: 3 [IU] via SUBCUTANEOUS
  Administered 2015-09-09 (×2): 2 [IU] via SUBCUTANEOUS
  Administered 2015-09-10: 3 [IU] via SUBCUTANEOUS
  Administered 2015-09-10: 2 [IU] via SUBCUTANEOUS
  Administered 2015-09-11: 3 [IU] via SUBCUTANEOUS
  Administered 2015-09-11 – 2015-09-12 (×2): 2 [IU] via SUBCUTANEOUS
  Administered 2015-09-12: 3 [IU] via SUBCUTANEOUS
  Administered 2015-09-13 – 2015-09-16 (×4): 2 [IU] via SUBCUTANEOUS

## 2015-09-04 MED ORDER — ACETAMINOPHEN 650 MG RE SUPP: 650.0000 mg | Freq: Four times a day (QID) | RECTAL | Status: DC | PRN

## 2015-09-04 MED ORDER — ONDANSETRON HCL 4 MG/2ML IJ SOLN
4.0000 mg | Freq: Four times a day (QID) | INTRAMUSCULAR | Status: DC | PRN
  Administered 2015-09-04 – 2015-09-08 (×3): 4 mg via INTRAVENOUS
  Filled 2015-09-04 (×3): qty 2

## 2015-09-04 MED ORDER — AMLODIPINE BESYLATE 10 MG PO TABS
10.0000 mg | ORAL_TABLET | Freq: Every day | ORAL | Status: DC
  Administered 2015-09-04 – 2015-09-16 (×13): 10 mg via ORAL
  Filled 2015-09-04 (×4): qty 1
  Filled 2015-09-04: qty 2
  Filled 2015-09-04 (×8): qty 1

## 2015-09-04 MED ORDER — HYDRALAZINE HCL 20 MG/ML IJ SOLN
10.0000 mg | INTRAMUSCULAR | Status: DC | PRN
  Administered 2015-09-04 – 2015-09-07 (×4): 10 mg via INTRAVENOUS
  Filled 2015-09-04 (×3): qty 1

## 2015-09-04 MED ORDER — LORATADINE 10 MG PO TABS
10.0000 mg | ORAL_TABLET | Freq: Every day | ORAL | Status: DC
  Administered 2015-09-04 – 2015-09-16 (×13): 10 mg via ORAL
  Filled 2015-09-04 (×13): qty 1

## 2015-09-04 MED ORDER — HYDROCODONE-ACETAMINOPHEN 5-325 MG PO TABS: 0.5000 | ORAL_TABLET | ORAL | Status: DC | PRN

## 2015-09-04 MED ORDER — CITALOPRAM HYDROBROMIDE 20 MG PO TABS
20.0000 mg | ORAL_TABLET | Freq: Every day | ORAL | Status: DC
  Administered 2015-09-04 – 2015-09-08 (×5): 20 mg via ORAL
  Filled 2015-09-04 (×3): qty 1
  Filled 2015-09-04: qty 2
  Filled 2015-09-04: qty 1

## 2015-09-04 MED ORDER — MONTELUKAST SODIUM 10 MG PO TABS
10.0000 mg | ORAL_TABLET | Freq: Every day | ORAL | Status: DC
  Administered 2015-09-04 – 2015-09-15 (×10): 10 mg via ORAL
  Filled 2015-09-04 (×13): qty 1

## 2015-09-04 MED ORDER — ALBUTEROL SULFATE (2.5 MG/3ML) 0.083% IN NEBU
2.5000 mg | INHALATION_SOLUTION | RESPIRATORY_TRACT | Status: DC | PRN
  Administered 2015-09-04: 2.5 mg via RESPIRATORY_TRACT
  Filled 2015-09-04: qty 3

## 2015-09-04 MED ORDER — IPRATROPIUM-ALBUTEROL 0.5-2.5 (3) MG/3ML IN SOLN
RESPIRATORY_TRACT | Status: AC
  Administered 2015-09-04: 3 mL via RESPIRATORY_TRACT
  Filled 2015-09-04: qty 3

## 2015-09-04 MED ORDER — POTASSIUM CHLORIDE 10 MEQ/100ML IV SOLN
10.0000 meq | INTRAVENOUS | Status: AC
  Administered 2015-09-04 (×4): 10 meq via INTRAVENOUS
  Filled 2015-09-04 (×4): qty 100

## 2015-09-04 MED ORDER — SODIUM CHLORIDE 0.9% FLUSH
3.0000 mL | Freq: Two times a day (BID) | INTRAVENOUS | Status: DC
  Administered 2015-09-04 – 2015-09-13 (×19): 3 mL via INTRAVENOUS
  Administered 2015-09-14: 22:00:00 via INTRAVENOUS
  Administered 2015-09-14 – 2015-09-15 (×3): 3 mL via INTRAVENOUS

## 2015-09-04 MED ORDER — MOMETASONE FURO-FORMOTEROL FUM 100-5 MCG/ACT IN AERO
2.0000 | INHALATION_SPRAY | Freq: Two times a day (BID) | RESPIRATORY_TRACT | Status: DC
  Filled 2015-09-04 (×2): qty 8.8

## 2015-09-04 MED ORDER — GABAPENTIN 300 MG PO CAPS
300.0000 mg | ORAL_CAPSULE | Freq: Three times a day (TID) | ORAL | Status: DC
  Administered 2015-09-04 – 2015-09-08 (×14): 300 mg via ORAL
  Filled 2015-09-04 (×15): qty 1

## 2015-09-04 MED ORDER — SODIUM CHLORIDE 0.9 % IV SOLN
INTRAVENOUS | Status: DC
  Administered 2015-09-04: 03:00:00 via INTRAVENOUS

## 2015-09-04 MED ORDER — ACETAMINOPHEN 325 MG PO TABS
650.0000 mg | ORAL_TABLET | Freq: Four times a day (QID) | ORAL | Status: DC | PRN
Start: 2015-09-04 — End: 2015-09-16
  Administered 2015-09-06 – 2015-09-15 (×4): 650 mg via ORAL
  Filled 2015-09-04 (×4): qty 2

## 2015-09-04 MED ORDER — POTASSIUM CHLORIDE CRYS ER 20 MEQ PO TBCR
40.0000 meq | EXTENDED_RELEASE_TABLET | Freq: Once | ORAL | Status: AC
  Administered 2015-09-04: 40 meq via ORAL
  Filled 2015-09-04: qty 2

## 2015-09-04 MED ORDER — ATORVASTATIN CALCIUM 20 MG PO TABS
20.0000 mg | ORAL_TABLET | Freq: Every day | ORAL | Status: DC
  Administered 2015-09-04 – 2015-09-16 (×13): 20 mg via ORAL
  Filled 2015-09-04 (×2): qty 1
  Filled 2015-09-04: qty 2
  Filled 2015-09-04 (×10): qty 1

## 2015-09-04 MED ORDER — METHYLPREDNISOLONE SODIUM SUCC 125 MG IJ SOLR
60.0000 mg | Freq: Four times a day (QID) | INTRAMUSCULAR | Status: DC
  Administered 2015-09-04 – 2015-09-06 (×9): 60 mg via INTRAVENOUS
  Filled 2015-09-04 (×9): qty 2

## 2015-09-04 MED ORDER — HYDROCODONE-ACETAMINOPHEN 5-325 MG PO TABS
1.0000 | ORAL_TABLET | ORAL | Status: DC | PRN
  Administered 2015-09-07 – 2015-09-08 (×3): 1 via ORAL
  Filled 2015-09-04 (×3): qty 1

## 2015-09-04 MED ORDER — GUAIFENESIN ER 600 MG PO TB12
600.0000 mg | ORAL_TABLET | Freq: Two times a day (BID) | ORAL | Status: DC
  Administered 2015-09-04 – 2015-09-16 (×24): 600 mg via ORAL
  Filled 2015-09-04 (×24): qty 1

## 2015-09-04 MED ORDER — ENOXAPARIN SODIUM 40 MG/0.4ML ~~LOC~~ SOLN
40.0000 mg | SUBCUTANEOUS | Status: DC
  Administered 2015-09-04 – 2015-09-07 (×4): 40 mg via SUBCUTANEOUS
  Filled 2015-09-04 (×5): qty 0.4

## 2015-09-04 MED ORDER — IPRATROPIUM-ALBUTEROL 0.5-2.5 (3) MG/3ML IN SOLN
3.0000 mL | RESPIRATORY_TRACT | Status: DC
  Administered 2015-09-04 – 2015-09-06 (×14): 3 mL via RESPIRATORY_TRACT
  Filled 2015-09-04 (×14): qty 3

## 2015-09-04 MED ORDER — CYCLOBENZAPRINE HCL 5 MG PO TABS: 5.0000 mg | ORAL_TABLET | Freq: Three times a day (TID) | ORAL | Status: DC | PRN

## 2015-09-04 NOTE — Progress Notes (Signed)
RT called to patient room to assess patient.  Patient is noted to have audible wheezing.  Sats 96% and vitals were stable.  Patient has stated that she feels as though she has sputum she needs to cough up however she just can't get it out.  Gave patient PRN Albuterol through flutter valve and once treatment was finished patient stated that she felt as though that helped her the most.  Will continue to give treatments through flutter for time being.  Also gave patient incentive spirometer and patient performed with good effort with achieved goal of 1000L.  Will continue to monitor.

## 2015-09-04 NOTE — Progress Notes (Signed)
PROGRESS NOTE    [name]  [id number]  DOB: 29-Oct-1932  DOA: 09/03/2015 PCP: Odette Fraction, MD Outpatient Specialists:   Hospital course: Rillie P Kush is a 82 y.o. female with medical history significant of CKD , HTN spinal stenosis, GERD, HL, Asthma Presented with severe dyspnea wheezing over the past 3 days has been getting worse gradually in onset, reports history of asthma although this has not been charted in the past Includes that she can barely moving air. Presented to urgent care was found to be hypoxic down to 80% and sent to emergency department received continuous nebulizer albuterol 1 mg of Atrovent and 125 of Solu-Medrol on arrival Reports some non-productive cough initially was fairly short of breath now after nebulizer treatment somewhat improved though not able to finish sentences.   Assessment & Plan:   Severe Acute Asthma exacerbation -Continue IV steroid taper, slowly improving clinically but still using accessory muscles, Albuterol PRN, scheduled duonebs, Dulera and Mucinex. Titrate O2 to saturation >90%. Follow patients respiratory status in stepdown since she still have increased work of breathing and advanced age.  If no significant improvement will get pulmonology consult.  Hypertension and CKD (chronic kidney disease) stage 3, GFR 30-59 ml/min - stable creatinine and continue to monitor, blood pressure suboptimally controlled, will add pRN meds.  Hypoxemia in a setting of asthma, improved with latest ABG.  Hypokalemia - repleted Falls - Pt reports gait instability at home with several recent falls, will place on fall precautions, get PT evaluation   DVT prophylaxis: enoxaparin Code Status: Full  Family Communication: bedside  Subjective: Pt still having a lot of cough but nebs seem to be helping SOB.   Objective: Filed Vitals:   09/04/15 1045 09/04/15 1115 09/04/15 1144 09/04/15 1145  BP: 165/84 188/91 173/80 177/86  Pulse: 91 96   95  Temp:      TempSrc:      Resp: 26 21  22   Height:      Weight:      SpO2: 93% 97%  96%   No intake or output data in the 24 hours ending 09/04/15 1217 Filed Weights   09/03/15 1806  Weight: 160 lb (72.576 kg)    Exam:  General exam: awake, alert, no distress Respiratory system: tight bilateral with diffuse wheezing. Cardiovascular system: S1 & S2 heard, RRR. No JVD, murmurs, gallops, clicks or pedal edema. Gastrointestinal system: Abdomen is nondistended, soft and nontender. Normal bowel sounds heard. Central nervous system: Alert and oriented. No focal neurological deficits. Extremities: no cyanosis.  Data Reviewed: Basic Metabolic Panel:  Recent Labs Lab 09/03/15 1815 09/04/15 0400  NA 134* 134*  K 3.0* 3.1*  CL 98* 97*  CO2 25 23  GLUCOSE 200* 212*  BUN 13 17  CREATININE 1.09* 1.30*  CALCIUM 9.5 9.7  MG  --  2.7*  PHOS  --  3.0   Liver Function Tests:  Recent Labs Lab 09/03/15 1815 09/04/15 0400  AST 29 28  ALT 41 39  ALKPHOS 130* 119  BILITOT 0.6 0.4  PROT 7.4 7.2  ALBUMIN 4.2 3.9   No results for input(s): LIPASE, AMYLASE in the last 168 hours. No results for input(s): AMMONIA in the last 168 hours. CBC:  Recent Labs Lab 09/03/15 1815 09/04/15 0400  WBC 13.8* 7.2  NEUTROABS 10.4*  --   HGB 14.6 14.5  HCT 43.7 43.9  MCV 82.8 82.1  PLT 351 352   Cardiac Enzymes: No results for input(s): CKTOTAL, CKMB, CKMBINDEX,  TROPONINI in the last 168 hours. BNP (last 3 results) No results for input(s): PROBNP in the last 8760 hours. CBG: No results for input(s): GLUCAP in the last 168 hours.  No results found for this or any previous visit (from the past 240 hour(s)).   Studies: Ct Angio Chest Pe W Or Wo Contrast  09/03/2015  CLINICAL DATA:  Shortness of breath.  Stage 3 kidney disease. EXAM: CT ANGIOGRAPHY CHEST WITH CONTRAST TECHNIQUE: Multidetector CT imaging of the chest was performed using the standard protocol during bolus administration  of intravenous contrast. Multiplanar CT image reconstructions and MIPs were obtained to evaluate the vascular anatomy. CONTRAST:  75 mL Isovue 370 IV COMPARISON:  CT abdomen 06/10/2007 FINDINGS: Lungs are adequately inflated with minimal dependent atelectasis over the right base. Triangular intrapulmonary lymph node over the minor fissure. No lobar consolidation or effusion. Mild AP narrowing of the trachea and central bronchi. Heart is normal size. Calcified atherosclerotic plaque over the left anterior descending coronary artery. Mild calcified plaque over the thoracic aorta. No evidence of pulmonary embolism. No significant hilar or mediastinal adenopathy. Remaining mediastinal structures are within normal. Images through the upper abdomen demonstrate no focal abnormality. There are degenerative changes of the spine. Stable mild L2 compression deformity. Review of the MIP images confirms the above findings. IMPRESSION: No evidence of pulmonary embolism. No acute cardiopulmonary disease. Minimal atherosclerotic coronary artery disease. Electronically Signed   By: Marin Olp M.D.   On: 09/03/2015 21:18   Dg Chest Port 1 View  09/03/2015  CLINICAL DATA:  Patient with wheezing and shortness of breath. EXAM: PORTABLE CHEST 1 VIEW COMPARISON:  Chest radiograph 05/22/2014. FINDINGS: Multiple monitoring leads overlie the patient. Stable cardiac and mediastinal contours with tortuosity of the thoracic aorta. Interval development of bilateral mid and lower lung heterogeneous opacities. No pleural effusion or pneumothorax. IMPRESSION: Bilateral mid and lower lung heterogeneous opacities favored to represent atelectasis. Infection not excluded. Electronically Signed   By: Lovey Newcomer M.D.   On: 09/03/2015 18:23     Scheduled Meds: . amLODipine  10 mg Oral Daily  . atorvastatin  20 mg Oral Daily  . citalopram  20 mg Oral Daily  . enoxaparin (LOVENOX) injection  40 mg Subcutaneous Q24H  . gabapentin  300 mg Oral  TID  . guaiFENesin  600 mg Oral BID  . ipratropium-albuterol  3 mL Nebulization Q4H  . loratadine  10 mg Oral Daily  . methylPREDNISolone (SOLU-MEDROL) injection  60 mg Intravenous Q6H  . mometasone-formoterol  2 puff Inhalation BID  . montelukast  10 mg Oral QHS  . sodium chloride flush  3 mL Intravenous Q12H   Continuous Infusions: . sodium chloride 100 mL/hr at 09/04/15 0329  . albuterol 10 mg/hr (09/03/15 2246)    Active Problems:   CKD (chronic kidney disease) stage 3, GFR 30-59 ml/min   Hypoxemia   Hypokalemia   Asthma exacerbation   Time spent:    Irwin Brakeman, MD, FAAFP [facility] Pager [phone]  If 7PM-7AM, please contact night-coverage [email or web address] Password [email or web address] 09/04/2015, 12:17 PM    LOS: 1 day

## 2015-09-05 DIAGNOSIS — J9601 Acute respiratory failure with hypoxia: Secondary | ICD-10-CM

## 2015-09-05 LAB — COMPREHENSIVE METABOLIC PANEL
ALT: 46 U/L (ref 14–54)
ANION GAP: 12 (ref 5–15)
AST: 43 U/L — ABNORMAL HIGH (ref 15–41)
Albumin: 3.8 g/dL (ref 3.5–5.0)
Alkaline Phosphatase: 104 U/L (ref 38–126)
BILIRUBIN TOTAL: 0.4 mg/dL (ref 0.3–1.2)
BUN: 33 mg/dL — ABNORMAL HIGH (ref 6–20)
CO2: 21 mmol/L — AB (ref 22–32)
Calcium: 9.6 mg/dL (ref 8.9–10.3)
Chloride: 99 mmol/L — ABNORMAL LOW (ref 101–111)
Creatinine, Ser: 1.42 mg/dL — ABNORMAL HIGH (ref 0.44–1.00)
GFR calc non Af Amer: 34 mL/min — ABNORMAL LOW (ref >60)
GFR, EST AFRICAN AMERICAN: 39 mL/min — AB (ref >60)
GLUCOSE: 170 mg/dL — AB (ref 65–99)
POTASSIUM: 4.1 mmol/L (ref 3.5–5.1)
SODIUM: 132 mmol/L — AB (ref 135–145)
TOTAL PROTEIN: 7 g/dL (ref 6.5–8.1)

## 2015-09-05 LAB — GLUCOSE, CAPILLARY
GLUCOSE-CAPILLARY: 170 mg/dL — AB (ref 65–99)
GLUCOSE-CAPILLARY: 167 mg/dL — AB (ref 65–99)
Glucose-Capillary: 207 mg/dL — ABNORMAL HIGH (ref 65–99)
Glucose-Capillary: 148 mg/dL — ABNORMAL HIGH (ref 65–99)

## 2015-09-05 LAB — HEMOGLOBIN A1C
Hgb A1c MFr Bld: 5.6 % (ref 4.8–5.6)
MEAN PLASMA GLUCOSE: 114 mg/dL

## 2015-09-05 LAB — MAGNESIUM: Magnesium: 2.4 mg/dL (ref 1.7–2.4)

## 2015-09-05 MED ORDER — LABETALOL HCL 200 MG PO TABS: 200.0000 mg | ORAL_TABLET | Freq: Two times a day (BID) | ORAL | Status: DC

## 2015-09-05 MED ORDER — LABETALOL HCL 100 MG PO TABS
100.0000 mg | ORAL_TABLET | Freq: Two times a day (BID) | ORAL | Status: DC
  Administered 2015-09-05 – 2015-09-07 (×5): 100 mg via ORAL
  Filled 2015-09-05 (×5): qty 1

## 2015-09-05 MED ORDER — IPRATROPIUM-ALBUTEROL 0.5-2.5 (3) MG/3ML IN SOLN: 3.0000 mL | RESPIRATORY_TRACT | Status: DC | PRN

## 2015-09-05 NOTE — Progress Notes (Addendum)
Patient ID: [name], female   DOB: 1933-01-28, 82 y.o.   MRN: [id number]  PROGRESS NOTE    [name]  [id number] DOB: 1932-04-12 DOA: 09/03/2015  PCP: Odette Fraction, MD   Brief Narrative:  82 y.o. female with past medical history significant for hypertension, spinal stenosis, dyslipidemia, asthma. Patient presented to [facility] with worsening shortness of breath over past 3 days prior to this admission. She used at home inhalers with no significant symptomatic relief. She initially presented to urgent care and found to be hypoxic around 80s and she was referred to emergency department for further evaluation. She was admitted for management of asthma exacerbation.  Assessment & Plan:  Acute respiratory failure with hypoxia /Severe Acute Asthma exacerbation  - Hypoxia improving, patient's oxygen saturation is 95% with nasal cannula oxygen support - We will continue current nebulizer regimen with DuoNeb every 4 hours scheduled and every 4 hours as needed for shortness of breath or wheezing - Because she still wheezing we will continue current steroids, Solu-Medrol 60 mg IV every 6 hours  Accelerated hypertension - Likely exacerbated by steroids  - She is on Norvasc 10 mg daily - She also has hydralazine as needed - We will start low-dose labetalol which would probably help hypertension as well as slight tachycardia  Leukocytosis - Likely secondary to steroids - Patient not on any antibiotics, no current evidence of infectious process  Chronic kidney disease stage III - Baseline creatinine 1.4 about 10 months ago - Creatinine on this admission 1.09 and up to 1.4, overall at baseline  Hypokalemia - Likely secondary to nebulizer treatments - Potassium now within normal limits  Depression - Continue Celexa - Stable, patient not depressed  Dyslipidemia - Continue statin therapy  Steroid induced hyperglycemia - A1c is pending - Continue sliding  scale insulin  Recent falls - PT evaluation pending    DVT prophylaxis: Lovenox subQ ordered  Code Status: full code  Family Communication: called daughter Joelene Millin over the phone today, left my cell phone number for her to call me back for updates. Disposition Plan: continue to monitor in SDU due to hypertension, tachycardia, tachypnea    Consultants:   Physical therapy   Pulmonary  Procedures:   None  Antimicrobials:   None   Subjective: Says she feels little bit better this morning.  Objective: Filed Vitals:   09/05/15 0251 09/05/15 0320 09/05/15 0719 09/05/15 0800  BP: 162/92  188/82 177/86  Pulse: 105  112 105  Temp: 98.8 F (37.1 C)  98.1 F (36.7 C)   TempSrc: Oral  Oral   Resp: 33  31 32  Height:      Weight:      SpO2: 92% 95% 96% 95%    Intake/Output Summary (Last 24 hours) at 09/05/15 0854 Last data filed at 09/05/15 0251  Gross per 24 hour  Intake      0 ml  Output    300 ml  Net   -300 ml   Filed Weights   09/03/15 1806 09/04/15 1545  Weight: 72.576 kg (160 lb) 73.9 kg (162 lb 14.7 oz)    Examination:  General exam: Appears calm and comfortable  Respiratory system: Wheezing in upper lung lobes, no rhonchi Cardiovascular system: S1 & S2 heard, Slight tachycardia Gastrointestinal system: Abdomen is nondistended, soft and nontender. No organomegaly or masses felt. Normal bowel sounds heard. Central nervous system: Alert and oriented. No focal neurological deficits. Extremities: Symmetric 5 x 5 power. Skin: No  rashes, lesions or ulcers Psychiatry: Judgement and insight appear normal. Mood & affect appropriate.   Data Reviewed: I have personally reviewed following labs and imaging studies  CBC:  Recent Labs Lab 09/03/15 1815 09/04/15 0400  WBC 13.8* 7.2  NEUTROABS 10.4*  --   HGB 14.6 14.5  HCT 43.7 43.9  MCV 82.8 82.1  PLT 351 [id number]   Basic Metabolic Panel:  Recent Labs Lab 09/03/15 1815 09/04/15 0400 09/05/15 0732  NA  134* 134* 132*  K 3.0* 3.1* 4.1  CL 98* 97* 99*  CO2 25 23 21*  GLUCOSE 200* 212* 170*  BUN 13 17 33*  CREATININE 1.09* 1.30* 1.42*  CALCIUM 9.5 9.7 9.6  MG  --  2.7* 2.4  PHOS  --  3.0  --    GFR: Estimated Creatinine Clearance: 31.9 mL/min (by C-G formula based on Cr of 1.42). Liver Function Tests:  Recent Labs Lab 09/03/15 1815 09/04/15 0400 09/05/15 0732  AST 29 28 43*  ALT 41 39 46  ALKPHOS 130* 119 104  BILITOT 0.6 0.4 0.4  PROT 7.4 7.2 7.0  ALBUMIN 4.2 3.9 3.8   No results for input(s): LIPASE, AMYLASE in the last 168 hours. No results for input(s): AMMONIA in the last 168 hours. Coagulation Profile: No results for input(s): INR, PROTIME in the last 168 hours. Cardiac Enzymes: No results for input(s): CKTOTAL, CKMB, CKMBINDEX, TROPONINI in the last 168 hours. BNP (last 3 results) No results for input(s): PROBNP in the last 8760 hours. HbA1C: No results for input(s): HGBA1C in the last 72 hours. CBG:  Recent Labs Lab 09/04/15 1437 09/04/15 1708 09/04/15 2101 09/05/15 0739  GLUCAP 164* 170* 169* 170*   Lipid Profile: No results for input(s): CHOL, HDL, LDLCALC, TRIG, CHOLHDL, LDLDIRECT in the last 72 hours. Thyroid Function Tests:  Recent Labs  09/04/15 0400  TSH 1.151   Anemia Panel: No results for input(s): VITAMINB12, FOLATE, FERRITIN, TIBC, IRON, RETICCTPCT in the last 72 hours. Urine analysis:    Component Value Date/Time   COLORURINE YELLOW 09/02/2007 1301   APPEARANCEUR CLEAR 09/02/2007 1301   LABSPEC 1.015 09/02/2007 1301   PHURINE 5.5 09/02/2007 1301   GLUCOSEU NEGATIVE 09/02/2007 1301   HGBUR NEGATIVE 09/02/2007 1301   BILIRUBINUR NEGATIVE 09/02/2007 1301   KETONESUR NEGATIVE 09/02/2007 1301   PROTEINUR NEGATIVE 09/02/2007 1301   UROBILINOGEN 0.2 09/02/2007 1301   NITRITE NEGATIVE 09/02/2007 1301   LEUKOCYTESUR  09/02/2007 1301    NEGATIVE MICROSCOPIC NOT DONE ON URINES WITH NEGATIVE PROTEIN, BLOOD, LEUKOCYTES, NITRITE, OR  GLUCOSE <1000 mg/dL.   Sepsis Labs: @LABRCNTIP (procalcitonin:4,lacticidven:4)   Recent Results (from the past 240 hour(s))  MRSA PCR Screening     Status: None   Collection Time: 09/04/15  5:35 PM  Result Value Ref Range Status   MRSA by PCR NEGATIVE NEGATIVE Final      Radiology Studies: Ct Angio Chest Pe W Or Wo Contrast 09/03/2015   No evidence of pulmonary embolism. No acute cardiopulmonary disease. Minimal atherosclerotic coronary artery disease.   Dg Chest Port 1 View 09/03/2015  Bilateral mid and lower lung heterogeneous opacities favored to represent atelectasis. Infection not excluded.     Scheduled Meds: . amLODipine  10 mg Oral Daily  . atorvastatin  20 mg Oral Daily  . citalopram  20 mg Oral Daily  . enoxaparin (LOVENOX) injection  40 mg Subcutaneous Q24H  . gabapentin  300 mg Oral TID  . guaiFENesin  600 mg Oral BID  . insulin aspart  0-15 Units Subcutaneous TID WC  . ipratropium-albuterol  3 mL Nebulization Q4H  . loratadine  10 mg Oral Daily  . methylPREDNISolone (SOLU-MEDROL) injection  60 mg Intravenous Q6H  . mometasone-formoterol  2 puff Inhalation BID  . montelukast  10 mg Oral QHS   Continuous Infusions:    LOS: 2 days    Time spent: 25 minutes  Greater than 50% of the time spent on counseling and coordinating the care.   Leisa Lenz, MD [facility] Pager [phone]  If 7PM-7AM, please contact night-coverage [email or web address] Password [facility] 09/05/2015, 8:54 AM

## 2015-09-05 NOTE — Progress Notes (Signed)
NURSING PROGRESS NOTE  [name] [id number] Transfer Data: 09/05/2015 1:20 PM Attending Provider: Robbie Lis, MD [id number] TOM, MD Code Status: Full  [name] is a 82 y.o. female patient transferred from [place]  -No acute distress noted.  -No complaints of shortness of breath.  -No complaints of chest pain.   Cardiac Monitoring: Box # 7 in place.   Blood pressure 153/75, pulse 93, temperature 97.8 F (36.6 C), temperature source Oral, resp. rate 16, height 5\' 6"  (1.676 m), weight 76.9 kg (169 lb 8.5 oz), SpO2 92 %.   IV Fluids:  IV in place,SL at this time.   Allergies:  Demerol and Meperidine hcl  Past Medical History:   has a past medical history of Hypercholesterolemia; Hypertension; Mitral valve prolapse; Mild acid reflux; Staph infection; CKD (chronic kidney disease) stage 3, GFR 30-59 ml/min; Spinal stenosis; and Renal insufficiency.  Past Surgical History:   has past surgical history that includes Right breast lumpectomy; Left total hip replacement arthroplasty (June of 2001); Right total hip replacement arthroplasty (August of 2001); Abscess drainage; Colonoscopy (04/30/01); Esophagogastroduodenoscopy (  03/01/2007); Colonoscopy (03/02/2007); Total knee arthroplasty; and Abdominal hysterectomy.  Social History:   reports that she has never smoked. She has never used smokeless tobacco. She reports that she does not drink alcohol or use illicit drugs.  Skin: Intact  Patient/Family orientated to room. Information packet given to patient/family. Admission inpatient armband information verified with patient/family to include name and date of birth and placed on patient arm. Side rails up x 2, fall assessment and education completed with patient/family. Patient/family able to verbalize understanding of risk associated with falls and verbalized understanding to call for assistance before getting out of bed. Call light within reach. Patient/family able to  voice and demonstrate understanding of unit orientation instructions.    Will continue to evaluate and treat per MD orders.

## 2015-09-05 NOTE — Care Management Note (Signed)
Case Management Note  Patient Details  Name: CAELAN YARGER MRN: [id number] Date of Birth: 07/09/1932  Subjective/Objective:    Severe Acute Asthma exacerbation, HTN                 Action/Plan: Discharge Planning:  NCM spoke to pt and dtr, Meliton Rattan # [phone] at bedside. Offered choice for HH/provided list. Dtr agreeable to [facility] if [facility] ordered. Pt is currently living with dtr at her home. Pt wearing oxygen, will attempt to wean. She does not have home oxygen. Will reassess need closer to dc for oxygen needs for home. Has RW and neb machine at home.  PCP Jenna Luo MD  Expected Discharge Date:  09/07/2015               Expected Discharge Plan:  [place]  In-House Referral:  NA  Discharge planning Services  CM Consult  Post Acute Care Choice:  Home Health Choice offered to:  Adult Children  DME Arranged:   (Has RW, nebulizer machine at home) DME Agency:  NA   Status of Service:  In process, will continue to follow  Medicare Important Message Given:    Date Medicare IM Given:    Medicare IM give by:    Date Additional Medicare IM Given:    Additional Medicare Important Message give by:     If discussed at [place] of Stay Meetings, dates discussed:    Additional Comments:  Erenest Rasher, RN 09/05/2015, 10:50 AM

## 2015-09-05 NOTE — Evaluation (Signed)
Physical Therapy Evaluation Patient Details Name: [name] MRN: [id number] DOB: 1933/03/06 Today's Date: 09/05/2015   History of Present Illness  [name] is a 82 y.o. female with medical history significant of CKD , HTN spinal stenosis, GERD, HL, Asthma who presented with dyspnea, wheezing and hypoxia.  Clinical Impression  Patient presents with decreased independence with mobility due to deficits listed in PT problem list.  She will benefit from skilled PT in the acute setting to allow return home with family support and follow up HHPT.     Follow Up Recommendations Home health PT;Supervision/Assistance - 24 hour    Equipment Recommendations  None recommended by PT    Recommendations for Other Services       Precautions / Restrictions Precautions Precautions: Fall      Mobility  Bed Mobility Overal bed mobility: Needs Assistance Bed Mobility: Supine to Sit     Supine to sit: Supervision     General bed mobility comments: for lines, safety  Transfers Overall transfer level: Needs assistance Equipment used: Rolling walker (2 wheeled) Transfers: Sit to/from Stand Sit to Stand: Min guard         General transfer comment: for safety, balance  Ambulation/Gait Ambulation/Gait assistance: Min guard Ambulation Distance (Feet): 90 Feet Assistive device: Rolling walker (2 wheeled) Gait Pattern/deviations: Step-through pattern;Decreased stride length     General Gait Details: one LOB initially walking, but regained with little assist; SpO2 maintained in 90's ambulating with 3L O2  Stairs            Wheelchair Mobility    Modified Rankin (Stroke Patients Only)       Balance Overall balance assessment: Needs assistance   Sitting balance-Leahy Scale: Good       Standing balance-Leahy Scale: Fair                               Pertinent Vitals/Pain Pain Assessment: No/denies pain    Home Living Family/patient expects to  be discharged to:: Private residence Living Arrangements: Children Available Help at Discharge: Family;Available 24 hours/day Type of Home: House Home Access: Stairs to enter   [employer] of Steps: 3 at daughter's home with railing, (ramp at pt's home) Home Layout: One level Home Equipment: Walker - 2 wheels;Shower seat      Prior Function Level of Independence: Independent         Comments: driving, cooking, cleaning, caring for infant great grandson at daughter's home few days a week     Hand Dominance        Extremity/Trunk Assessment   Upper Extremity Assessment: Generalized weakness           Lower Extremity Assessment: Generalized weakness      Cervical / Trunk Assessment: Normal  Communication   Communication: No difficulties  Cognition Arousal/Alertness: Awake/alert Behavior During Therapy: WFL for tasks assessed/performed (RN reports pt impulsive) Overall Cognitive Status: Within Functional Limits for tasks assessed       Memory: Decreased short-term memory              General Comments General comments (skin integrity, edema, etc.): daughter in room and relates though she works will be taking off to assist her mom with d/c, transition home    Exercises        Assessment/Plan    PT Assessment Patient needs continued PT services  PT Diagnosis Generalized weakness;Abnormality of gait   PT Problem List  Decreased activity tolerance;Decreased balance;Decreased mobility;Decreased safety awareness;Cardiopulmonary status limiting activity;Decreased knowledge of use of DME;Decreased strength  PT Treatment Interventions DME instruction;Balance training;Gait training;Stair training;Functional mobility training;Patient/family education;Therapeutic activities;Therapeutic exercise   PT Goals (Current goals can be found in the Care Plan section) Acute Rehab PT Goals Patient Stated Goal: To return to independent PT Goal Formulation: With  patient/family Time For Goal Achievement: 09/19/15 Potential to Achieve Goals: Good    Frequency Min 3X/week   Barriers to discharge        Co-evaluation               End of Session Equipment Utilized During Treatment: Gait belt Activity Tolerance: Patient tolerated treatment well Patient left: in chair;with chair alarm set;with call bell/phone within reach;with family/visitor present           Time: 0910-0940 PT Time Calculation (min) (ACUTE ONLY): 30 min   Charges:   PT Evaluation $PT Eval Moderate Complexity: 1 Procedure PT Treatments $Gait Training: 8-22 mins   PT G CodesReginia Naas 2015-09-16, 11:26 AM  Magda Kiel, [place] 2015/09/16

## 2015-09-06 DIAGNOSIS — D72829 Elevated white blood cell count, unspecified: Secondary | ICD-10-CM

## 2015-09-06 LAB — BASIC METABOLIC PANEL
Anion gap: 9 (ref 5–15)
BUN: 44 mg/dL — AB (ref 6–20)
CALCIUM: 9.3 mg/dL (ref 8.9–10.3)
CHLORIDE: 98 mmol/L — AB (ref 101–111)
CO2: 25 mmol/L (ref 22–32)
CREATININE: 1.21 mg/dL — AB (ref 0.44–1.00)
GFR calc non Af Amer: 41 mL/min — ABNORMAL LOW (ref >60)
GFR, EST AFRICAN AMERICAN: 47 mL/min — AB (ref >60)
Glucose, Bld: 159 mg/dL — ABNORMAL HIGH (ref 65–99)
Potassium: 4.9 mmol/L (ref 3.5–5.1)
SODIUM: 132 mmol/L — AB (ref 135–145)

## 2015-09-06 LAB — CBC
HEMATOCRIT: 41 % (ref 36.0–46.0)
HEMOGLOBIN: 13.4 g/dL (ref 12.0–15.0)
MCH: 27.8 pg (ref 26.0–34.0)
MCHC: 32.7 g/dL (ref 30.0–36.0)
MCV: 85.1 fL (ref 78.0–100.0)
Platelets: 381 10*3/uL (ref 150–400)
RBC: 4.82 MIL/uL (ref 3.87–5.11)
RDW: 14.3 % (ref 11.5–15.5)
WBC: 24.7 10*3/uL — AB (ref 4.0–10.5)

## 2015-09-06 LAB — GLUCOSE, CAPILLARY
GLUCOSE-CAPILLARY: 243 mg/dL — AB (ref 65–99)
Glucose-Capillary: 174 mg/dL — ABNORMAL HIGH (ref 65–99)
Glucose-Capillary: 119 mg/dL — ABNORMAL HIGH (ref 65–99)
Glucose-Capillary: 249 mg/dL — ABNORMAL HIGH (ref 65–99)

## 2015-09-06 MED ORDER — METHYLPREDNISOLONE SODIUM SUCC 125 MG IJ SOLR
60.0000 mg | Freq: Two times a day (BID) | INTRAMUSCULAR | Status: DC
  Administered 2015-09-06 – 2015-09-07 (×2): 60 mg via INTRAVENOUS
  Filled 2015-09-06 (×2): qty 2

## 2015-09-06 MED ORDER — DOCUSATE SODIUM 100 MG PO CAPS
100.0000 mg | ORAL_CAPSULE | Freq: Two times a day (BID) | ORAL | Status: DC
  Administered 2015-09-06 – 2015-09-07 (×3): 100 mg via ORAL
  Filled 2015-09-06 (×5): qty 1

## 2015-09-06 MED ORDER — IPRATROPIUM-ALBUTEROL 0.5-2.5 (3) MG/3ML IN SOLN
3.0000 mL | Freq: Four times a day (QID) | RESPIRATORY_TRACT | Status: DC
  Administered 2015-09-07 (×4): 3 mL via RESPIRATORY_TRACT
  Filled 2015-09-06 (×4): qty 3

## 2015-09-06 MED ORDER — BISACODYL 5 MG PO TBEC
10.0000 mg | DELAYED_RELEASE_TABLET | Freq: Once | ORAL | Status: AC
  Administered 2015-09-06: 10 mg via ORAL
  Filled 2015-09-06: qty 2

## 2015-09-06 NOTE — Progress Notes (Signed)
Physical Therapy Treatment Patient Details Name: [name] MRN: [id number] DOB: 09-Apr-1932 Today's Date: 09/06/2015    History of Present Illness [name] is a 82 y.o. female with medical history significant of CKD , HTN spinal stenosis, GERD, HL, Asthma who presented with dyspnea, wheezing and hypoxia.    PT Comments    Patient is making gradual progress toward mobility goals. Pt SOB and fatigued upon PT arrival from going to bathroom without O2 donned and SpO2 75% on RA upon PT arrival. Daughter present. SpO2 remained 88-92% throughout session with 3L O2 via nasal canula and cues for breathing technique.   Follow Up Recommendations  Home health PT;Supervision/Assistance - 24 hour     Equipment Recommendations  Other (comment) (4 wheeled walker)    Recommendations for Other Services       Precautions / Restrictions Precautions Precautions: Fall Restrictions Weight Bearing Restrictions: No    Mobility  Bed Mobility Overal bed mobility: Needs Assistance Bed Mobility: Supine to Sit     Supine to sit: Supervision     General bed mobility comments: increased time; supervision for safety  Transfers Overall transfer level: Needs assistance Equipment used: Rolling walker (2 wheeled) Transfers: Sit to/from Stand Sit to Stand: Min guard         General transfer comment: cues for hand placement  Ambulation/Gait Ambulation/Gait assistance: Min guard Ambulation Distance (Feet): 150 Feet Assistive device: Rolling walker (2 wheeled) Gait Pattern/deviations: Step-through pattern;Decreased stride length     General Gait Details: slow, steady gait; cues for posture and position of RW; SpO2 88-91% while ambulating with 3L O2 via nasal canula; educated on pursed lip breathing and self monitoring for need of breaks   Stairs            Wheelchair Mobility    Modified Rankin (Stroke Patients Only)       Balance                                     Cognition Arousal/Alertness: Awake/alert Behavior During Therapy: WFL for tasks assessed/performed Overall Cognitive Status: Within Functional Limits for tasks assessed                      Exercises      General Comments General comments (skin integrity, edema, etc.): daughter present; discussed sitting up in chair to prevent PNA and to help clear lungs; upon entering room pt up with daughter returning to bed from bathroom with O2 off and SpO2 75% with pt SOB; donned 3L O2 and instructed pt to perform breathing technique; SpO2 elevated to 91% before ambulating      Pertinent Vitals/Pain Pain Assessment: No/denies pain    Home Living                      Prior Function            PT Goals (current goals can now be found in the care plan section) Acute Rehab PT Goals Patient Stated Goal: feel better and go home Progress towards PT goals: Progressing toward goals    Frequency  Min 3X/week    PT Plan Current plan remains appropriate    Co-evaluation             End of Session Equipment Utilized During Treatment: Gait belt Activity Tolerance: Patient tolerated treatment well Patient left: in chair;with call bell/phone  within reach;with family/visitor present     Time: [id number] PT Time Calculation (min) (ACUTE ONLY): 31 min  Charges:  $Gait Training: 8-22 mins $Therapeutic Activity: 8-22 mins                    G Codes:      Salina April, PTA Pager: [phone]   09/06/2015, 10:32 AM

## 2015-09-06 NOTE — Care Management Important Message (Signed)
Important Message  Patient Details  Name: [name] MRN: [id number] Date of Birth: [birth year]   Medicare Important Message Given:  Yes    [name] 09/06/2015, 10:53 AM

## 2015-09-06 NOTE — Progress Notes (Addendum)
Patient ID: [name], female   DOB: [birth year], 82 y.o.   MRN: [id number]  PROGRESS NOTE    [name]  [id number] DOB: 28-Nov-1932 DOA: 09/03/2015  PCP: Odette Fraction, MD   Brief Narrative:  82 y.o. female with past medical history significant for hypertension, spinal stenosis, dyslipidemia, asthma. Patient presented to [facility] with worsening shortness of breath over past 3 days prior to this admission. She used at home inhalers with no significant symptomatic relief.  She initially presented to urgent care and found to be hypoxic around 80s and she was referred to emergency department for further evaluation. She was admitted for management of asthma exacerbation.  Assessment & Plan:  Acute respiratory failure with hypoxia /Severe Acute Asthma exacerbation  - Stable respiratory status - Continue duoneb every 4 hours scheduled - Continue duoneb every 4 hours as needed for shortness of breath or wheezing  - Continue steroids, solumedrol 60 IV but reduce to Q 12 hours instead of Q 6 hours   Accelerated hypertension - Likely exacerbated by steroids  - Still slightly hypertensive this am but not yet given BP meds - Continue Norvasc and labetalol   Leukocytosis - Likely secondary to steroids - Patient not on any antibiotics, no current evidence of infectious process - Check CBC in am  Chronic kidney disease stage III - Baseline creatinine 1.4 about 10 months ago - Creatinine on this admission 1.09 and up to 1.4 - Will check BMP this am   Hypokalemia - Likely secondary to nebulizer treatments - Potassium now within normal limits  Depression - Continue Celexa  Dyslipidemia - Continue statin therapy  Steroid induced hyperglycemia - A1c is 5.6 - Continue sliding scale insulin - CBG's in past 24 hours: 207, 148, 167  Recent falls - PT evaluation - HHPT, orders placed    DVT prophylaxis: Lovenox subQ ordered  Code Status: full code  Family  Communication: called daughter Joelene Millin over the phone, gave update  Disposition Plan: home likely by 6/21   Consultants:   Physical therapy   Pulmonary  Procedures:   None  Antimicrobials:   None   Subjective: No respiratory distress.   Objective: Filed Vitals:   09/05/15 2144 09/05/15 2356 09/06/15 0419 09/06/15 0529  BP: 179/74   168/81  Pulse: 94   96  Temp: 97.4 F (36.3 C)   97.9 F (36.6 C)  TempSrc: Oral   Oral  Resp: 18   18  Height:      Weight:      SpO2: 94% 95% 94% 95%    Intake/Output Summary (Last 24 hours) at 09/06/15 0643 Last data filed at 09/06/15 0530  Gross per 24 hour  Intake      3 ml  Output    600 ml  Net   -597 ml   Filed Weights   09/03/15 1806 09/04/15 1545 09/05/15 1309  Weight: 72.576 kg (160 lb) 73.9 kg (162 lb 14.7 oz) 76.9 kg (169 lb 8.5 oz)    Examination:  General exam: No acute distress   Respiratory system: Wheezing in upper lung lobes but improved since yesterday, no rhonchi  Cardiovascular system: S1 & S2 heard, rate controlled  Gastrointestinal system: (+) BS, non tender  Central nervous system: No focal neurological deficits. Extremities: No swelling, palpable pulses  Skin: warm and dry Psychiatry: Normal mood and behavior   Data Reviewed: I have personally reviewed following labs and imaging studies  CBC:  Recent Labs Lab 09/03/15 1815  09/04/15 0400  WBC 13.8* 7.2  NEUTROABS 10.4*  --   HGB 14.6 14.5  HCT 43.7 43.9  MCV 82.8 82.1  PLT 351 [id number]   Basic Metabolic Panel:  Recent Labs Lab 09/03/15 1815 09/04/15 0400 09/05/15 0732  NA 134* 134* 132*  K 3.0* 3.1* 4.1  CL 98* 97* 99*  CO2 25 23 21*  GLUCOSE 200* 212* 170*  BUN 13 17 33*  CREATININE 1.09* 1.30* 1.42*  CALCIUM 9.5 9.7 9.6  MG  --  2.7* 2.4  PHOS  --  3.0  --    GFR: Estimated Creatinine Clearance: 32.5 mL/min (by C-G formula based on Cr of 1.42). Liver Function Tests:  Recent Labs Lab 09/03/15 1815 09/04/15 0400  09/05/15 0732  AST 29 28 43*  ALT 41 39 46  ALKPHOS 130* 119 104  BILITOT 0.6 0.4 0.4  PROT 7.4 7.2 7.0  ALBUMIN 4.2 3.9 3.8   No results for input(s): LIPASE, AMYLASE in the last 168 hours. No results for input(s): AMMONIA in the last 168 hours. Coagulation Profile: No results for input(s): INR, PROTIME in the last 168 hours. Cardiac Enzymes: No results for input(s): CKTOTAL, CKMB, CKMBINDEX, TROPONINI in the last 168 hours. BNP (last 3 results) No results for input(s): PROBNP in the last 8760 hours. HbA1C:  Recent Labs  09/04/15 0400  HGBA1C 5.6   CBG:  Recent Labs Lab 09/04/15 2101 09/05/15 0739 09/05/15 1137 09/05/15 1653 09/05/15 2100  GLUCAP 169* 170* 207* 148* 167*   Lipid Profile: No results for input(s): CHOL, HDL, LDLCALC, TRIG, CHOLHDL, LDLDIRECT in the last 72 hours. Thyroid Function Tests:  Recent Labs  09/04/15 0400  TSH 1.151   Anemia Panel: No results for input(s): VITAMINB12, FOLATE, FERRITIN, TIBC, IRON, RETICCTPCT in the last 72 hours. Urine analysis:    Component Value Date/Time   COLORURINE YELLOW 09/02/2007 1301   APPEARANCEUR CLEAR 09/02/2007 1301   LABSPEC 1.015 09/02/2007 1301   PHURINE 5.5 09/02/2007 1301   GLUCOSEU NEGATIVE 09/02/2007 1301   HGBUR NEGATIVE 09/02/2007 1301   BILIRUBINUR NEGATIVE 09/02/2007 1301   KETONESUR NEGATIVE 09/02/2007 1301   PROTEINUR NEGATIVE 09/02/2007 1301   UROBILINOGEN 0.2 09/02/2007 1301   NITRITE NEGATIVE 09/02/2007 1301   LEUKOCYTESUR  09/02/2007 1301    NEGATIVE MICROSCOPIC NOT DONE ON URINES WITH NEGATIVE PROTEIN, BLOOD, LEUKOCYTES, NITRITE, OR GLUCOSE <1000 mg/dL.   Sepsis Labs: @LABRCNTIP (procalcitonin:4,lacticidven:4)   Recent Results (from the past 240 hour(s))  MRSA PCR Screening     Status: None   Collection Time: 09/04/15  5:35 PM  Result Value Ref Range Status   MRSA by PCR NEGATIVE NEGATIVE Final      Radiology Studies: Ct Angio Chest Pe W Or Wo Contrast 09/03/2015   No  evidence of pulmonary embolism. No acute cardiopulmonary disease. Minimal atherosclerotic coronary artery disease.   Dg Chest Port 1 View 09/03/2015  Bilateral mid and lower lung heterogeneous opacities favored to represent atelectasis. Infection not excluded.   Marland Kitchen amLODipine  10 mg Oral Daily  . atorvastatin  20 mg Oral Daily  . citalopram  20 mg Oral Daily  . docusate sodium  100 mg Oral BID  . enoxaparin (LOVENOX) injection  40 mg Subcutaneous Q24H  . gabapentin  300 mg Oral TID  . guaiFENesin  600 mg Oral BID  . insulin aspart  0-15 Units Subcutaneous TID WC  . ipratropium-albuterol  3 mL Nebulization Q4H  . labetalol  100 mg Oral BID  . loratadine  10  mg Oral Daily  . methylPREDNISolone (SOLU-MEDROL) injection  60 mg Intravenous Q12H  . montelukast  10 mg Oral QHS  . sodium chloride flush  3 mL Intravenous Q12H     Continuous Infusions:    LOS: 3 days    Time spent: 25 minutes  Greater than 50% of the time spent on counseling and coordinating the care.   Leisa Lenz, MD [facility] Pager [phone]  If 7PM-7AM, please contact night-coverage [email or web address] Password [facility] 09/06/2015, 6:43 AM

## 2015-09-07 DIAGNOSIS — I10 Essential (primary) hypertension: Secondary | ICD-10-CM

## 2015-09-07 LAB — BASIC METABOLIC PANEL
Anion gap: 10 (ref 5–15)
BUN: 38 mg/dL — AB (ref 6–20)
CALCIUM: 9.1 mg/dL (ref 8.9–10.3)
CO2: 24 mmol/L (ref 22–32)
CREATININE: 0.98 mg/dL (ref 0.44–1.00)
Chloride: 96 mmol/L — ABNORMAL LOW (ref 101–111)
GFR calc Af Amer: >60 mL/min (ref >60)
GFR, EST NON AFRICAN AMERICAN: 53 mL/min — AB (ref >60)
GLUCOSE: 152 mg/dL — AB (ref 65–99)
Potassium: 4.8 mmol/L (ref 3.5–5.1)
Sodium: 130 mmol/L — ABNORMAL LOW (ref 135–145)

## 2015-09-07 LAB — CBC
HCT: 44.1 % (ref 36.0–46.0)
Hemoglobin: 14.5 g/dL (ref 12.0–15.0)
MCH: 27.6 pg (ref 26.0–34.0)
MCHC: 32.9 g/dL (ref 30.0–36.0)
MCV: 84 fL (ref 78.0–100.0)
PLATELETS: 390 10*3/uL (ref 150–400)
RBC: 5.25 MIL/uL — AB (ref 3.87–5.11)
RDW: 14 % (ref 11.5–15.5)
WBC: 23.8 10*3/uL — AB (ref 4.0–10.5)

## 2015-09-07 LAB — GLUCOSE, CAPILLARY
GLUCOSE-CAPILLARY: 166 mg/dL — AB (ref 65–99)
Glucose-Capillary: 156 mg/dL — ABNORMAL HIGH (ref 65–99)
Glucose-Capillary: 139 mg/dL — ABNORMAL HIGH (ref 65–99)
Glucose-Capillary: 228 mg/dL — ABNORMAL HIGH (ref 65–99)

## 2015-09-07 MED ORDER — PREDNISONE 50 MG PO TABS
50.0000 mg | ORAL_TABLET | Freq: Every day | ORAL | Status: DC
  Administered 2015-09-07: 50 mg via ORAL
  Filled 2015-09-07: qty 1

## 2015-09-07 MED ORDER — ONDANSETRON HCL 4 MG/2ML IJ SOLN
4.0000 mg | Freq: Once | INTRAMUSCULAR | Status: AC
  Administered 2015-09-07: 4 mg via INTRAVENOUS
  Filled 2015-09-07: qty 2

## 2015-09-07 MED ORDER — IPRATROPIUM-ALBUTEROL 0.5-2.5 (3) MG/3ML IN SOLN
3.0000 mL | Freq: Four times a day (QID) | RESPIRATORY_TRACT | Status: DC
  Administered 2015-09-08: 3 mL via RESPIRATORY_TRACT
  Filled 2015-09-07: qty 3

## 2015-09-07 MED ORDER — HYDRALAZINE HCL 25 MG PO TABS: 25.0000 mg | ORAL_TABLET | Freq: Four times a day (QID) | ORAL | Status: DC

## 2015-09-07 MED ORDER — METOPROLOL TARTRATE 50 MG PO TABS
50.0000 mg | ORAL_TABLET | Freq: Two times a day (BID) | ORAL | Status: DC
  Administered 2015-09-07 – 2015-09-08 (×3): 50 mg via ORAL
  Filled 2015-09-07 (×3): qty 1

## 2015-09-07 NOTE — Progress Notes (Signed)
Patient ID: [name], female   DOB: 10/09/1932, 82 y.o.   MRN: [id number]  PROGRESS NOTE    [name]  [id number] DOB: Apr 04, 1932 DOA: 09/03/2015  PCP: Odette Fraction, MD   Brief Narrative:  82 y.o. female with past medical history significant for hypertension, spinal stenosis, dyslipidemia, asthma. Patient presented to [facility] with worsening shortness of breath over past 3 days prior to this admission. She used at home inhalers with no significant symptomatic relief.  She initially presented to urgent care and found to be hypoxic around 80s and she was referred to emergency department for further evaluation. She was admitted for management of asthma exacerbation.  Assessment & Plan:  Acute respiratory failure with hypoxia /Severe Acute Asthma exacerbation  - Stable but still coarse breath sounds and mild wheezing - Oxygen saturation is 95% with [place] oxygen support  - Continue duoneb every 4 hours scheduled and every 4 hours as needed for shortness of breath or wheezing  - Continue steroids but stop solumedrol and use prednisone 50 mg daily   Accelerated hypertension - Likely exacerbated by steroids  - BP 169/69 this am  - Continue Norvasc  - Change labetalol to metoprolol   Leukocytosis / SIRS - Likely secondary to steroids - SIRS criteria with leukocytosis and tachycardia but pt is not septic   - Patient not on any antibiotics, no current evidence of infectious process  Chronic kidney disease stage III - Baseline creatinine 1.4 about 10 months ago - Creatinine on this admission 1.09 and up to 1.4 but since then has improved and is now WNL  Hypokalemia - Likely secondary to nebulizer treatments - Potassium subsequently within normal limits  Depression - Continue Celexa - Stable, not depressed   Dyslipidemia - Continue Lipitor 20 mg daily   Steroid induced hyperglycemia - A1c is 5.6 - Continue sliding scale insulin - CBG's in past 24  hours: 119, 249, 156  Recent falls - PT evaluation - HHPT, orders placed    DVT prophylaxis: Lovenox subQ ordered  Code Status: full code  Family Communication: called daughter Joelene Millin over the phone, gave update  Disposition Plan: home likely by 6/22 if respiratory status better.    Consultants:   Physical therapy   Pulmonary  Procedures:   None  Antimicrobials:   None   Subjective: Coughing but no distress.   Objective: Filed Vitals:   09/06/15 2144 09/06/15 2321 09/07/15 0143 09/07/15 0516  BP: 179/76 159/68  177/85  Pulse: 98 84  88  Temp:    98.4 F (36.9 C)  TempSrc:      Resp:    18  Height:      Weight:      SpO2:  95% 95%     Intake/Output Summary (Last 24 hours) at 09/07/15 0739 Last data filed at 09/07/15 0409  Gross per 24 hour  Intake    445 ml  Output   1250 ml  Net   -805 ml   Filed Weights   09/03/15 1806 09/04/15 1545 09/05/15 1309  Weight: 72.576 kg (160 lb) 73.9 kg (162 lb 14.7 oz) 76.9 kg (169 lb 8.5 oz)    Examination:  General exam: Calm and comfortable  Respiratory system: Wheezing in upper lung lobes with coarse breath sounds  Cardiovascular system: S1 & S2 (+), RRR Gastrointestinal system: (+) BS, non distended abdomen  Central nervous system: Nonfocal  Extremities: No edema, palpable pulses  Skin: No lesions or ulcers  Psychiatry: No  agitation or restlessness   Data Reviewed: I have personally reviewed following labs and imaging studies  CBC:  Recent Labs Lab 09/03/15 1815 09/04/15 0400 09/06/15 0741 09/07/15 0556  WBC 13.8* 7.2 24.7* 23.8*  NEUTROABS 10.4*  --   --   --   HGB 14.6 14.5 13.4 14.5  HCT 43.7 43.9 41.0 44.1  MCV 82.8 82.1 85.1 84.0  PLT 351 352 381 [id number]   Basic Metabolic Panel:  Recent Labs Lab 09/03/15 1815 09/04/15 0400 09/05/15 0732 09/06/15 0741 09/07/15 0556  NA 134* 134* 132* 132* 130*  K 3.0* 3.1* 4.1 4.9 4.8  CL 98* 97* 99* 98* 96*  CO2 25 23 21* 25 24  GLUCOSE 200* 212*  170* 159* 152*  BUN 13 17 33* 44* 38*  CREATININE 1.09* 1.30* 1.42* 1.21* 0.98  CALCIUM 9.5 9.7 9.6 9.3 9.1  MG  --  2.7* 2.4  --   --   PHOS  --  3.0  --   --   --    GFR: Estimated Creatinine Clearance: 47.1 mL/min (by C-G formula based on Cr of 0.98). Liver Function Tests:  Recent Labs Lab 09/03/15 1815 09/04/15 0400 09/05/15 0732  AST 29 28 43*  ALT 41 39 46  ALKPHOS 130* 119 104  BILITOT 0.6 0.4 0.4  PROT 7.4 7.2 7.0  ALBUMIN 4.2 3.9 3.8   No results for input(s): LIPASE, AMYLASE in the last 168 hours. No results for input(s): AMMONIA in the last 168 hours. Coagulation Profile: No results for input(s): INR, PROTIME in the last 168 hours. Cardiac Enzymes: No results for input(s): CKTOTAL, CKMB, CKMBINDEX, TROPONINI in the last 168 hours. BNP (last 3 results) No results for input(s): PROBNP in the last 8760 hours. HbA1C: No results for input(s): HGBA1C in the last 72 hours. CBG:  Recent Labs Lab 09/05/15 2100 09/06/15 0741 09/06/15 1141 09/06/15 1715 09/06/15 2100  GLUCAP 167* 174* 243* 119* 249*   Lipid Profile: No results for input(s): CHOL, HDL, LDLCALC, TRIG, CHOLHDL, LDLDIRECT in the last 72 hours. Thyroid Function Tests: No results for input(s): TSH, T4TOTAL, FREET4, T3FREE, THYROIDAB in the last 72 hours. Anemia Panel: No results for input(s): VITAMINB12, FOLATE, FERRITIN, TIBC, IRON, RETICCTPCT in the last 72 hours. Urine analysis:    Component Value Date/Time   COLORURINE YELLOW 09/02/2007 1301   APPEARANCEUR CLEAR 09/02/2007 1301   LABSPEC 1.015 09/02/2007 1301   PHURINE 5.5 09/02/2007 1301   GLUCOSEU NEGATIVE 09/02/2007 1301   HGBUR NEGATIVE 09/02/2007 1301   BILIRUBINUR NEGATIVE 09/02/2007 1301   KETONESUR NEGATIVE 09/02/2007 1301   PROTEINUR NEGATIVE 09/02/2007 1301   UROBILINOGEN 0.2 09/02/2007 1301   NITRITE NEGATIVE 09/02/2007 1301   LEUKOCYTESUR  09/02/2007 1301    NEGATIVE MICROSCOPIC NOT DONE ON URINES WITH NEGATIVE PROTEIN,  BLOOD, LEUKOCYTES, NITRITE, OR GLUCOSE <1000 mg/dL.   Sepsis Labs: @LABRCNTIP (procalcitonin:4,lacticidven:4)   Recent Results (from the past 240 hour(s))  MRSA PCR Screening     Status: None   Collection Time: 09/04/15  5:35 PM  Result Value Ref Range Status   MRSA by PCR NEGATIVE NEGATIVE Final      Radiology Studies: Ct Angio Chest Pe W Or Wo Contrast 09/03/2015   No evidence of pulmonary embolism. No acute cardiopulmonary disease. Minimal atherosclerotic coronary artery disease.   Dg Chest Port 1 View 09/03/2015  Bilateral mid and lower lung heterogeneous opacities favored to represent atelectasis. Infection not excluded.   Marland Kitchen amLODipine  10 mg Oral Daily  . atorvastatin  20 mg Oral Daily  . citalopram  20 mg Oral Daily  . docusate sodium  100 mg Oral BID  . enoxaparin (LOVENOX) injection  40 mg Subcutaneous Q24H  . gabapentin  300 mg Oral TID  . guaiFENesin  600 mg Oral BID  . insulin aspart  0-15 Units Subcutaneous TID WC  . ipratropium-albuterol  3 mL Nebulization Q6H  . labetalol  100 mg Oral BID  . loratadine  10 mg Oral Daily  . montelukast  10 mg Oral QHS  . predniSONE  50 mg Oral Q breakfast  . sodium chloride flush  3 mL Intravenous Q12H     Continuous Infusions:    LOS: 4 days    Time spent: 25 minutes  Greater than 50% of the time spent on counseling and coordinating the care.   Leisa Lenz, MD [facility] Pager [phone]  If 7PM-7AM, please contact night-coverage [email or web address] Password [facility] 09/07/2015, 7:39 AM

## 2015-09-07 NOTE — Progress Notes (Addendum)
Pt c/o nausea this am. Zofran given once. Pt still c/o nausea. Paged Dr Charlies Silvers. Waiting to hear back from dr.  Tawanna Solo Dr Charlies Silvers called nurse to order X1 dose Zofran IV 4mg . Will administer as soon as available.

## 2015-09-07 NOTE — Progress Notes (Signed)
O2 at 3L this am. Weaned O2 to 2L. PT working with pt on RA. Pt dropped to 86-88%. Pt remaining on 2L for now. Will re-assess.

## 2015-09-07 NOTE — Progress Notes (Signed)
Physical Therapy Treatment Patient Details Name: [name] MRN: [id number] DOB: Feb 07, 1933 Today's Date: 09/07/2015    History of Present Illness Sianne P Cape is a 82 y.o. female with medical history significant of CKD , HTN spinal stenosis, GERD, HL, Asthma who presented with dyspnea, wheezing and hypoxia.    PT Comments    Patient limited by nausea this session. SpO2 86-88% on RA with OOB mobility and increased to 92% with 2L supplemental O2 end of session. Continue to progress as tolerated with anticipated d/c home with HHPT.   Follow Up Recommendations  Home health PT;Supervision/Assistance - 24 hour     Equipment Recommendations  Other (comment) (4 wheeled walker)    Recommendations for Other Services       Precautions / Restrictions Precautions Precautions: Fall Restrictions Weight Bearing Restrictions: No    Mobility  Bed Mobility Overal bed mobility: Modified Independent Bed Mobility: Supine to Sit     Supine to sit: Modified independent (Device/Increase time)     General bed mobility comments: HOB elevated and increased time  Transfers Overall transfer level: Needs assistance Equipment used: Rolling walker (2 wheeled) Transfers: Sit to/from Stand Sit to Stand: Min guard         General transfer comment: cues for hand placement  Ambulation/Gait Ambulation/Gait assistance: Min guard Ambulation Distance (Feet): 24 Feet (12,12) Assistive device: Rolling walker (2 wheeled) Gait Pattern/deviations: Step-through pattern;Trunk flexed;Decreased stride length     General Gait Details: cues for position of RW and posture; pt ambulated in room to [facility] and to recliner; limited by nausea; RN aware; performed OOB mobility on RA with SpO2 decreased to 86% and elevated to 92% on 2L O2 end of session   Stairs            Wheelchair Mobility    Modified Rankin (Stroke Patients Only)       Balance     Sitting balance-Leahy Scale: Good        Standing balance-Leahy Scale: Fair                      Cognition Arousal/Alertness: Awake/alert Behavior During Therapy: WFL for tasks assessed/performed Overall Cognitive Status: Within Functional Limits for tasks assessed                      Exercises      General Comments        Pertinent Vitals/Pain Pain Assessment: No/denies pain    Home Living                      Prior Function            PT Goals (current goals can now be found in the care plan section) Acute Rehab PT Goals Patient Stated Goal: feel better Progress towards PT goals: Progressing toward goals    Frequency  Min 3X/week    PT Plan Current plan remains appropriate    Co-evaluation             End of Session Equipment Utilized During Treatment: Gait belt Activity Tolerance: Other (comment) (limited by nausea) Patient left: in chair;with call bell/phone within reach;with family/visitor present     Time: 1140-1205 PT Time Calculation (min) (ACUTE ONLY): 25 min  Charges:  $Gait Training: 8-22 mins $Therapeutic Activity: 8-22 mins                    G Codes:  Salina April, PTA Pager: [phone]   09/07/2015, 1:24 PM

## 2015-09-08 ENCOUNTER — Inpatient Hospital Stay (HOSPITAL_COMMUNITY): Payer: Medicare Other

## 2015-09-08 LAB — HEMOGLOBIN AND HEMATOCRIT, BLOOD
HCT: 32.3 % — ABNORMAL LOW (ref 36.0–46.0)
HEMATOCRIT: 30.5 % — AB (ref 36.0–46.0)
Hemoglobin: 10.6 g/dL — ABNORMAL LOW (ref 12.0–15.0)
Hemoglobin: 10.1 g/dL — ABNORMAL LOW (ref 12.0–15.0)

## 2015-09-08 LAB — BASIC METABOLIC PANEL
Anion gap: 7 (ref 5–15)
BUN: 71 mg/dL — ABNORMAL HIGH (ref 6–20)
CO2: 25 mmol/L (ref 22–32)
Calcium: 8.5 mg/dL — ABNORMAL LOW (ref 8.9–10.3)
Chloride: 96 mmol/L — ABNORMAL LOW (ref 101–111)
Creatinine, Ser: 1.13 mg/dL — ABNORMAL HIGH (ref 0.44–1.00)
GFR calc Af Amer: 51 mL/min — ABNORMAL LOW (ref >60)
GFR calc non Af Amer: 44 mL/min — ABNORMAL LOW (ref >60)
Glucose, Bld: 130 mg/dL — ABNORMAL HIGH (ref 65–99)
Potassium: 4.7 mmol/L (ref 3.5–5.1)
Sodium: 128 mmol/L — ABNORMAL LOW (ref 135–145)

## 2015-09-08 LAB — BLOOD GAS, ARTERIAL
Acid-Base Excess: 0.5 mmol/L (ref 0.0–2.0)
BICARBONATE: 24.6 meq/L — AB (ref 20.0–24.0)
DRAWN BY: 103701
O2 Content: 2 L/min
O2 SAT: 93.9 %
Patient temperature: 98.6
TCO2: 25.8 mmol/L (ref 0–100)
pCO2 arterial: 39.9 mmHg (ref 35.0–45.0)
pH, Arterial: 7.407 (ref 7.350–7.450)
pO2, Arterial: 74.3 mmHg — ABNORMAL LOW (ref 80.0–100.0)

## 2015-09-08 LAB — OCCULT BLOOD X 1 CARD TO LAB, STOOL: FECAL OCCULT BLD: POSITIVE — AB

## 2015-09-08 LAB — CBC
HCT: 33.1 % — ABNORMAL LOW (ref 36.0–46.0)
Hemoglobin: 10.7 g/dL — ABNORMAL LOW (ref 12.0–15.0)
MCH: 27 pg (ref 26.0–34.0)
MCHC: 32.3 g/dL (ref 30.0–36.0)
MCV: 83.4 fL (ref 78.0–100.0)
Platelets: 441 10*3/uL — ABNORMAL HIGH (ref 150–400)
RBC: 3.97 MIL/uL (ref 3.87–5.11)
RDW: 13.8 % (ref 11.5–15.5)
WBC: 33.8 10*3/uL — ABNORMAL HIGH (ref 4.0–10.5)

## 2015-09-08 LAB — GLUCOSE, CAPILLARY
GLUCOSE-CAPILLARY: 128 mg/dL — AB (ref 65–99)
Glucose-Capillary: 140 mg/dL — ABNORMAL HIGH (ref 65–99)
Glucose-Capillary: 127 mg/dL — ABNORMAL HIGH (ref 65–99)
Glucose-Capillary: 132 mg/dL — ABNORMAL HIGH (ref 65–99)

## 2015-09-08 MED ORDER — ALPRAZOLAM 0.5 MG PO TABS
1.0000 mg | ORAL_TABLET | Freq: Once | ORAL | Status: AC
  Administered 2015-09-08: 1 mg via ORAL
  Filled 2015-09-08: qty 2

## 2015-09-08 MED ORDER — IPRATROPIUM-ALBUTEROL 0.5-2.5 (3) MG/3ML IN SOLN
3.0000 mL | Freq: Three times a day (TID) | RESPIRATORY_TRACT | Status: DC
  Administered 2015-09-08 – 2015-09-13 (×14): 3 mL via RESPIRATORY_TRACT
  Filled 2015-09-08 (×14): qty 3

## 2015-09-08 MED ORDER — METOPROLOL TARTRATE 25 MG PO TABS
25.0000 mg | ORAL_TABLET | Freq: Two times a day (BID) | ORAL | Status: DC
Start: 2015-09-08 — End: 2015-09-16
  Administered 2015-09-08 – 2015-09-16 (×15): 25 mg via ORAL
  Filled 2015-09-08 (×12): qty 1
  Filled 2015-09-08: qty 2
  Filled 2015-09-08 (×2): qty 1

## 2015-09-08 NOTE — Progress Notes (Signed)
Pt. Had five tarry stools over night per night shift report. MD notified this morning with rounding of stools and increased confusion. Pt. Continued to have four large tarry stools throughout morning. MD notified again about stool amount and description. Will continue to monitor and await orders from MD.

## 2015-09-08 NOTE — Progress Notes (Signed)
Patient ID: [name], female   DOB: [birth year], 82 y.o.   MRN: [id number]  PROGRESS NOTE    [name]  [id number] DOB: Jan 18, 1933 DOA: 09/03/2015  PCP: Odette Fraction, MD   Brief Narrative:  82 y.o. female with past medical history significant for hypertension, spinal stenosis, dyslipidemia, asthma. Patient presented to [facility] with worsening shortness of breath over past 3 days prior to this admission. She used at home inhalers with no significant symptomatic relief.  She initially presented to urgent care and found to be hypoxic around 80s and she was referred to emergency department for further evaluation. She was admitted for management of asthma exacerbation.  Assessment & Plan:  Acute respiratory failure with hypoxia /Severe Acute Asthma exacerbation  - Oxygen saturation 98% with nasal cannula oxygen support. - She feels little anxious this morning so we'll repeat chest x-ray - Stopped steroids today - Continue nebulizer treatments - We'll give 1 dose of Xanax 1 mg to see if she improves.  Accelerated hypertension - Likely exacerbated by steroids  - Continue Norvasc  - Continue metoprolol, dose changed to 25 mg twice daily this morning. Her blood pressure is 148/88  Leukocytosis / SIRS - Likely secondary to steroids - SIRS criteria with leukocytosis and tachycardia but pt is not septic   - Stopped steroids today, she did not get them today   Chronic kidney disease stage III - Baseline creatinine 1.4 about 10 months ago - Creatinine on this admission 1.09 and up to 1.4 but improving and better this am 1.13  Hypokalemia - Likely secondary to nebulizer treatments - Potassium subsequently within normal limits  Depression - Continue Celexa  Dyslipidemia - Continue Lipitor 20 mg daily   Steroid induced hyperglycemia - A1c is 5.6 - Continue sliding scale insulin - CBG's in past 24 hours: 139, 228, 140  Recent falls - PT evaluation -  HHPT, orders placed    DVT prophylaxis: Lovenox subQ ordered  Code Status: full code  Family Communication: called daughter Joelene Millin over the phone, gave update  Disposition Plan: Not stable for discharge at this time. Feels little short of breath this morning. We'll plan for chest x-ray today, monitor for 24 hours and if she feels better by tomorrow hopefully discharge 09/09/2015   Consultants:   Physical therapy   Pulmonary  Procedures:   None  Antimicrobials:   None   Subjective: Feels little anxious this morning. Says she is little short of breath.  Objective: Filed Vitals:   09/08/15 0250 09/08/15 0605 09/08/15 0727 09/08/15 0837  BP: 140/73 156/78  148/88  Pulse: 83 79  86  Temp: 97.5 F (36.4 C) 98.7 F (37.1 C)    TempSrc: Oral     Resp: 20 20    Height:      Weight:      SpO2: 99% 98% 98%     Intake/Output Summary (Last 24 hours) at 09/08/15 1046 Last data filed at 09/08/15 [id number]  Gross per 24 hour  Intake   1680 ml  Output      0 ml  Net   1680 ml   Filed Weights   09/03/15 1806 09/04/15 1545 09/05/15 1309  Weight: 72.576 kg (160 lb) 73.9 kg (162 lb 14.7 oz) 76.9 kg (169 lb 8.5 oz)    Examination:  General exam: Says she feels little anxious Respiratory system: Coarse breath sounds bilaterally, mild wheezing Cardiovascular system: S1 & S2 (+), heart rate controlled  Gastrointestinal system: (+)  BS, non tender  Central nervous system: No focal deficits  Extremities: No swelling, palpable pulses  Skin: Warm and dry  Psychiatry: Normal behavior   Data Reviewed: I have personally reviewed following labs and imaging studies  CBC:  Recent Labs Lab 09/03/15 1815 09/04/15 0400 09/06/15 0741 09/07/15 0556 09/08/15 0036 09/08/15 0533  WBC 13.8* 7.2 24.7* 23.8*  --  33.8*  NEUTROABS 10.4*  --   --   --   --   --   HGB 14.6 14.5 13.4 14.5 10.6* 10.7*  HCT 43.7 43.9 41.0 44.1 32.3* 33.1*  MCV 82.8 82.1 85.1 84.0  --  83.4  PLT 351 352 381  390  --  [id number]*   Basic Metabolic Panel:  Recent Labs Lab 09/04/15 0400 09/05/15 0732 09/06/15 0741 09/07/15 0556 09/08/15 0533  NA 134* 132* 132* 130* 128*  K 3.1* 4.1 4.9 4.8 4.7  CL 97* 99* 98* 96* 96*  CO2 23 21* 25 24 25   GLUCOSE 212* 170* 159* 152* 130*  BUN 17 33* 44* 38* 71*  CREATININE 1.30* 1.42* 1.21* 0.98 1.13*  CALCIUM 9.7 9.6 9.3 9.1 8.5*  MG 2.7* 2.4  --   --   --   PHOS 3.0  --   --   --   --    GFR: Estimated Creatinine Clearance: 40.9 mL/min (by C-G formula based on Cr of 1.13). Liver Function Tests:  Recent Labs Lab 09/03/15 1815 09/04/15 0400 09/05/15 0732  AST 29 28 43*  ALT 41 39 46  ALKPHOS 130* 119 104  BILITOT 0.6 0.4 0.4  PROT 7.4 7.2 7.0  ALBUMIN 4.2 3.9 3.8   No results for input(s): LIPASE, AMYLASE in the last 168 hours. No results for input(s): AMMONIA in the last 168 hours. Coagulation Profile: No results for input(s): INR, PROTIME in the last 168 hours. Cardiac Enzymes: No results for input(s): CKTOTAL, CKMB, CKMBINDEX, TROPONINI in the last 168 hours. BNP (last 3 results) No results for input(s): PROBNP in the last 8760 hours. HbA1C: No results for input(s): HGBA1C in the last 72 hours. CBG:  Recent Labs Lab 09/07/15 0751 09/07/15 1216 09/07/15 1652 09/07/15 2121 09/08/15 0758  GLUCAP 156* 166* 139* 228* 140*   Lipid Profile: No results for input(s): CHOL, HDL, LDLCALC, TRIG, CHOLHDL, LDLDIRECT in the last 72 hours. Thyroid Function Tests: No results for input(s): TSH, T4TOTAL, FREET4, T3FREE, THYROIDAB in the last 72 hours. Anemia Panel: No results for input(s): VITAMINB12, FOLATE, FERRITIN, TIBC, IRON, RETICCTPCT in the last 72 hours. Urine analysis:    Component Value Date/Time   COLORURINE YELLOW 09/02/2007 1301   APPEARANCEUR CLEAR 09/02/2007 1301   LABSPEC 1.015 09/02/2007 1301   PHURINE 5.5 09/02/2007 1301   GLUCOSEU NEGATIVE 09/02/2007 1301   HGBUR NEGATIVE 09/02/2007 1301   BILIRUBINUR NEGATIVE  09/02/2007 1301   KETONESUR NEGATIVE 09/02/2007 1301   PROTEINUR NEGATIVE 09/02/2007 1301   UROBILINOGEN 0.2 09/02/2007 1301   NITRITE NEGATIVE 09/02/2007 1301   LEUKOCYTESUR  09/02/2007 1301    NEGATIVE MICROSCOPIC NOT DONE ON URINES WITH NEGATIVE PROTEIN, BLOOD, LEUKOCYTES, NITRITE, OR GLUCOSE <1000 mg/dL.   Sepsis Labs: @LABRCNTIP (procalcitonin:4,lacticidven:4)   Recent Results (from the past 240 hour(s))  MRSA PCR Screening     Status: None   Collection Time: 09/04/15  5:35 PM  Result Value Ref Range Status   MRSA by PCR NEGATIVE NEGATIVE Final      Radiology Studies: Ct Angio Chest Pe W Or Wo Contrast 09/03/2015   No evidence  of pulmonary embolism. No acute cardiopulmonary disease. Minimal atherosclerotic coronary artery disease.   Dg Chest Port 1 View 09/03/2015  Bilateral mid and lower lung heterogeneous opacities favored to represent atelectasis. Infection not excluded.   Marland Kitchen amLODipine  10 mg Oral Daily  . atorvastatin  20 mg Oral Daily  . citalopram  20 mg Oral Daily  . docusate sodium  100 mg Oral BID  . enoxaparin (LOVENOX) injection  40 mg Subcutaneous Q24H  . gabapentin  300 mg Oral TID  . guaiFENesin  600 mg Oral BID  . insulin aspart  0-15 Units Subcutaneous TID WC  . ipratropium-albuterol  3 mL Nebulization TID  . loratadine  10 mg Oral Daily  . metoprolol tartrate  50 mg Oral BID  . montelukast  10 mg Oral QHS  . sodium chloride flush  3 mL Intravenous Q12H     Continuous Infusions:    LOS: 5 days    Time spent: 25 minutes  Greater than 50% of the time spent on counseling and coordinating the care.   Leisa Lenz, MD [facility] Pager [phone]  If 7PM-7AM, please contact night-coverage [email or web address] Password [email or web address] 09/08/2015, 10:46 AM

## 2015-09-08 NOTE — Progress Notes (Signed)
2120 - Paged oncall NP and made aware of patient having black tarry stool. Awaiting orders to be placed, Will continue to monitor.

## 2015-09-08 NOTE — Progress Notes (Signed)
0000 Patient had a large tarry - bloody stool, Made Schorr aware, Orders placed. Hemoglobin dropped to 10.6, Schorr made aware. Labs will be rechecked at 400, will continue to monitor.

## 2015-09-09 ENCOUNTER — Encounter (HOSPITAL_COMMUNITY)
Admission: EM | Disposition: A | Discharge status: Home Health | Payer: Self-pay | Source: Home / Self Care | Attending: Internal Medicine

## 2015-09-09 ENCOUNTER — Encounter (HOSPITAL_COMMUNITY): Payer: Self-pay | Admitting: *Deleted

## 2015-09-09 DIAGNOSIS — K921 Melena: Secondary | ICD-10-CM | POA: Insufficient documentation

## 2015-09-09 DIAGNOSIS — K264 Chronic or unspecified duodenal ulcer with hemorrhage: Secondary | ICD-10-CM | POA: Insufficient documentation

## 2015-09-09 DIAGNOSIS — D62 Acute posthemorrhagic anemia: Secondary | ICD-10-CM | POA: Insufficient documentation

## 2015-09-09 HISTORY — PX: ESOPHAGOGASTRODUODENOSCOPY: SHX5428

## 2015-09-09 LAB — CBC
HCT: 23.7 % — ABNORMAL LOW (ref 36.0–46.0)
HEMATOCRIT: 27.5 % — AB (ref 36.0–46.0)
HEMATOCRIT: 25.6 % — AB (ref 36.0–46.0)
HEMOGLOBIN: 9.1 g/dL — AB (ref 12.0–15.0)
HEMOGLOBIN: 8.6 g/dL — AB (ref 12.0–15.0)
Hemoglobin: 8.1 g/dL — ABNORMAL LOW (ref 12.0–15.0)
MCH: 26.9 pg (ref 26.0–34.0)
MCH: 27.2 pg (ref 26.0–34.0)
MCH: 28.2 pg (ref 26.0–34.0)
MCHC: 33.1 g/dL (ref 30.0–36.0)
MCHC: 33.6 g/dL (ref 30.0–36.0)
MCHC: 34.2 g/dL (ref 30.0–36.0)
MCV: 81.4 fL (ref 78.0–100.0)
MCV: 81 fL (ref 78.0–100.0)
MCV: 82.6 fL (ref 78.0–100.0)
PLATELETS: 500 10*3/uL — AB (ref 150–400)
Platelets: 481 10*3/uL — ABNORMAL HIGH (ref 150–400)
Platelets: 473 10*3/uL — ABNORMAL HIGH (ref 150–400)
RBC: 3.38 MIL/uL — ABNORMAL LOW (ref 3.87–5.11)
RBC: 3.16 MIL/uL — ABNORMAL LOW (ref 3.87–5.11)
RBC: 2.87 MIL/uL — ABNORMAL LOW (ref 3.87–5.11)
RDW: 14 % (ref 11.5–15.5)
RDW: 14 % (ref 11.5–15.5)
RDW: 14.3 % (ref 11.5–15.5)
WBC: 33.3 10*3/uL — AB (ref 4.0–10.5)
WBC: 40.5 10*3/uL — ABNORMAL HIGH (ref 4.0–10.5)
WBC: 43.6 10*3/uL — AB (ref 4.0–10.5)

## 2015-09-09 LAB — GLUCOSE, CAPILLARY
GLUCOSE-CAPILLARY: 158 mg/dL — AB (ref 65–99)
Glucose-Capillary: 138 mg/dL — ABNORMAL HIGH (ref 65–99)
Glucose-Capillary: 145 mg/dL — ABNORMAL HIGH (ref 65–99)
Glucose-Capillary: 104 mg/dL — ABNORMAL HIGH (ref 65–99)

## 2015-09-09 LAB — PROTIME-INR
INR: 1.16 (ref 0.00–1.49)
Prothrombin Time: 15 seconds (ref 11.6–15.2)

## 2015-09-09 LAB — BASIC METABOLIC PANEL
ANION GAP: 10 (ref 5–15)
BUN: 70 mg/dL — ABNORMAL HIGH (ref 6–20)
CALCIUM: 8.4 mg/dL — AB (ref 8.9–10.3)
CO2: 23 mmol/L (ref 22–32)
Chloride: 100 mmol/L — ABNORMAL LOW (ref 101–111)
Creatinine, Ser: 1.33 mg/dL — ABNORMAL HIGH (ref 0.44–1.00)
GFR, EST AFRICAN AMERICAN: 42 mL/min — AB (ref >60)
GFR, EST NON AFRICAN AMERICAN: 36 mL/min — AB (ref >60)
Glucose, Bld: 158 mg/dL — ABNORMAL HIGH (ref 65–99)
Potassium: 4.3 mmol/L (ref 3.5–5.1)
SODIUM: 133 mmol/L — AB (ref 135–145)

## 2015-09-09 LAB — LACTIC ACID, PLASMA
LACTIC ACID, VENOUS: 1.7 mmol/L (ref 0.5–2.0)
LACTIC ACID, VENOUS: 1.6 mmol/L (ref 0.5–2.0)

## 2015-09-09 LAB — PROCALCITONIN: Procalcitonin: 0.26 ng/mL

## 2015-09-09 LAB — C DIFFICILE QUICK SCREEN W PCR REFLEX
C DIFFICILE (CDIFF) TOXIN: NEGATIVE
C Diff antigen: NEGATIVE
C Diff interpretation: NEGATIVE

## 2015-09-09 SURGERY — EGD (ESOPHAGOGASTRODUODENOSCOPY)
Anesthesia: Moderate Sedation

## 2015-09-09 MED ORDER — LEVOFLOXACIN IN D5W 750 MG/150ML IV SOLN
750.0000 mg | INTRAVENOUS | Status: DC
  Filled 2015-09-09 (×2): qty 150

## 2015-09-09 MED ORDER — SODIUM CHLORIDE 0.9 % IV SOLN
INTRAVENOUS | Status: DC
  Administered 2015-09-09 – 2015-09-12 (×2): via INTRAVENOUS

## 2015-09-09 MED ORDER — FAMOTIDINE IN NACL 20-0.9 MG/50ML-% IV SOLN
20.0000 mg | Freq: Two times a day (BID) | INTRAVENOUS | Status: DC
  Administered 2015-09-09: 20 mg via INTRAVENOUS
  Filled 2015-09-09 (×2): qty 50

## 2015-09-09 MED ORDER — MIDAZOLAM HCL 10 MG/2ML IJ SOLN
INTRAMUSCULAR | Status: DC | PRN
  Administered 2015-09-09 (×4): 1 mg via INTRAVENOUS

## 2015-09-09 MED ORDER — SODIUM CHLORIDE 0.9 % IV SOLN
80.0000 mg | INTRAVENOUS | Status: AC
  Administered 2015-09-09: 80 mg via INTRAVENOUS
  Filled 2015-09-09: qty 80

## 2015-09-09 MED ORDER — SACCHAROMYCES BOULARDII 250 MG PO CAPS
250.0000 mg | ORAL_CAPSULE | Freq: Two times a day (BID) | ORAL | Status: DC
  Administered 2015-09-10 – 2015-09-16 (×13): 250 mg via ORAL
  Filled 2015-09-09 (×13): qty 1

## 2015-09-09 MED ORDER — METRONIDAZOLE IN NACL 5-0.79 MG/ML-% IV SOLN: 500.0000 mg | Freq: Three times a day (TID) | INTRAVENOUS | Status: DC

## 2015-09-09 MED ORDER — FENTANYL CITRATE (PF) 100 MCG/2ML IJ SOLN
INTRAMUSCULAR | Status: DC | PRN
  Administered 2015-09-09: 25 ug via INTRAVENOUS

## 2015-09-09 MED ORDER — PIPERACILLIN-TAZOBACTAM 3.375 G IVPB 30 MIN
3.3750 g | INTRAVENOUS | Status: DC
  Filled 2015-09-09: qty 50

## 2015-09-09 MED ORDER — NALOXONE HCL 0.4 MG/ML IJ SOLN
0.4000 mg | Freq: Once | INTRAMUSCULAR | Status: AC
  Administered 2015-09-09: 0.4 mg via INTRAVENOUS
  Filled 2015-09-09: qty 1

## 2015-09-09 MED ORDER — SODIUM CHLORIDE 0.9 % IV SOLN: INTRAVENOUS | Status: DC

## 2015-09-09 MED ORDER — PIPERACILLIN-TAZOBACTAM 3.375 G IVPB 30 MIN
3.3750 g | INTRAVENOUS | Status: AC
  Administered 2015-09-09: 3.375 g via INTRAVENOUS
  Filled 2015-09-09: qty 50

## 2015-09-09 MED ORDER — PANTOPRAZOLE SODIUM 40 MG IV SOLR
40.0000 mg | Freq: Two times a day (BID) | INTRAVENOUS | Status: DC
  Administered 2015-09-13 (×2): 40 mg via INTRAVENOUS
  Filled 2015-09-09 (×2): qty 40

## 2015-09-09 MED ORDER — METOPROLOL TARTRATE 5 MG/5ML IV SOLN
5.0000 mg | Freq: Once | INTRAVENOUS | Status: AC
  Administered 2015-09-09: 5 mg via INTRAVENOUS
  Filled 2015-09-09: qty 5

## 2015-09-09 MED ORDER — FENTANYL CITRATE (PF) 100 MCG/2ML IJ SOLN
INTRAMUSCULAR | Status: AC
  Filled 2015-09-09: qty 2

## 2015-09-09 MED ORDER — SODIUM CHLORIDE 0.9 % IJ SOLN
PREFILLED_SYRINGE | INTRAMUSCULAR | Status: DC | PRN
  Administered 2015-09-09: 1 mL

## 2015-09-09 MED ORDER — VANCOMYCIN 50 MG/ML ORAL SOLUTION
125.0000 mg | Freq: Four times a day (QID) | ORAL | Status: DC
  Filled 2015-09-09 (×5): qty 2.5

## 2015-09-09 MED ORDER — PIPERACILLIN-TAZOBACTAM 3.375 G IVPB
3.3750 g | Freq: Three times a day (TID) | INTRAVENOUS | Status: DC
  Administered 2015-09-09 – 2015-09-15 (×17): 3.375 g via INTRAVENOUS
  Filled 2015-09-09 (×21): qty 50

## 2015-09-09 MED ORDER — MIDAZOLAM HCL 5 MG/ML IJ SOLN
INTRAMUSCULAR | Status: AC
  Filled 2015-09-09: qty 2

## 2015-09-09 MED ORDER — SODIUM CHLORIDE 0.9 % IV SOLN
8.0000 mg/h | INTRAVENOUS | Status: DC
  Administered 2015-09-09 – 2015-09-12 (×5): 8 mg/h via INTRAVENOUS
  Filled 2015-09-09 (×14): qty 80

## 2015-09-09 NOTE — Progress Notes (Addendum)
Patient ID: [name], female   DOB: 1932-08-26, 82 y.o.   MRN: [id number]  PROGRESS NOTE    [name]  [id number] DOB: [birth year] DOA: 09/03/2015  PCP: Odette Fraction, MD   Brief Narrative:  82 y.o. female with past medical history significant for hypertension, spinal stenosis, dyslipidemia, asthma. Patient presented to [facility] with worsening shortness of breath over past 3 days prior to this admission. She used at home inhalers with no significant symptomatic relief.  She initially presented to urgent care and found to be hypoxic around 80s and she was referred to emergency department for further evaluation. She was admitted for management of asthma exacerbation.  More lethargic on 6/22 afternoon, stable vitals. MRI with no acute intracranial findings.  Had few episodes of tarry stool 6/22 but hemoglobin stable. GI will see her in consultation.   Assessment & Plan:  Acute blood loss anemia / Acute upper GI bleed  - Concern for upper GI bleed, has blood and tarry stools - Hemoglobin 10.7 --> 10.1 --> 9.1 - We will start Pepcid IV for reason that she may have C.diff colitis and protonix may not be the best option - She is NPO at this time other than ice chips - GI will see her in consultation, appreciate their input  - May need CT abdomen, will follow up with GI recommendations (she would need to get it without the contrast due to CRI) - Will give IV fluids at 50 cc/hr   Leukocytosis / SIRS / C. difficile colitis or ischemic colitis versus aspiration pneumonia  - Initially thought to be secondary to steroids. Steroids stopped 09/07/2015. Prior to that she has gotten prednisone 50 mg 6/21 and prior to that she was on IV solumedrol 60 mg Q 12 hours. - SIRS criteria met on admission. This am, she is stable other than high WBC count, may be impending sepsis versus steroids  - White bood cell count is still high at 33.3 this morning, concerning for possible C.  difficile colitis, colonic ischemia, aspiration pneumonia. Other than today, she was not on any antibiotics from the time of the admission. - We'll start empiric vancomycin for possible C. difficile, collect stool for C. Difficile - Also started Zosyn for possible aspiration pneumonia or ischemic colitis - We'll check lactic acid and pro-calcitonin level   Acute respiratory failure with hypoxia /Severe Acute Asthma exacerbation  - Stable respirator status - CXR showed mild patchy basilar lower lobe opacity (atelectasis or aspiration or pneumonia) - Continue current nebulizer treatments  Acute metabolic encephalopathy - Unclear etiology - Saturating 98% with [place] oxygen support - MRI with no acute intracranial findings - Will continue to monitor   Accelerated hypertension / Essential hypertension - Continue Norvasc and metoprolol  - Blood pressure 155/75  Chronic kidney disease stage III - Baseline creatinine 1.4 about 10 months ago - Creatinine on this admission 1.09 and up to 1.4  - Cr 1.33 this am   Hypokalemia - Likely secondary to nebulizer treatments - Potassium subsequently within normal limits - BMP pending this am  Depression - Continue Celexa  Dyslipidemia - Continue Lipitor 20 mg daily   Steroid induced hyperglycemia - A1c is 5.6 - Continue sliding scale insulin - CBG's in past 24 hours: 127, 128, 132  Recent falls - PT evaluation - HHPT, orders placed    DVT prophylaxis: SCD's bilaterally  Code Status: full code  Family Communication: called daughter Joelene Millin over the phone to give update  Disposition Plan: Not stable for discharge at this time. Transfer to SDU due to possible sepsis, ischemic colitis or severe C.diff colitis.  Consultants:   Physical therapy   Pulmonary - respiratory team   Gastroenterology - Velora Heckler   Procedures:   None  Antimicrobials:   Vanco PO 09/09/2015 -->  Levaquin 09/09/2015 1 dose  Zosyn 09/09/2015  -->   Subjective: More lethargic yesterday afternoon and also had tarry stools.  Objective: Filed Vitals:   09/08/15 1836 09/08/15 2120 09/08/15 2127 09/09/15 0556  BP: 155/79 142/80  131/67  Pulse: 87 84 83 90  Temp: 98.6 F (37 C)   97.4 F (36.3 C)  TempSrc:      Resp: '18  20 18  '$ Height:      Weight:      SpO2: 97% 98% 92% 98%    Intake/Output Summary (Last 24 hours) at 09/09/15 1025 Last data filed at 09/08/15 2300  Gross per 24 hour  Intake    600 ml  Output    700 ml  Net   -100 ml   Filed Weights   09/03/15 1806 09/04/15 1545 09/05/15 1309  Weight: 72.576 kg (160 lb) 73.9 kg (162 lb 14.7 oz) 76.9 kg (169 lb 8.5 oz)    Examination:  General exam: no distress  Respiratory system: Coarse breath sounds, no rhonchi Cardiovascular system: S1 & S2 appreciated, RRR Gastrointestinal system: (+) BS, non distended abdomen  Central nervous system: Nonfocal  Extremities: No edema, palpable pulses  Skin: No lesions, ulcers  Psychiatry: drowsy but woke up to answer the question how is she doing, also she asked for cup of water.  Data Reviewed: I have personally reviewed following labs and imaging studies  CBC:  Recent Labs Lab 09/03/15 1815 09/04/15 0400 09/06/15 0741 09/07/15 0556 09/08/15 0036 09/08/15 0533 09/08/15 1651  WBC 13.8* 7.2 24.7* 23.8*  --  33.8*  --   NEUTROABS 10.4*  --   --   --   --   --   --   HGB 14.6 14.5 13.4 14.5 10.6* 10.7* 10.1*  HCT 43.7 43.9 41.0 44.1 32.3* 33.1* 30.5*  MCV 82.8 82.1 85.1 84.0  --  83.4  --   PLT 351 352 381 390  --  441*  --    Basic Metabolic Panel:  Recent Labs Lab 09/04/15 0400 09/05/15 0732 09/06/15 0741 09/07/15 0556 09/08/15 0533  NA 134* 132* 132* 130* 128*  K 3.1* 4.1 4.9 4.8 4.7  CL 97* 99* 98* 96* 96*  CO2 23 21* '25 24 25  '$ GLUCOSE 212* 170* 159* 152* 130*  BUN 17 33* 44* 38* 71*  CREATININE 1.30* 1.42* 1.21* 0.98 1.13*  CALCIUM 9.7 9.6 9.3 9.1 8.5*  MG 2.7* 2.4  --   --   --   PHOS 3.0  --    --   --   --    GFR: Estimated Creatinine Clearance: 40.9 mL/min (by C-G formula based on Cr of 1.13). Liver Function Tests:  Recent Labs Lab 09/03/15 1815 09/04/15 0400 09/05/15 0732  AST 29 28 43*  ALT 41 39 46  ALKPHOS 130* 119 104  BILITOT 0.6 0.4 0.4  PROT 7.4 7.2 7.0  ALBUMIN 4.2 3.9 3.8   No results for input(s): LIPASE, AMYLASE in the last 168 hours. No results for input(s): AMMONIA in the last 168 hours. Coagulation Profile: No results for input(s): INR, PROTIME in the last 168 hours. Cardiac Enzymes: No results for input(s): CKTOTAL, CKMB,  CKMBINDEX, TROPONINI in the last 168 hours. BNP (last 3 results) No results for input(s): PROBNP in the last 8760 hours. HbA1C: No results for input(s): HGBA1C in the last 72 hours. CBG:  Recent Labs Lab 09/07/15 2121 09/08/15 0758 09/08/15 1153 09/08/15 1713 09/08/15 2116  GLUCAP 228* 140* 127* 128* 132*   Lipid Profile: No results for input(s): CHOL, HDL, LDLCALC, TRIG, CHOLHDL, LDLDIRECT in the last 72 hours. Thyroid Function Tests: No results for input(s): TSH, T4TOTAL, FREET4, T3FREE, THYROIDAB in the last 72 hours. Anemia Panel: No results for input(s): VITAMINB12, FOLATE, FERRITIN, TIBC, IRON, RETICCTPCT in the last 72 hours. Urine analysis:    Component Value Date/Time   COLORURINE YELLOW 09/02/2007 1301   APPEARANCEUR CLEAR 09/02/2007 1301   LABSPEC 1.015 09/02/2007 1301   PHURINE 5.5 09/02/2007 1301   GLUCOSEU NEGATIVE 09/02/2007 1301   HGBUR NEGATIVE 09/02/2007 1301   BILIRUBINUR NEGATIVE 09/02/2007 1301   KETONESUR NEGATIVE 09/02/2007 1301   PROTEINUR NEGATIVE 09/02/2007 1301   UROBILINOGEN 0.2 09/02/2007 1301   NITRITE NEGATIVE 09/02/2007 1301   LEUKOCYTESUR  09/02/2007 1301    NEGATIVE MICROSCOPIC NOT DONE ON URINES WITH NEGATIVE PROTEIN, BLOOD, LEUKOCYTES, NITRITE, OR GLUCOSE <1000 mg/dL.   Sepsis Labs: '@LABRCNTIP'$ (procalcitonin:4,lacticidven:4)   Recent Results (from the past 240 hour(s))   MRSA PCR Screening     Status: None   Collection Time: 09/04/15  5:35 PM  Result Value Ref Range Status   MRSA by PCR NEGATIVE NEGATIVE Final      Radiology Studies:  Mr Brain Wo Contrast 09/08/2015 1.  No acute intracranial abnormality. 2. 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 3. Left middle cranial fossa arachnoid cyst, most likely congenital and inconsequential. 4. Acute on chronic appearing right maxillary sinusitis.   Dg Chest Port 1 View 09/08/2015  Mild patchy basilar right lower lobe opacity, which could represent atelectasis, aspiration or pneumonia. Recommend follow-up PA and lateral post treatment chest radiographs in 4-6 weeks.   Ct Angio Chest Pe W Or Wo Contrast 09/03/2015   No evidence of pulmonary embolism. No acute cardiopulmonary disease. Minimal atherosclerotic coronary artery disease.   Dg Chest Port 1 View 09/03/2015  Bilateral mid and lower lung heterogeneous opacities favored to represent atelectasis. Infection not excluded.   Marland Kitchen amLODipine  10 mg Oral Daily  . atorvastatin  20 mg Oral Daily  . citalopram  20 mg Oral Daily  . docusate sodium  100 mg Oral BID  . gabapentin  300 mg Oral TID  . guaiFENesin  600 mg Oral BID  . insulin aspart  0-15 Units Subcutaneous TID WC  . ipratropium-albuterol  3 mL Nebulization TID  . loratadine  10 mg Oral Daily  . metoprolol tartrate  25 mg Oral BID  . montelukast  10 mg Oral QHS  . sodium chloride flush  3 mL Intravenous Q12H     Continuous Infusions:    LOS: 6 days    Time spent: 25 minutes  Greater than 50% of the time spent on counseling and coordinating the care.   Leisa Lenz, MD [facility] Pager [phone]  If 7PM-7AM, please contact night-coverage [email or web address] Password [facility] 09/09/2015, 6:32 AM

## 2015-09-09 NOTE — Progress Notes (Signed)
   09/09/15 1500  Clinical Encounter Type  Visited With Patient not available  Visit Type Other (Comment)  Referral From Nurse  Consult/Referral To Chaplain  [facility] responded to [facility] request for help with  POA after patient returns from endoscope. [facility]P left hospital AD on bedside table. Note that hospital ADs can only be notarized weekdays before 3.  [facility]P will check back before 5 to see if patient has arrived. [facility] will recommend on call check about clarifying patient's need, if time permits. Roe Coombs 09/09/2015

## 2015-09-09 NOTE — Consult Note (Signed)
Consultation  Referring Provider: Dr. Charlies Silvers     Primary Care Physician:  Odette Fraction, MD Primary Gastroenterologist: Marylee Floras- Dr. Barney Drain Reason for Consultation: Anemia            HPI:   [name] is an 82 y.o. Caucasian female with a medical hx sig for CKD, HTN, spinal stenosis, GERD, HLD and Asthma who presented to the ED on 09/03/15 with a 3d h/o dyspnea and wheezing. We are consulted today in regards to pt continual leukocytosis, diarrhea, anemia and possible hematochezia.  At the time of my interview patient is disoriented and drowsy. Her history is garnered from previous notes and nursing report, as well as from her daughter via phone. Per nursing patient started with melenic stool on the night of 09/07/15, this continued until 2 PM on 6/22 with multiple stools, "at least 8". She then started again this morning around 6:30 with melena which is enough to "fill her bed sheet". Patient does have history of nausea, per daughter, this has been going on for the past 8 days and she has had a decrease in appetite, in fact her daughter reports that she has not eaten for 8 days, nursing confirms that she has had small sips of boost and a little bit of applesauce as late as 9:00 this morning. Patient's daughter explains that her mother uses NSAIDs but only "every once in a while". Per daughter, her mother was not complaining any of any abdominal pain within the past week or 2.  Patient's medical history is positive for her last EGD and colonoscopy in 2008. Per daughter no history of GERD.  Per nursing patient has been oriented, as recently as yesterday afternoon, but on since then.  Past GI History: 09/12/14-Labs with [facility]-IFOBT neg-plans for colo in 2018 if health permits [id number] Laban Emperor, FNP- Discussed screening colo 2018, yearly IFOBT until then, isolated mild elevation in ALT at 62-thought to be fatty liver/drug effect, recheck sept 08/07/11-DG  Esophagus- mild to mod esophageal dysmotility, no evidence of esophageal mass or stricture, laryngeal penetration w/o aspiration 2008-EGD, Dr. Oneida Alar- with normal esophagus without evidence of Barrett's, mild erythema in the antrum, distorted pylorus consistent with prior ulcer disease; Path-gastric body mucosa with associated mild chronic inflammation, no h.pylori or intestinal metaplasia, dysplasia or malignancy; Duodenum with peptic duodenitis 2008-Colonoscopy, Dr. Elane Fritz sigmoid colon diverticula, otherwise no polyps, masses, normal terminal ileum   History garnered from previous records. Past Medical History  Diagnosis Date  . Hypercholesterolemia   . Hypertension   . Mitral valve prolapse   . Mild acid reflux   . Staph infection      history of a wound infection following a previous right hip surgery  . CKD (chronic kidney disease) stage 3, GFR 30-59 ml/min   . Spinal stenosis   . Renal insufficiency     Past Surgical History  Procedure Laterality Date  . Right breast lumpectomy    . Left total hip replacement arthroplasty  June of 2001  . Right total hip replacement arthroplasty  August of 2001  . Abcess drainage    . Colonoscopy  04/30/01    [id number] hemorrhoids, otherwise, normal rectum/colon/Normal terminal ileum  . Esophagogastroduodenoscopy    03/01/2007    [id number] esophagus without evidence of Barrett's, mass/Mild erythema in the antrum.  Distorted pylorus consistent with prior ulcer disease.  Biopsies obtained/ 3. Junction of D1 and D2 erythematous, edematous, and strictured.  The lumen was narrowed to approximately  10 mm.   . Colonoscopy  03/02/2007    [id number] sigmoid colon diverticula.  Otherwise no polyps, masses/Normal terminal ileum.  Approximately 10-15 cm of the distal terminal ileum visualized/normal view of the rectum  . Total knee arthroplasty    . Abdominal hysterectomy      Family History  Problem Relation Age of Onset  . Colon cancer Neg Hx      Social History  Substance Use Topics  . Smoking status: Never Smoker   . Smokeless tobacco: Never Used  . Alcohol Use: No    Prior to Admission medications   Medication Sig Start Date End Date Taking? Authorizing Provider  albuterol (PROVENTIL HFA;VENTOLIN HFA) 108 (90 BASE) MCG/ACT inhaler Inhale 2 puffs into the lungs every 4 (four) hours as needed for wheezing or shortness of breath. 05/25/14  Yes Susy Frizzle, MD  albuterol (PROVENTIL) (2.5 MG/3ML) 0.083% nebulizer solution Take 3 mLs (2.5 mg total) by nebulization every 6 (six) hours as needed for wheezing or shortness of breath. 06/14/14  Yes Susy Frizzle, MD  amLODipine (NORVASC) 10 MG tablet Take 1 tablet (10 mg total) by mouth daily. 05/04/15  Yes Susy Frizzle, MD  atorvastatin (LIPITOR) 20 MG tablet Take 1 tablet (20 mg total) by mouth daily. 05/04/15  Yes Susy Frizzle, MD  Biotin 2500 MCG CAPS Take 2,500 mg by mouth daily.   Yes Historical Provider, MD  butalbital-aspirin-caffeine [facility]) 50-325-40 MG per capsule TAKE ONE CAPSULE BY MOUTH EVERY 8 HOURS AS NEEDED FOR HEADACHE 04/15/14  Yes Susy Frizzle, MD  cetirizine (ZYRTEC) 10 MG tablet Take 10 mg by mouth daily.   Yes Historical Provider, MD  citalopram (CELEXA) 20 MG tablet TAKE 1 TABLET DAILY 11/04/14  Yes Susy Frizzle, MD  cyclobenzaprine (FLEXERIL) 5 MG tablet Take 2.5-5 mg by mouth 3 (three) times daily as needed for muscle spasms.   Yes Historical Provider, MD  fish oil-omega-3 fatty acids 1000 MG capsule Take 2 g by mouth daily.   Yes Historical Provider, MD  gabapentin (NEURONTIN) 300 MG capsule Take 1 capsule (300 mg total) by mouth 3 (three) times daily. 01/10/15  Yes Susy Frizzle, MD  HYDROcodone-acetaminophen (NORCO/VICODIN) 5-325 MG per tablet Take 1 tablet by mouth every 4 (four) hours as needed for moderate pain. Patient taking differently: Take 0.5-1 tablets by mouth every 4 (four) hours as needed for moderate pain.  12/18/13  Yes  Susy Frizzle, MD  losartan-hydrochlorothiazide (HYZAAR) 50-12.5 MG tablet Take 1 tablet by mouth daily. 01/10/15  Yes Susy Frizzle, MD  montelukast (SINGULAIR) 10 MG tablet Take 1 tablet (10 mg total) by mouth at bedtime. 11/01/14  Yes Susy Frizzle, MD  Multiple Vitamin (MULTIVITAMIN) capsule Take 1 capsule by mouth daily.   Yes Historical Provider, MD  vitamin B-12 (CYANOCOBALAMIN) 500 MCG tablet Take 500 mcg by mouth daily.   Yes Historical Provider, MD  clotrimazole-betamethasone (LOTRISONE) cream Apply 1 application topically 2 (two) times daily. Patient taking differently: Apply 1 application topically 2 (two) times daily as needed (for irritation).  07/02/13   Susy Frizzle, MD    Current Facility-Administered Medications  Medication Dose Route Frequency Provider Last Rate Last Dose  . 0.9 %  sodium chloride infusion   Intravenous Continuous Robbie Lis, MD      . acetaminophen (TYLENOL) tablet 650 mg  650 mg Oral Q6H PRN Toy Baker, MD   650 mg at 09/06/15 2144   Or  .  acetaminophen (TYLENOL) suppository 650 mg  650 mg Rectal Q6H PRN Toy Baker, MD      . amLODipine (NORVASC) tablet 10 mg  10 mg Oral Daily Toy Baker, MD   10 mg at 09/09/15 0843  . atorvastatin (LIPITOR) tablet 20 mg  20 mg Oral Daily Toy Baker, MD   20 mg at 09/09/15 0841  . famotidine (PEPCID) IVPB 20 mg premix  20 mg Intravenous Q12H Robbie Lis, MD      . guaiFENesin [facility]) 12 hr tablet 600 mg  600 mg Oral BID Toy Baker, MD   600 mg at 09/09/15 0841  . insulin aspart (novoLOG) injection 0-15 Units  0-15 Units Subcutaneous TID [facility] [place] Marisa Hua, MD   3 Units at 09/09/15 [phone]  . ipratropium-albuterol (DUONEB) 0.5-2.5 (3) MG/3ML nebulizer solution 3 mL  3 mL Nebulization Q4H PRN Robbie Lis, MD      . ipratropium-albuterol (DUONEB) 0.5-2.5 (3) MG/3ML nebulizer solution 3 mL  3 mL Nebulization TID Robbie Lis, MD   3 mL at 09/09/15 0741  . loratadine  (CLARITIN) tablet 10 mg  10 mg Oral Daily Toy Baker, MD   10 mg at 09/09/15 0841  . metoprolol tartrate (LOPRESSOR) tablet 25 mg  25 mg Oral BID Robbie Lis, MD   25 mg at 09/09/15 0841  . montelukast (SINGULAIR) tablet 10 mg  10 mg Oral QHS Toy Baker, MD   10 mg at 09/08/15 2153  . ondansetron (ZOFRAN) injection 4 mg  4 mg Intravenous Q6H PRN Toy Baker, MD   4 mg at 09/08/15 0856  . saccharomyces boulardii (FLORASTOR) capsule 250 mg  250 mg Oral BID Robbie Lis, MD      . sodium chloride flush (NS) 0.9 % injection 3 mL  3 mL Intravenous Q12H Toy Baker, MD   3 mL at 09/08/15 2154    Allergies as of 09/03/2015 - Review Complete 09/03/2015  Allergen Reaction Noted  . Demerol [meperidine] Other (See Comments) 09/24/2013  . Meperidine hcl Other (See Comments) 08/13/2008     Review of Systems:    Unable to perform due to mental status   Physical Exam:  Vital signs in last 24 hours: Temp:  [97.4 F (36.3 C)-98.6 F (37 C)] 97.4 F (36.3 C) (06/23 0556) Pulse Rate:  [71-90] 86 (06/23 0841) Resp:  [18-20] 18 (06/23 0556) BP: (131-166)/(67-83) 155/75 mmHg (06/23 0841) SpO2:  [92 %-98 %] 98 % (06/23 0741) Last BM Date: 09/08/15 General:   Pleasant Caucasian female appears to be in NAD, Well developed, Well nourished, drowsy, but easily awoken Head:  Normocephalic and atraumatic. Eyes:   PEERL, EOMI. No icterus. Conjunctiva pink. Ears:  Normal auditory acuity. Neck:  Supple Throat: Limited exam, though visualized Oral cavity is without inflammation, swelling or lesion.  Lungs: Respirations even and unlabored. Coarse breath sounds and wheezing b/l lung fields Heart: Normal S1, S2. No MRG. Regular rate and rhythm. No peripheral edema, cyanosis or pallor.  Abdomen:  Soft, Mild distension, Mod Generalized ttp. Hyperactive bowel sounds in all 4 quadrants No appreciable masses or hepatomegaly. Rectal:  Digital exam not performed as patient is currently  having melenic stool Msk:  Symmetrical without gross deformities. Peripheral pulses intact.  Extremities:  Without edema, no deformity or joint abnormality.  Neurologic:  Drowsy and disoriented.  Skin:   Dry and intact without significant lesions or rashes. Psychiatric: Unable to evaluate as patient is disoriented and drowsy   LAB RESULTS:  Recent Labs  09/07/15 0556  09/08/15 0533 09/08/15 1651 09/09/15 0656  WBC 23.8*  --  33.8*  --  33.3*  HGB 14.5  < > 10.7* 10.1* 9.1*  HCT 44.1  < > 33.1* 30.5* 27.5*  PLT 390  --  441*  --  481*  < > = values in this interval not displayed. BMET  Recent Labs  09/07/15 0556 09/08/15 0533 09/09/15 0656  NA 130* 128* 133*  K 4.8 4.7 4.3  CL 96* 96* 100*  CO2 24 25 23   GLUCOSE 152* 130* 158*  BUN 38* 71* 70*  CREATININE 0.98 1.13* 1.33*  CALCIUM 9.1 8.5* 8.4*   LFT No results for input(s): PROT, ALBUMIN, AST, ALT, ALKPHOS, BILITOT, BILIDIR, IBILI in the last 72 hours. PT/INR No results for input(s): LABPROT, INR in the last 72 hours.  STUDIES: Mr Herby Abraham Contrast  09/08/2015  CLINICAL DATA:  82 year old female with increasing shortness of breath and lethargy. Initial encounter. EXAM: MRI HEAD WITHOUT CONTRAST TECHNIQUE: Multiplanar, multiecho pulse sequences of the brain and surrounding structures were obtained without intravenous contrast. COMPARISON:  None. FINDINGS: Mildly heterogeneous diffusion weighted imaging, including in the brainstem (series 3, image 21), but no convincing No restricted diffusion or evidence of acute infarction. Major intracranial vascular flow voids are preserved, with generalized intracranial artery dolichoectasia. 3.7 cm CSF isointense simple appearing fluid collection in the left middle cranial fossa most compatible with arachnoid cyst (series 7, image 9 and series 5, image 18). No other extra-axial collection. No other intracranial mass effect. No midline shift. No ventriculomegaly, or acute intracranial  hemorrhage. Cervicomedullary junction and pituitary are within normal limits. Negative for age visualized cervical spine. Patchy and confluent cerebral white matter T2 and FLAIR hyperintensity, including involvement of the deep white matter capsules. T2 heterogeneity in the deep gray matter nuclei which appears mostly related to perivascular spaces. Patchy T2 hyperintensity in the pons. Mild increased perivascular spaces in the deep cerebellar nuclei. No cortical encephalomalacia or chronic cerebral blood products. Visible internal auditory structures appear normal. Mastoids are clear. There is a fluid level in the right maxillary sinus. Suggestion of superimposed right maxillary sinus mucous retention cysts as well as chronic periostitis. There is otherwise mild ethmoid and right frontal sinus mucosal thickening. Negative orbit and scalp soft tissues. Visualized bone marrow signal is within normal limits. IMPRESSION: 1.  No acute intracranial abnormality. 2. 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 3. Left middle cranial fossa arachnoid cyst, most likely congenital and inconsequential. 4. Acute on chronic appearing right maxillary sinusitis. Electronically Signed   By: Genevie Ann M.D.   On: 09/08/2015 20:49   Dg Chest Port 1 View  09/08/2015  CLINICAL DATA:  Pneumonia. EXAM: PORTABLE CHEST 1 VIEW COMPARISON:  09/03/2015 chest radiograph. FINDINGS: Stable cardiomediastinal silhouette with normal heart size. No pneumothorax. No pleural effusion. Mild patchy opacity in the basilar right lower lobe. No pulmonary edema . IMPRESSION: Mild patchy basilar right lower lobe opacity, which could represent atelectasis, aspiration or pneumonia. Recommend follow-up PA and lateral post treatment chest radiographs in 4-6 weeks. Electronically Signed   By: Ilona Sorrel M.D.   On: 09/08/2015 13:16      PREVIOUS ENDOSCOPIES:            Last colo/EGD 2008, see hpi   Impression / Plan:  Impression: 1. Acute blood loss anemia-hgb trending downward over past 2 days; 14.5  6/21 to 9.1 6/23, Per nursing patient started with melenic stool on the night of 09/07/15, this continued throughout the day on 6/22 until around 2 PM, this then stopped, but recurred this morning starting around 6 AM, melenic stool present at the time of my interview; h/o possible duodenal ulcers in the past-see EGD 2008-Consider PUD vs gastritis vs AVM vs colonic ischemia vs other 2. Leukocytosis-initially thought due to steroids, though has not decreased over past 2 days since stopping these, WBC remains at 33.3, Patient has remained afebrile during hospital stay, C.Diff pending;consider relation to C.Diff vs other source-pt on airborne and enteric precautions at this time;Patient was started on vancomycin and Zosyn for empiric coverage today 3. Diarrhea-per nursing started with melena, likely related 4. Acute respiratory failure- improved; thought related to acute asthma exacerbation: hospitalist following 5. Acute metabolic encephalopathy- hospitalist following  Plan: 1. Continue supportive measures 2. Agree with IV Pepcid for now, pending results of C.Diff-if neg, would recommend switch to BID PPI 3. Serial hgb q6h with transfusion as necessary 4. EGD planned for 2pm with Dr. Hilarie Fredrickson, if unrevealing may need to consider further imaging i.e. CT abd w/o (due to Cr) or colonscopy 5. Spoke with pt daughter Meliton Rattan, discussed risks and benefits of EGD, she agrees to proceed and will sign consent when she gets here around 12:00pm 6. Patient to remain strict NPO 7. Will await C. Diff results 8. Continue empiric abx coverage 9. Will check INR 10. Will discuss above with Dr. Hilarie Fredrickson, please await any further recs after time of procedure  Thank you for your kind consultation, we will continue to follow.  Lavone Nian  [facility]  09/09/2015, 9:53 AM Pager #: [phone]

## 2015-09-09 NOTE — Progress Notes (Signed)
Pharmacy Antibiotic Note  [name] is a 82 y.o. female admitted on 09/03/2015 with dyspnea and was started on Levaquin for asthma/PNA.  Now with possible C.diff colitis and colonic ischemia.  Pharmacy consulted to start empiric PO vancomycin and IV Zosyn.  Patient's renal function is worsening and her WBC is elevated (post steroid).  She remains afebrile.   Plan: - Zosyn 3.375gm IV Q8H, 4 hr infusion - PO Vanc 125mg  PO QID x 14 days.  F/U C.diff PCR to confirm. - Monitor renal fxn, clinical progress   Height: 5\' 6"  (167.6 cm) Weight: 169 lb 8.5 oz (76.9 kg) IBW/kg (Calculated) : 59.3  Temp (24hrs), Avg:97.9 F (36.6 C), Min:97.4 F (36.3 C), Max:98.6 F (37 C)   Recent Labs Lab 09/04/15 0400 09/05/15 0732 09/06/15 0741 09/07/15 0556 09/08/15 0533 09/09/15 0656  WBC 7.2  --  24.7* 23.8* 33.8* 33.3*  CREATININE 1.30* 1.42* 1.21* 0.98 1.13* 1.33*    Estimated Creatinine Clearance: 34.7 mL/min (by C-G formula based on Cr of 1.33).    Allergies  Allergen Reactions  . Demerol [Meperidine] Other (See Comments)    Altered mental status  . Meperidine Hcl Other (See Comments)    Altered mental status    Antimicrobials this admission: LVQ 6/23 >> 6/23 Zosyn 6/23 >> PO Vanc 6/23 >>  Dose adjustments this admission: N/A  Microbiology results: None yet   Fidelis Loth D. Mina Marble, PharmD, BCPS Pager:  [phone] 09/09/2015, 10:35 AM

## 2015-09-09 NOTE — Progress Notes (Signed)
Pharmacy Antibiotic Note  [name] is a 82 y.o. female admitted on 09/03/2015 with aspiration PNA.  Pharmacy has been consulted for levaquin dosing. Wt 76.9 kg, creat 1.13, creat cl ~ 47 ml/min WBC 33.8 (s/p steroids)  AF. CXR: mild patchy lower lobe opacity.   Plan: levaquin 750 mg IV q48hrs  Height: 5\' 6"  (167.6 cm) Weight: 169 lb 8.5 oz (76.9 kg) IBW/kg (Calculated) : 59.3  Temp (24hrs), Avg:97.9 F (36.6 C), Min:97.4 F (36.3 C), Max:98.6 F (37 C)   Recent Labs Lab 09/03/15 1815 09/04/15 0400 09/05/15 0732 09/06/15 0741 09/07/15 0556 09/08/15 0533  WBC 13.8* 7.2  --  24.7* 23.8* 33.8*  CREATININE 1.09* 1.30* 1.42* 1.21* 0.98 1.13*    Estimated Creatinine Clearance: 40.9 mL/min (by C-G formula based on Cr of 1.13).    Allergies  Allergen Reactions  . Demerol [Meperidine] Other (See Comments)    Altered mental status  . Meperidine Hcl Other (See Comments)    Altered mental status    Thank you for allowing pharmacy to be a part of this patient's care.  Eudelia Bunch, Pharm.D. [phone] 09/09/2015 6:55 AM

## 2015-09-09 NOTE — Progress Notes (Signed)
SLP Cancellation Note  Patient Details Name: [name] MRN: [id number] DOB: [birth year]   Cancelled treatment:       Reason Eval/Treat Not Completed: Medical issues which prohibited therapy. Pt NPO this morning pending GI consult, now for endoscopy. Will f/u as able.   Germain Osgood, M.A. CCC-SLP [phone]  Germain Osgood 09/09/2015, 2:26 PM

## 2015-09-09 NOTE — Care Management Important Message (Signed)
Important Message  Patient Details  Name: [name] MRN: [id number] Date of Birth: 1932-11-23   Medicare Important Message Given:  Yes    Emmajane Altamura Abena 09/09/2015, 11:27 AM

## 2015-09-09 NOTE — Progress Notes (Signed)
Call received per Wells Guiles RN at shift change regarding update on Patient status. Pt already on RRT follow up list, seen at 2040 this evening. Pt found resting in bed,lethargic, eyes closed but open to voice, follows simple commands.  Does not answer orientation questions but able to state name. Bilateral grips weak. Nods head no to pain. MAE weakly. Lung sounds clear, congested cough. VSS at this time. RN advised to monitor Patient closely this evening, RRT will follow.

## 2015-09-09 NOTE — Progress Notes (Signed)
Paged MD Rogue Bussing and notified of Hgb of 8.6 and WBC of 40.5. Pt remains lethargic, pale, and disoriented x 4. Pt able to move left arm, but does not move right when asked to raise. Rapid response notified.  No new orders given. Will continue to monitor.   Results for AMIL, [name] (MRN [id number]) as of 09/09/2015 19:44  Ref. Range 09/09/2015 18:52  WBC Latest Ref Range: 4.0-10.5 K/uL 40.5 (H)  RBC Latest Ref Range: 3.87-5.11 MIL/uL 3.16 (L)  Hemoglobin Latest Ref Range: 12.0-15.0 g/dL 8.6 (L)  HCT Latest Ref Range: 36.0-46.0 % 25.6 (L)  MCV Latest Ref Range: 78.0-100.0 fL 81.0  MCH Latest Ref Range: 26.0-34.0 pg 27.2  MCHC Latest Ref Range: 30.0-36.0 g/dL 33.6  RDW Latest Ref Range: 11.5-15.5 % 14.0  Platelets Latest Ref Range: 150-400 K/uL 473 (H)

## 2015-09-09 NOTE — Progress Notes (Signed)
   09/09/15 1600  Clinical Encounter Type  Visited With Family  Visit Type Follow-up  Referral From Chaplain  Consult/Referral To Chaplain  Spiritual Encounters  Spiritual Needs Other (Comment)  CHP met with daughter of patient to explain AD process and offer emotional support.  Patient will likely complete AD over the weekend and request notary. Roe Coombs 09/09/2015

## 2015-09-09 NOTE — Op Note (Signed)
[facility] Patient Name: [name] Procedure Date : 09/09/2015 MRN: [id number] Attending MD: Jerene Bears , MD Date of Birth: 1932-05-27 CSN: [id number] Age: 82 Admit Type: Inpatient Procedure:                Upper GI endoscopy Indications:              Generalized abdominal pain, Acute post hemorrhagic                            anemia, Melena Providers:                Lajuan Lines. Hilarie Fredrickson, MD, Vista Lawman, RN, Elspeth Cho, Technician, Lance Coon, CRNA Referring MD:             [facility] Service Medicines:                Fentanyl 25 micrograms IV, Midazolam 4 mg IV Complications:            No immediate complications. Estimated Blood Loss:     Estimated blood loss: none. Procedure:                Pre-Anesthesia Assessment:                           - Prior to the procedure, a History and Physical                            was performed, and patient medications and                            allergies were reviewed. The patient's tolerance of                            previous anesthesia was also reviewed. The risks                            and benefits of the procedure and the sedation                            options and risks were discussed with the patient.                            All questions were answered, and informed consent                            was obtained. Prior Anticoagulants: The patient has                            taken no previous anticoagulant or antiplatelet                            agents. ASA Grade Assessment: III - A patient with  severe systemic disease. After reviewing the risks                            and benefits, the patient was deemed in                            satisfactory condition to undergo the procedure.                           After obtaining informed consent, the endoscope was                            passed under direct vision. Throughout the                             procedure, the patient's blood pressure, pulse, and                            oxygen saturations were monitored continuously. The                            [id number]) scope was introduced through the                            mouth, and advanced to the second part of duodenum.                            The upper GI endoscopy was accomplished without                            difficulty. The patient tolerated the procedure                            well. Scope In: Scope Out: Findings:      LA Grade D (one or more mucosal breaks involving at least 75% of       esophageal circumference) esophagitis with no bleeding was found in the       lower third of the esophagus.      A deformity was found at the pylorus.      No gross lesions were noted in the entire examined stomach.      Large blood clot was found in the duodenal bulb and in the near the       D1-D2 junction. Suction was performed to dislodge the clot and improve       visualization.      Four non-obstructing cratered duodenal ulcers, 1 small ulcer with a       visible vessel, were found in the duodenal bulb and in the second       portion of the duodenum. The largest lesion was 15 mm in largest       dimension. The small ulcer was successfully injected with 2 mL of a       1:10,000 solution of epinephrine for hemostasis. For hemostasis, two       hemostatic clips were successfully placed (MR conditional). There was no       bleeding  at the end of the procedure. Impression:               - LA Grade D reflux esophagitis.                           - Acquired deformity in the pylorus.                           - No gross lesions in the stomach.                           - Blood in the duodenal bulb and in the second                            portion of the duodenum.                           - Multiple non-obstructing duodenal ulcers, 1 with                            a visible vessel.  Injected. Clips (MR conditional)                            were placed.                           - No specimens collected. Moderate Sedation:      Moderate (conscious) sedation was administered by the endoscopy nurse       and supervised by the endoscopist. The following parameters were       monitored: oxygen saturation, heart rate, blood pressure, and response       to care. Total physician intraservice time was 26 minutes. Recommendation:           - Return patient to hospital ward for ongoing care.                           - Clear liquid diet.                           - Begin pantoprazole infusion -72 hours given                            endoscopic therapy performed today.                           - check H. pylori antibody. Treat if positive                           - No aspirin, ibuprofen, naproxen, or other                            non-steroidal anti-inflammatory drugs.                           - Closely monitor hemoglobin. Transfuse if necessary                           -  The findings and recommendations were discussed                            with the patient's family. Procedure Code(s):        --- Professional ---                           [phone], Esophagogastroduodenoscopy, flexible,                            transoral; with control of bleeding, any method                           99152, Moderate sedation services provided by the                            same physician or other qualified health care                            professional performing the diagnostic or                            therapeutic service that the sedation supports,                            requiring the presence of an independent trained                            observer to assist in the monitoring of the                            patient's level of consciousness and physiological                            status; initial 15 minutes of intraservice time,                             patient age 65 years or older                           [phone], Moderate sedation services; each additional                            15 minutes intraservice time Diagnosis Code(s):        --- Professional ---                           K21.0, Gastro-esophageal reflux disease with                            esophagitis                           K31.89, Other diseases of stomach and duodenum  K92.2, Gastrointestinal hemorrhage, unspecified                           K26.4, Chronic or unspecified duodenal ulcer with                            hemorrhage                           R10.84, Generalized abdominal pain                           D62, Acute posthemorrhagic anemia                           K92.1, Melena (includes Hematochezia) CPT copyright 2016 [employer]. All rights reserved. The codes documented in this report are preliminary and upon coder review may  be revised to meet current compliance requirements. Jerene Bears, MD 09/09/2015 3:36:26 PM This report has been signed electronically. Number of Addenda: 0

## 2015-09-09 NOTE — Progress Notes (Signed)
Pt arrived to [place], pt not following commands and not oriented. Pt does open eyes to voice, but does not participate in exam. Pt appears pale, vitals stable with BP 145/93, HR 87 NSR, 02 90 on room air. MD Charlies Silvers notified of arrival and concerns of mental status. Serial CBC's ordered. Phlebotomy notified. Daughter at bedside and given update. Will continue to monitor.

## 2015-09-10 DIAGNOSIS — D72829 Elevated white blood cell count, unspecified: Secondary | ICD-10-CM | POA: Insufficient documentation

## 2015-09-10 LAB — GLUCOSE, CAPILLARY
GLUCOSE-CAPILLARY: 128 mg/dL — AB (ref 65–99)
GLUCOSE-CAPILLARY: 163 mg/dL — AB (ref 65–99)
GLUCOSE-CAPILLARY: 109 mg/dL — AB (ref 65–99)
GLUCOSE-CAPILLARY: 118 mg/dL — AB (ref 65–99)

## 2015-09-10 LAB — MAGNESIUM: Magnesium: 2.4 mg/dL (ref 1.7–2.4)

## 2015-09-10 LAB — URINALYSIS, ROUTINE W REFLEX MICROSCOPIC
Bilirubin Urine: NEGATIVE
GLUCOSE, UA: NEGATIVE mg/dL
HGB URINE DIPSTICK: NEGATIVE
KETONES UR: NEGATIVE mg/dL
Leukocytes, UA: NEGATIVE
Nitrite: NEGATIVE
PROTEIN: NEGATIVE mg/dL
Specific Gravity, Urine: 1.021 (ref 1.005–1.030)
pH: 5.5 (ref 5.0–8.0)

## 2015-09-10 LAB — CBC
HCT: 22.2 % — ABNORMAL LOW (ref 36.0–46.0)
HCT: 26.2 % — ABNORMAL LOW (ref 36.0–46.0)
Hemoglobin: 7.5 g/dL — ABNORMAL LOW (ref 12.0–15.0)
Hemoglobin: 8.7 g/dL — ABNORMAL LOW (ref 12.0–15.0)
MCH: 27.7 pg (ref 26.0–34.0)
MCH: 28.4 pg (ref 26.0–34.0)
MCHC: 33.8 g/dL (ref 30.0–36.0)
MCHC: 33.2 g/dL (ref 30.0–36.0)
MCV: 81.9 fL (ref 78.0–100.0)
MCV: 85.6 fL (ref 78.0–100.0)
PLATELETS: 408 10*3/uL — AB (ref 150–400)
PLATELETS: 397 10*3/uL (ref 150–400)
RBC: 2.71 MIL/uL — AB (ref 3.87–5.11)
RBC: 3.06 MIL/uL — ABNORMAL LOW (ref 3.87–5.11)
RDW: 14.2 % (ref 11.5–15.5)
RDW: 15.3 % (ref 11.5–15.5)
WBC: 35.3 10*3/uL — ABNORMAL HIGH (ref 4.0–10.5)
WBC: 31.2 10*3/uL — ABNORMAL HIGH (ref 4.0–10.5)

## 2015-09-10 LAB — BASIC METABOLIC PANEL
Anion gap: 7 (ref 5–15)
BUN: 50 mg/dL — AB (ref 6–20)
CHLORIDE: 110 mmol/L (ref 101–111)
CO2: 24 mmol/L (ref 22–32)
CREATININE: 1.48 mg/dL — AB (ref 0.44–1.00)
Calcium: 8.3 mg/dL — ABNORMAL LOW (ref 8.9–10.3)
GFR calc Af Amer: 37 mL/min — ABNORMAL LOW (ref >60)
GFR calc non Af Amer: 32 mL/min — ABNORMAL LOW (ref >60)
Glucose, Bld: 118 mg/dL — ABNORMAL HIGH (ref 65–99)
Potassium: 3.7 mmol/L (ref 3.5–5.1)
Sodium: 141 mmol/L (ref 135–145)

## 2015-09-10 LAB — ABO/RH: ABO/RH(D): O POS

## 2015-09-10 LAB — PREPARE RBC (CROSSMATCH)

## 2015-09-10 MED ORDER — SODIUM CHLORIDE 0.9 % IV SOLN: Freq: Once | INTRAVENOUS | Status: DC

## 2015-09-10 NOTE — Progress Notes (Addendum)
Patient ID: [name], female   DOB: 10/10/1932, 82 y.o.   MRN: [id number]  PROGRESS NOTE    [name]  [id number]:[id number] DOB: January 30, 1933 DOA: 09/03/2015  PCP: Odette Fraction, MD   Brief Narrative:  82 y.o. female with past medical history significant for hypertension, spinal stenosis, dyslipidemia, asthma. Patient presented to [facility] with worsening shortness of breath over past 3 days prior to this admission. She used at home inhalers with no significant symptomatic relief.  She initially presented to urgent care and found to be hypoxic around 80s and she was referred to emergency department for further evaluation. She was admitted for management of asthma exacerbation.  More lethargic on 6/22 afternoon, stable vitals. MRI with no acute intracranial findings.  Had few episodes of tarry stool 6/22 and GI did EGD 6/23 with findings indicated below. She was transferred to SDU 09/09/2015.   Assessment & Plan:  Acute blood loss anemia / Acute upper GI bleed secondary to duodenal bulb hemorrhage / Multiple duodenal ulcers one with visible vessel - Concern for upper GI bleed, has blood and tarry stools - EGD 09/09/2015 - LA Grade D reflux esophagitis. No gross lesions in the stomach. Blood in the duodenal bulb and in the second portion of the duodenum. Multiple non-obstructing duodenal ulcers, 1 with a visible vessel.  - She was started on Pepcid IV Q 12 hours 6/23 but after EGD started on protonix drip - Had 2 small BM with minimal blood - Her Hgb this am is 7.5 - She is getting 1 U PRBC this am - Continue to monitor in SDU - Appreciate GI following  - Diet - Clears if ok with SLP  Leukocytosis / Sepsis, unspecified organism / Possible aspiration pneumonitis  - Initially thought to be secondary to steroids. Steroids stopped 09/07/2015. Prior to that she has gotten prednisone 50 mg 6/21 and prior to that she was on IV solumedrol 60 mg Q 12 hours. - SIRS criteria met on  admission.  - Sepsis criteria met on 6/22. Sepsis protocol utilized. Lactic acid WNL, procalcitonin mildly elevated at 0.26. WBC count up to 43.6. RR was 14-20 and HR 74-105 - Unclear source of infection. CXR with possible pneumonia and she was started on zosyn 6/23. She was also given PO vanco 6/23 for possible C.diff colitis but will stop today as C.diff negative - Blood cultures pending  - She is hemodynamically stable this am - Her WBC count is trending down - SLP study to be done today  Acute respiratory failure with hypoxia /Acute Asthma exacerbation  - Stable respirator status - CXR 6/22 showed mild patchy basilar lower lobe opacity (atelectasis or aspiration or pneumonia) - Continue current nebulizer treatments: duoneb TID scheduled and every 4 hours as needed for shortness of breath or wheezing   Acute metabolic encephalopathy - Unclear etiology - MRI with no acute intracranial findings - Stable vitals - Mental status better this am   Accelerated hypertension / Essential hypertension - Continue Norvasc and metoprolol  - She has not gotten am dose BP meds so BP 176/73  Chronic kidney disease stage III - Baseline creatinine 1.4 about 10 months ago - Creatinine on this admission 1.09 and up to 1.4  - BMP pending this am  Hypokalemia - Likely secondary to nebulizer treatments - Potassium subsequently within normal limits - Follow up BMP this am  Depression - Discontinue celexa yesterday due to lethargy - Stable mental status this am, will continue to monitor -  Continue to hold celexa  Dyslipidemia - Continue Lipitor 20 mg daily   Steroid induced hyperglycemia - A1c is 5.6 - Continue sliding scale insulin - CBG's in past 24 hours: 138, 145, 104  Recent falls - PT evaluation - HHPT, orders placed    DVT prophylaxis: SCD's bilaterally  Code Status: full code  Family Communication: called daughter Joelene Millin over the phone to give update  Disposition Plan: Not  stable for discharge at this time. Continue to monitor in SDU, needs PRBC transfusion, protonix infusion.  Consultants:   Physical therapy   Pulmonary - respiratory team   Gastroenterology - [place]   Procedures:   EGD by Dr. Hilarie Fredrickson 09/09/2015 - LA Grade D reflux esophagitis. No gross lesions in the stomach. Blood in the duodenal bulb and in the second portion of the duodenum. Multiple non-obstructing duodenal ulcers, 1 with a visible vessel. Injected.    Protonix infusion 6/23 -->  1 U PRBC 09/10/2015   Antimicrobials:   Vanco PO 09/09/2015 --> 09/10/2015   Levaquin 09/09/2015 1 dose  Zosyn 09/09/2015 -->   Subjective: Look better, she says she is tired.  Objective: Filed Vitals:   09/09/15 2321 09/10/15 0427 09/10/15 0600 09/10/15 0630  BP: 139/81 136/60  176/73  Pulse: 88 96  105  Temp: 98 F (36.7 C) 98.5 F (36.9 C) 98 F (36.7 C) 97.9 F (36.6 C)  TempSrc: Oral Oral Axillary Oral  Resp: '17 15  19  '$ Height:      Weight:  74 kg (163 lb 2.3 oz)    SpO2: 92% 98%  98%    Intake/Output Summary (Last 24 hours) at 09/10/15 0741 Last data filed at 09/10/15 0700  Gross per 24 hour  Intake    408 ml  Output    850 ml  Net   -442 ml   Filed Weights   09/05/15 1309 09/09/15 1619 09/10/15 0427  Weight: 76.9 kg (169 lb 8.5 oz) 73.2 kg (161 lb 6 oz) 74 kg (163 lb 2.3 oz)    Examination:  General exam: appears calm and comfortable, no distress Respiratory system: diminished sounds but no wheezing  Cardiovascular system: S1 & S2 (+), rate controlled  Gastrointestinal system: (+) BS, non tender to palpation, not distended  Central nervous system: No focal deficits, awake, able to respond to simple questions, appears better than yesterday  Extremities: No edema, appreciate bilateral pulses  Skin: warm and dry skin  Psychiatry: normal mood  Data Reviewed: I have personally reviewed following labs and imaging studies  CBC:  Recent Labs Lab 09/03/15 1815   09/08/15 0533 09/08/15 1651 09/09/15 0656 09/09/15 1852 09/09/15 2303 09/10/15 0420  WBC 13.8*  < > 33.8*  --  33.3* 40.5* 43.6* 35.3*  NEUTROABS 10.4*  --   --   --   --   --   --   --   HGB 14.6  < > 10.7* 10.1* 9.1* 8.6* 8.1* 7.5*  HCT 43.7  < > 33.1* 30.5* 27.5* 25.6* 23.7* 22.2*  MCV 82.8  < > 83.4  --  81.4 81.0 82.6 81.9  PLT 351  < > 441*  --  481* 473* 500* 408*  < > = values in this interval not displayed. Basic Metabolic Panel:  Recent Labs Lab 09/04/15 0400 09/05/15 0732 09/06/15 0741 09/07/15 0556 09/08/15 0533 09/09/15 0656  NA 134* 132* 132* 130* 128* 133*  K 3.1* 4.1 4.9 4.8 4.7 4.3  CL 97* 99* 98* 96* 96* 100*  CO2 23 21* '25 24 25 23  '$ GLUCOSE 212* 170* 159* 152* 130* 158*  BUN 17 33* 44* 38* 71* 70*  CREATININE 1.30* 1.42* 1.21* 0.98 1.13* 1.33*  CALCIUM 9.7 9.6 9.3 9.1 8.5* 8.4*  MG 2.7* 2.4  --   --   --   --   PHOS 3.0  --   --   --   --   --    GFR: Estimated Creatinine Clearance: 31.9 mL/min (by C-G formula based on Cr of 1.33). Liver Function Tests:  Recent Labs Lab 09/03/15 1815 09/04/15 0400 09/05/15 0732  AST 29 28 43*  ALT 41 39 46  ALKPHOS 130* 119 104  BILITOT 0.6 0.4 0.4  PROT 7.4 7.2 7.0  ALBUMIN 4.2 3.9 3.8   No results for input(s): LIPASE, AMYLASE in the last 168 hours. No results for input(s): AMMONIA in the last 168 hours. Coagulation Profile:  Recent Labs Lab 09/09/15 1157  INR 1.16   Cardiac Enzymes: No results for input(s): CKTOTAL, CKMB, CKMBINDEX, TROPONINI in the last 168 hours. BNP (last 3 results) No results for input(s): PROBNP in the last 8760 hours. HbA1C: No results for input(s): HGBA1C in the last 72 hours. CBG:  Recent Labs Lab 09/08/15 2116 09/09/15 0741 09/09/15 1229 09/09/15 1728 09/09/15 2153  GLUCAP 132* 158* 138* 145* 104*   Lipid Profile: No results for input(s): CHOL, HDL, LDLCALC, TRIG, CHOLHDL, LDLDIRECT in the last 72 hours. Thyroid Function Tests: No results for input(s): TSH,  T4TOTAL, FREET4, T3FREE, THYROIDAB in the last 72 hours. Anemia Panel: No results for input(s): VITAMINB12, FOLATE, FERRITIN, TIBC, IRON, RETICCTPCT in the last 72 hours. Urine analysis:    Component Value Date/Time   COLORURINE YELLOW 09/10/2015 0251   APPEARANCEUR CLEAR 09/10/2015 0251   LABSPEC 1.021 09/10/2015 0251   PHURINE 5.5 09/10/2015 0251   GLUCOSEU NEGATIVE 09/10/2015 0251   HGBUR NEGATIVE 09/10/2015 0251   BILIRUBINUR NEGATIVE 09/10/2015 0251   KETONESUR NEGATIVE 09/10/2015 0251   PROTEINUR NEGATIVE 09/10/2015 0251   UROBILINOGEN 0.2 09/02/2007 1301   NITRITE NEGATIVE 09/10/2015 0251   LEUKOCYTESUR NEGATIVE 09/10/2015 0251   Sepsis Labs: '@LABRCNTIP'$ (procalcitonin:4,lacticidven:4)   Recent Results (from the past 240 hour(s))  MRSA PCR Screening     Status: None   Collection Time: 09/04/15  5:35 PM  Result Value Ref Range Status   MRSA by PCR NEGATIVE NEGATIVE Final      Radiology Studies:  Mr Brain Wo Contrast 09/08/2015 1.  No acute intracranial abnormality. 2. 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 3. Left middle cranial fossa arachnoid cyst, most likely congenital and inconsequential. 4. Acute on chronic appearing right maxillary sinusitis.   Dg Chest Port 1 View 09/08/2015  Mild patchy basilar right lower lobe opacity, which could represent atelectasis, aspiration or pneumonia. Recommend follow-up PA and lateral post treatment chest radiographs in 4-6 weeks.   Ct Angio Chest Pe W Or Wo Contrast 09/03/2015   No evidence of pulmonary embolism. No acute cardiopulmonary disease. Minimal atherosclerotic coronary artery disease.   Dg Chest Port 1 View 09/03/2015  Bilateral mid and lower lung heterogeneous opacities favored to represent atelectasis. Infection not excluded.   . sodium chloride   Intravenous Once  . amLODipine  10 mg Oral  Daily  . atorvastatin  20 mg Oral Daily  . guaiFENesin  600 mg Oral BID  . insulin aspart  0-15 Units Subcutaneous TID WC  .  ipratropium-albuterol  3 mL Nebulization TID  . loratadine  10 mg Oral Daily  . metoprolol tartrate  25 mg Oral BID  . montelukast  10 mg Oral QHS  . [START ON 09/13/2015] pantoprazole  40 mg Intravenous Q12H  . piperacillin-tazobactam (ZOSYN)  IV  3.375 g Intravenous Q8H  . saccharomyces boulardii  250 mg Oral BID  . sodium chloride flush  3 mL Intravenous Q12H     Continuous Infusions: . sodium chloride 50 mL/hr at 09/09/15 1253  . pantoprozole (PROTONIX) infusion 8 mg/hr (09/10/15 0645)     LOS: 7 days    Time spent: 25 minutes  Greater than 50% of the time spent on counseling and coordinating the care.   Leisa Lenz, MD [facility] Pager [phone]  If 7PM-7AM, please contact night-coverage [email or web address] Password [facility] 09/10/2015, 7:41 AM

## 2015-09-10 NOTE — Progress Notes (Deleted)
Patient was administered narcan per MD order at 2013 on 09/09/2015. Patient has remained lethargic, but slightly arousable to painful stimuli. Patient will occassionally respond to a single orientation question (A&O 0-1). Updated MD of patients status, wanted RN to continue to monitor due to patient being lethargic for past few days as well as VS being stable. RN also asked about HGB 8.1, MD ordered to transfuse 1 unit RBCs. RN will contact daughter for consent and continue to monitor patient.

## 2015-09-10 NOTE — Progress Notes (Signed)
Patient was administered narcan per MD order at 2013 on 09/09/2015. Patient has remained lethargic, but slightly arousable  to painful stimuli. Patient will occassionally respond to a single orientation question (A&O 0-1). Updated MD of patients status, wanted RN to continue to monitor due to patient being lethargic for past few days as well as VS being stable. RN also asked about HGB 8.1, MD ordered to transfuse 1 unit RBCs. RN will contact daughter for consent and continue to monitor patient.

## 2015-09-10 NOTE — Progress Notes (Signed)
RN called daughter Meliton Rattan) to get consent for blood administration for her mother, daughter agreed. RN also updated her on patients status throughout the night. Daughter stated she would be by later today.

## 2015-09-10 NOTE — Progress Notes (Signed)
Progress Note   Subjective  [name] is an 82 y/o Caucasian female who we consulted on yesterday regarding melena and IDA. Pt had EGD yesterday 09/09/15 with Dr. Hilarie Fredrickson with findings of LA Grade D reflux esophagitis, deformity in the pylorus, no gross lesions in the stomach, blood in the duodenal bulb and second part of the duodenum, multiple non-obstructing duodenal ulcers, 1 with visible vessel, injected and clipped.   This morning, the patient is found asleep, she does awaken, but falls straight back to sleep. Per nursing, pt had two very small melenic stools early this morning, but none since. She has had better neuro checks, but is still quite lethargic. She did receive one unit of PRBC's this morning. Labs are due at noon. She has not been awake enough to eat at all and is unable to take oral meds.   Objective   Vital signs in last 24 hours: Temp:  [97.8 F (36.6 C)-98.5 F (36.9 C)] 98.1 F (36.7 C) (06/24 0858) Pulse Rate:  [74-105] 98 (06/24 0858) Resp:  [14-28] 16 (06/24 0858) BP: (124-192)/(60-99) 145/62 mmHg (06/24 0858) SpO2:  [90 %-100 %] 94 % (06/24 0858) Weight:  [161 lb 6 oz (73.2 kg)-163 lb 2.3 oz (74 kg)] 163 lb 2.3 oz (74 kg) (06/24 0427) Last BM Date: 09/10/15 General: Caucasian female in NAD Heart:  Regular rate and rhythm; no murmurs Lungs: Respirations even and unlabored, wheezing b/l lung fields Abdomen:  Soft, mod tenderness in epigastrum (involuntary guarding) and nondistended. Normal bowel sounds. Extremities:  Without edema. Neurologic:  Unable to assess orientation as pt falls back asleep during my exam Psych:  Lethargic  Intake/Output from previous day: 06/23 0701 - 06/24 0700 In: 408 [P.O.:120; I.V.:238; IV Piggyback:50] Out: 850 [Urine:650; Stool:200] Intake/Output this shift: Total I/O In: 450 [I.V.:115; Blood:335] Out: -   Lab Results:  Recent Labs  09/09/15 1852 09/09/15 2303 09/10/15 0420  WBC 40.5* 43.6* 35.3*  HGB 8.6* 8.1*  7.5*  HCT 25.6* 23.7* 22.2*  PLT 473* 500* 408*   BMET  Recent Labs  09/08/15 0533 09/09/15 0656  NA 128* 133*  K 4.7 4.3  CL 96* 100*  CO2 25 23  GLUCOSE 130* 158*  BUN 71* 70*  CREATININE 1.13* 1.33*  CALCIUM 8.5* 8.4*   LFT No results for input(s): PROT, ALBUMIN, AST, ALT, ALKPHOS, BILITOT, BILIDIR, IBILI in the last 72 hours. PT/INR  Recent Labs  09/09/15 1157  LABPROT 15.0  INR 1.16    Studies/Results: Mr Brain Wo Contrast  09/08/2015  CLINICAL DATA:  82 year old female with increasing shortness of breath and lethargy. Initial encounter. EXAM: MRI HEAD WITHOUT CONTRAST TECHNIQUE: Multiplanar, multiecho pulse sequences of the brain and surrounding structures were obtained without intravenous contrast. COMPARISON:  None. FINDINGS: Mildly heterogeneous diffusion weighted imaging, including in the brainstem (series 3, image 21), but no convincing No restricted diffusion or evidence of acute infarction. Major intracranial vascular flow voids are preserved, with generalized intracranial artery dolichoectasia. 3.7 cm CSF isointense simple appearing fluid collection in the left middle cranial fossa most compatible with arachnoid cyst (series 7, image 9 and series 5, image 18). No other extra-axial collection. No other intracranial mass effect. No midline shift. No ventriculomegaly, or acute intracranial hemorrhage. Cervicomedullary junction and pituitary are within normal limits. Negative for age visualized cervical spine. Patchy and confluent cerebral white matter T2 and FLAIR hyperintensity, including involvement of the deep white matter capsules. T2 heterogeneity in the deep gray matter nuclei which appears  mostly related to perivascular spaces. Patchy T2 hyperintensity in the pons. Mild increased perivascular spaces in the deep cerebellar nuclei. No cortical encephalomalacia or chronic cerebral blood products. Visible internal auditory structures appear normal. Mastoids are clear.  There is a fluid level in the right maxillary sinus. Suggestion of superimposed right maxillary sinus mucous retention cysts as well as chronic periostitis. There is otherwise mild ethmoid and right frontal sinus mucosal thickening. Negative orbit and scalp soft tissues. Visualized bone marrow signal is within normal limits. IMPRESSION: 1.  No acute intracranial abnormality. 2. 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 3. Left middle cranial fossa arachnoid cyst, most likely congenital and inconsequential. 4. Acute on chronic appearing right maxillary sinusitis. Electronically Signed   By: Genevie Ann M.D.   On: 09/08/2015 20:49   Dg Chest Port 1 View  09/08/2015  CLINICAL DATA:  Pneumonia. EXAM: PORTABLE CHEST 1 VIEW COMPARISON:  09/03/2015 chest radiograph. FINDINGS: Stable cardiomediastinal silhouette with normal heart size. No pneumothorax. No pleural effusion. Mild patchy opacity in the basilar right lower lobe. No pulmonary edema . IMPRESSION: Mild patchy basilar right lower lobe opacity, which could represent atelectasis, aspiration or pneumonia. Recommend follow-up PA and lateral post treatment chest radiographs in 4-6 weeks. Electronically Signed   By: Ilona Sorrel M.D.   On: 09/08/2015 13:16       Assessment / Plan:   Impression: 1. Acute blood loss anemia-hgb trended downward over past 3 days, EGD on 09/09/15 with bleeding dudoenal ulcers-clipped and injected-pt received one unit PRBC's this morning, hgb will be drawn around 12 per nursing, 2 very small melenic stools early this morning, none since 2. Leukocytosis-now trending downward on empiric abx-source unclear 3. Diarrhea-melenic stool x2 early this morning, before 7am, none since 4. Acute respiratory failure- improved; thought related to acute asthma exacerbation: hospitalist following 5. Acute metabolic  encephalopathy- hospitalist following  Plan: 1. Continue supportive measures 2. Continue IV Pantoprazole infusion for 72 hrs total, started 6/23 afternoon. 3. Continue clear liquid diet 4. Continue to monitor labs, transfuse as necessary 5. Will discuss above with Dr. Hilarie Fredrickson, please await any further recs  Thank you for your kind consultation, we will continue to follow.  Active Problems:   CKD (chronic kidney disease) stage 3, GFR 30-59 ml/min   Hypoxemia   Hypokalemia   Asthma exacerbation   Melena   Acute blood loss anemia   Duodenal ulcer hemorrhage     LOS: 7 days   Levin Erp  09/10/2015, 9:14 AM  Pager # [phone]

## 2015-09-10 NOTE — Evaluation (Signed)
Clinical/Bedside Swallow Evaluation Patient Details  Name: [name] MRN: [id number] Date of Birth: 11-15-1932  Today's Date: 09/10/2015 Time: SLP Start Time (ACUTE ONLY): 37 SLP Stop Time (ACUTE ONLY): 0940 SLP Time Calculation (min) (ACUTE ONLY): 20 min  Past Medical History:  Past Medical History  Diagnosis Date  . Hypercholesterolemia   . Hypertension   . Mitral valve prolapse   . Mild acid reflux   . Staph infection      history of a wound infection following a previous right hip surgery  . CKD (chronic kidney disease) stage 3, GFR 30-59 ml/min   . Spinal stenosis   . Renal insufficiency    Past Surgical History:  Past Surgical History  Procedure Laterality Date  . Right breast lumpectomy    . Left total hip replacement arthroplasty  June of 2001  . Right total hip replacement arthroplasty  August of 2001  . Abcess drainage    . Colonoscopy  04/30/01    [id number] hemorrhoids, otherwise, normal rectum/colon/Normal terminal ileum  . Esophagogastroduodenoscopy    03/01/2007    [id number] esophagus without evidence of Barrett's, mass/Mild erythema in the antrum.  Distorted pylorus consistent with prior ulcer disease.  Biopsies obtained/ 3. Junction of D1 and D2 erythematous, edematous, and strictured.  The lumen was narrowed to approximately 10 mm.   . Colonoscopy  03/02/2007    [id number] sigmoid colon diverticula.  Otherwise no polyps, masses/Normal terminal ileum.  Approximately 10-15 cm of the distal terminal ileum visualized/normal view of the rectum  . Total knee arthroplasty    . Abdominal hysterectomy     HPI:  Patient is a 82 y.o. female with medical history significant of CKD , HTN spinal stenosis,  GERD, HL, Asthma presenting with 3-day h/o dyspnea wheezing with gradually worsening, found to be hypoxic at Urgent Care and sent to ER.   Assessment / Plan / Recommendation Clinical Impression  Patient presents with a severe oral and mod pharyngeal dysphagia  characterized by swallow initiation delays, poor awareness to and ability to manipulate and move bolus in oral cavity, suspected delayed swallow initation. Patient did not exhibit any overt s/s of aspiration however, and laryngeal elevation was adequate per palpation. Patient's current level of lethargy and fatigue are definitely impacting her ability to effectively manage solid texture PO's. Currently, she is on a clear liquid diet secondary to GI MD recommendations and once she is cleared for starting solid diet by GI, will need SLP to assess her toleration of solids prior to upgrading her.    Aspiration Risk  Moderate aspiration risk    Diet Recommendation Thin liquid   Liquid Administration via: Straw;Cup Medication Administration: Crushed with puree Supervision: Full supervision/cueing for compensatory strategies;Staff to assist with self feeding Compensations: Minimize environmental distractions;Slow rate;Small sips/bites Postural Changes: Seated upright at 90 degrees    Other  Recommendations Oral Care Recommendations: Oral care BID   Follow up Recommendations  Skilled Nursing facility;24 hour supervision/assistance    Frequency and Duration            Prognosis        Swallow Study   General Date of Onset: 09/10/15 HPI: Patient is a 82 y.o. female with medical history significant of CKD , HTN spinal stenosis,  GERD, HL, Asthma presenting with 3-day h/o dyspnea wheezing with gradually worsening, found to be hypoxic at Urgent Care and sent to ER. Type of Study: Bedside Swallow Evaluation Previous Swallow Assessment: N/a Diet Prior to this Study: Thin liquids (  clear liquids per GI) Temperature Spikes Noted: No Respiratory Status: Room air History of Recent Intubation: No Behavior/Cognition: Cooperative;Lethargic/Drowsy;Requires cueing Oral Cavity Assessment: Within Functional Limits Oral Care Completed by SLP: No Oral Cavity - Dentition: Edentulous;Missing  dentition Self-Feeding Abilities: Total assist Patient Positioning: Upright in bed Baseline Vocal Quality: Low vocal intensity Volitional Cough: Cognitively unable to elicit Volitional Swallow: Unable to elicit    Oral/Motor/Sensory Function Overall Oral Motor/Sensory Function: Generalized oral weakness Facial ROM: Reduced right;Reduced left Facial Symmetry: Within Functional Limits Facial Strength: Reduced right;Reduced left Lingual ROM: Reduced right;Reduced left Lingual Strength: Reduced   Ice Chips Ice chips: Not tested   Thin Liquid Thin Liquid: Impaired Presentation: Straw Oral Phase Functional Implications: Prolonged oral transit Pharyngeal  Phase Impairments: Suspected delayed Swallow    Nectar Thick     Honey Thick     Puree Puree: Impaired Presentation: Spoon Oral Phase Impairments: Poor awareness of bolus Oral Phase Functional Implications: Prolonged oral transit Pharyngeal Phase Impairments: Suspected delayed Swallow   Solid   GO   Solid: Not tested        Nadara Mode Tarrell 09/10/2015,11:53 AM    Sonia Baller, MA, CCC-SLP 09/10/2015 11:53 AM

## 2015-09-10 NOTE — Progress Notes (Signed)
Updated pt's daughter Joelene Millin on the phone, she stated she would be here this afternoon. Consuelo Pandy RN

## 2015-09-10 NOTE — Progress Notes (Signed)
Updated patients daughter (HPOA) on mother's status. Informed her that patient was lethargic, narcan was given, Rapid response had seen patient and MD was notified. Told daughter I would continue to update her as necessary. She stated she would be by in the morning.

## 2015-09-10 NOTE — Progress Notes (Deleted)
Updated patients daughter (HPOA) on mother's status. Informed her that patient was lethargic, narcan was given, Rapid response had seen patient and MD was notified. Told daughter I would continue to update her as necessary. She stated she would be by in the morning.

## 2015-09-10 NOTE — Progress Notes (Signed)
Pt given medications crushed in applesauce with speech therapist John at bedside. Consuelo Pandy RN

## 2015-09-11 ENCOUNTER — Encounter (HOSPITAL_COMMUNITY): Payer: Self-pay | Admitting: Internal Medicine

## 2015-09-11 LAB — GLUCOSE, CAPILLARY
GLUCOSE-CAPILLARY: 147 mg/dL — AB (ref 65–99)
Glucose-Capillary: 125 mg/dL — ABNORMAL HIGH (ref 65–99)
Glucose-Capillary: 157 mg/dL — ABNORMAL HIGH (ref 65–99)
Glucose-Capillary: 119 mg/dL — ABNORMAL HIGH (ref 65–99)

## 2015-09-11 LAB — BASIC METABOLIC PANEL
Anion gap: 8 (ref 5–15)
BUN: 26 mg/dL — AB (ref 6–20)
CO2: 23 mmol/L (ref 22–32)
CREATININE: 1.39 mg/dL — AB (ref 0.44–1.00)
Calcium: 8.2 mg/dL — ABNORMAL LOW (ref 8.9–10.3)
Chloride: 112 mmol/L — ABNORMAL HIGH (ref 101–111)
GFR calc Af Amer: 40 mL/min — ABNORMAL LOW (ref >60)
GFR, EST NON AFRICAN AMERICAN: 35 mL/min — AB (ref >60)
GLUCOSE: 127 mg/dL — AB (ref 65–99)
POTASSIUM: 3.5 mmol/L (ref 3.5–5.1)
Sodium: 143 mmol/L (ref 135–145)

## 2015-09-11 LAB — CBC
HCT: 25.8 % — ABNORMAL LOW (ref 36.0–46.0)
HEMATOCRIT: 24.4 % — AB (ref 36.0–46.0)
HEMOGLOBIN: 8 g/dL — AB (ref 12.0–15.0)
Hemoglobin: 8.4 g/dL — ABNORMAL LOW (ref 12.0–15.0)
MCH: 27.6 pg (ref 26.0–34.0)
MCH: 27.9 pg (ref 26.0–34.0)
MCHC: 32.6 g/dL (ref 30.0–36.0)
MCHC: 32.8 g/dL (ref 30.0–36.0)
MCV: 84.9 fL (ref 78.0–100.0)
MCV: 85 fL (ref 78.0–100.0)
PLATELETS: 400 10*3/uL (ref 150–400)
Platelets: 447 10*3/uL — ABNORMAL HIGH (ref 150–400)
RBC: 3.04 MIL/uL — AB (ref 3.87–5.11)
RBC: 2.87 MIL/uL — ABNORMAL LOW (ref 3.87–5.11)
RDW: 15.4 % (ref 11.5–15.5)
RDW: 15.5 % (ref 11.5–15.5)
WBC: 29.5 10*3/uL — AB (ref 4.0–10.5)
WBC: 33.1 10*3/uL — AB (ref 4.0–10.5)

## 2015-09-11 NOTE — Progress Notes (Addendum)
Patient ID: [name], female   DOB: 1932/10/11, 82 y.o.   MRN: [id number]  PROGRESS NOTE    [name]  [id number] DOB: 05/27/1932 DOA: 09/03/2015  PCP: Odette Fraction, MD   Brief Narrative:  82 y.o. female with past medical history significant for hypertension, spinal stenosis, dyslipidemia, asthma. Patient presented to [facility] with worsening shortness of breath over past 3 days prior to this admission. She used at home inhalers with no significant symptomatic relief.  She initially presented to urgent care and found to be hypoxic around 80s and she was referred to emergency department for further evaluation. She was admitted for management of asthma exacerbation.  More lethargic on 6/22 afternoon, stable vitals. MRI with no acute intracranial findings.  Had few episodes of tarry stool 6/22 and GI did EGD 6/23 with findings indicated below. She was transferred to SDU 09/09/2015.   Assessment & Plan:  Acute blood loss anemia / Acute upper GI bleed secondary to duodenal bulb hemorrhage / Multiple duodenal ulcers one with visible vessel - Concern for upper GI bleed, has blood and tarry stools - EGD 09/09/2015 - LA Grade D reflux esophagitis. No gross lesions in the stomach. Blood in the duodenal bulb and in the second portion of the duodenum. Multiple non-obstructing duodenal ulcers, 1 with a visible vessel.  - Pt is status post status post injection and Endo Clipping of ulcer with visible vessel - She was started on Pepcid IV Q 12 hours 6/23 but after EGD started on protonix drip - hemoglobin is 8.4 this am but she did get 1 U PRBC 6/24 - Appreciate GI following  - Diet - thin liquids   Leukocytosis / Sepsis, unspecified organism / Possible aspiration pneumonitis  - Initially thought to be secondary to steroids. Steroids stopped 09/07/2015. Prior to that she has gotten prednisone 50 mg 6/21 and prior to that she was on IV solumedrol 60 mg Q 12 hours. - SIRS  criteria met on admission.  - Sepsis criteria met on 6/22. Sepsis protocol utilized. Lactic acid WNL, procalcitonin mildly elevated at 0.26. WBC count up to 43.6. RR was 14-20 and HR 74-105 - Unclear source of infection. CXR with possible pneumonia and she was started on zosyn 6/23. She was also given PO vanco 6/23 for possible C.diff colitis  - Blood cultures pending  - Leukocytosis is improving  - Per SLP study - thin liquids   Acute respiratory failure with hypoxia /Acute Asthma exacerbation  - Stable respirator status - CXR 6/22 showed mild patchy basilar lower lobe opacity (atelectasis or aspiration or pneumonia) - Continue duoneb TID scheduled and every 4 hours as needed for shortness of breath or wheezing   Acute metabolic encephalopathy - Unclear etiology - MRI with no acute intracranial findings - her mental status improved since 6/23  Accelerated hypertension / Essential hypertension - Continue Norvasc and metoprolol   Chronic kidney disease stage III - Baseline creatinine 1.4 about 10 months ago - Creatinine on this admission 1.09 and up to 1.4  - Follow up BMP today   Hypokalemia - Likely secondary to nebulizer treatments - Potassium subsequently within normal limits - BMP pending this am  Depression - Discontinue celexa 6/23 due to lethargy - Stable mental status today  - Continue to hold celexa  Dyslipidemia - Continue Lipitor 20 mg daily   Steroid induced hyperglycemia - A1c is 5.6 - Continue sliding scale insulin  Recent falls - PT evaluation - HHPT, orders placed  DVT prophylaxis: SCD's bilaterally  Code Status: full code  Family Communication: called daughter [name] over the phone to give update  Disposition Plan: Not stable for discharge at this time. Continue to monitor in SDU  Consultants:   Physical therapy   Pulmonary - respiratory team   Gastroenterology - Velora Heckler   Procedures:   EGD by Dr. Hilarie Fredrickson 09/09/2015 - LA Grade D reflux  esophagitis. No gross lesions in the stomach. Blood in the duodenal bulb and in the second portion of the duodenum. Multiple non-obstructing duodenal ulcers, 1 with a visible vessel. Injected.    Protonix infusion 6/23 -->  1 U PRBC 09/10/2015   Antimicrobials:   Vanco PO 09/09/2015 --> 09/10/2015   Levaquin 09/09/2015 1 dose  Zosyn 09/09/2015 -->   Subjective: No overnight events.   Objective: Filed Vitals:   09/10/15 2000 09/10/15 2045 09/11/15 0001 09/11/15 0436  BP: 158/66  157/68 163/62  Pulse: 104  103 102  Temp: 98.4 F (36.9 C)  98.3 F (36.8 C) 98.4 F (36.9 C)  TempSrc: Oral  Oral Oral  Resp: '15  14 16  '$ Height:      Weight:      SpO2: 97% 97% 98% 97%    Intake/Output Summary (Last 24 hours) at 09/11/15 0703 Last data filed at 09/11/15 0604  Gross per 24 hour  Intake 3440.83 ml  Output    300 ml  Net 3140.83 ml   Filed Weights   09/05/15 1309 09/09/15 1619 09/10/15 0427  Weight: 76.9 kg (169 lb 8.5 oz) 73.2 kg (161 lb 6 oz) 74 kg (163 lb 2.3 oz)    Examination:  General exam: no acute distress  Respiratory system: bilateral air entry, no wheezing  Cardiovascular system: S1 & S2 (+), RRR Gastrointestinal system: (+) BS, no distention, no tenderness  Central nervous system: non focal, sleepy but arousable  Extremities: No edema, pulses bilaterally  Skin: no lesions or ulcers  Psychiatry: no agitation or restlessness   Data Reviewed: I have personally reviewed following labs and imaging studies  CBC:  Recent Labs Lab 09/09/15 1852 09/09/15 2303 09/10/15 0420 09/10/15 1650 09/11/15 0508  WBC 40.5* 43.6* 35.3* 31.2* 29.5*  HGB 8.6* 8.1* 7.5* 8.7* 8.4*  HCT 25.6* 23.7* 22.2* 26.2* 25.8*  MCV 81.0 82.6 81.9 85.6 84.9  PLT 473* 500* 408* 397 248   Basic Metabolic Panel:  Recent Labs Lab 09/05/15 0732 09/06/15 0741 09/07/15 0556 09/08/15 0533 09/09/15 0656 09/10/15 1419  NA 132* 132* 130* 128* 133* 141  K 4.1 4.9 4.8 4.7 4.3 3.7  CL 99*  98* 96* 96* 100* 110  CO2 21* '25 24 25 23 24  '$ GLUCOSE 170* 159* 152* 130* 158* 118*  BUN 33* 44* 38* 71* 70* 50*  CREATININE 1.42* 1.21* 0.98 1.13* 1.33* 1.48*  CALCIUM 9.6 9.3 9.1 8.5* 8.4* 8.3*  MG 2.4  --   --   --   --  2.4   GFR: Estimated Creatinine Clearance: 28.7 mL/min (by C-G formula based on Cr of 1.48). Liver Function Tests:  Recent Labs Lab 09/05/15 0732  AST 43*  ALT 46  ALKPHOS 104  BILITOT 0.4  PROT 7.0  ALBUMIN 3.8   No results for input(s): LIPASE, AMYLASE in the last 168 hours. No results for input(s): AMMONIA in the last 168 hours. Coagulation Profile:  Recent Labs Lab 09/09/15 1157  INR 1.16   Cardiac Enzymes: No results for input(s): CKTOTAL, CKMB, CKMBINDEX, TROPONINI in the last 168 hours. BNP (  last 3 results) No results for input(s): PROBNP in the last 8760 hours. HbA1C: No results for input(s): HGBA1C in the last 72 hours. CBG:  Recent Labs Lab 09/09/15 2153 09/10/15 0748 09/10/15 1130 09/10/15 1657 09/10/15 2155  GLUCAP 104* 128* 163* 109* 118*   Lipid Profile: No results for input(s): CHOL, HDL, LDLCALC, TRIG, CHOLHDL, LDLDIRECT in the last 72 hours. Thyroid Function Tests: No results for input(s): TSH, T4TOTAL, FREET4, T3FREE, THYROIDAB in the last 72 hours. Anemia Panel: No results for input(s): VITAMINB12, FOLATE, FERRITIN, TIBC, IRON, RETICCTPCT in the last 72 hours. Urine analysis:    Component Value Date/Time   COLORURINE YELLOW 09/10/2015 0251   APPEARANCEUR CLEAR 09/10/2015 0251   LABSPEC 1.021 09/10/2015 0251   PHURINE 5.5 09/10/2015 0251   GLUCOSEU NEGATIVE 09/10/2015 0251   HGBUR NEGATIVE 09/10/2015 0251   BILIRUBINUR NEGATIVE 09/10/2015 0251   KETONESUR NEGATIVE 09/10/2015 0251   PROTEINUR NEGATIVE 09/10/2015 0251   UROBILINOGEN 0.2 09/02/2007 1301   NITRITE NEGATIVE 09/10/2015 0251   LEUKOCYTESUR NEGATIVE 09/10/2015 0251   Sepsis Labs: '@LABRCNTIP'$ (procalcitonin:4,lacticidven:4)   Recent Results (from the  past 240 hour(s))  MRSA PCR Screening     Status: None   Collection Time: 09/04/15  5:35 PM  Result Value Ref Range Status   MRSA by PCR NEGATIVE NEGATIVE Final      Radiology Studies:  Mr Brain Wo Contrast 09/08/2015 1.  No acute intracranial abnormality. 2. 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 3. Left middle cranial fossa arachnoid cyst, most likely congenital and inconsequential. 4. Acute on chronic appearing right maxillary sinusitis.   Dg Chest Port 1 View 09/08/2015  Mild patchy basilar right lower lobe opacity, which could represent atelectasis, aspiration or pneumonia. Recommend follow-up PA and lateral post treatment chest radiographs in 4-6 weeks.   Ct Angio Chest Pe W Or Wo Contrast 09/03/2015   No evidence of pulmonary embolism. No acute cardiopulmonary disease. Minimal atherosclerotic coronary artery disease.   Dg Chest Port 1 View 09/03/2015  Bilateral mid and lower lung heterogeneous opacities favored to represent atelectasis. Infection not excluded.   . sodium chloride   Intravenous Once  . amLODipine  10 mg Oral Daily  . atorvastatin  20 mg Oral Daily  . guaiFENesin  600 mg Oral BID  . insulin aspart  0-15 Units Subcutaneous TID WC  . ipratropium-albuterol  3 mL Nebulization TID  . loratadine  10 mg Oral Daily  . metoprolol tartrate  25 mg Oral BID  . montelukast  10 mg Oral QHS  . [START ON 09/13/2015] pantoprazole  40 mg Intravenous Q12H  . piperacillin-tazobactam (ZOSYN)  IV  3.375 g Intravenous Q8H  . saccharomyces boulardii  250 mg Oral BID  . sodium chloride flush  3 mL Intravenous Q12H     Continuous Infusions: . sodium chloride 50 mL/hr at 09/11/15 0500  . pantoprozole (PROTONIX) infusion 8 mg/hr (09/11/15 0500)     LOS: 8 days    Time spent: 25 minutes  Greater than 50% of the time spent on counseling and coordinating  the care.   Leisa Lenz, MD [facility] Pager [phone]  If 7PM-7AM, please contact night-coverage [email or web address] Password [email or web address] 09/11/2015, 7:03 AM

## 2015-09-11 NOTE — Progress Notes (Signed)
Progress Note   Subjective  [name] is an 82 year old Caucasian female who we consult did on 09/09/15 regarding melena and iron deficiency anemia. Patient did have EGD on 09/09/15 with injection and clipping of duodenal ulcers.  This morning, the patient is sitting up and alert at the time of my interview, this is an improvement from previous exams. She describes that she continues to feel weak and "somewhat sick", but overall this is slightly improved from when she was admitted. The patient continues some ongoing abdominal pain, this is decreased from previous. Per nursing, the patient has had no further melenic stools since 09/10/15 early in the morning. She is greatly improved from their standpoint.   Objective   Vital signs in last 24 hours: Temp:  [97.5 F (36.4 C)-99.1 F (37.3 C)] 97.5 F (36.4 C) (06/25 1111) Pulse Rate:  [81-104] 81 (06/25 1111) Resp:  [14-19] 14 (06/25 1111) BP: (135-163)/(62-103) 150/75 mmHg (06/25 1111) SpO2:  [93 %-98 %] 98 % (06/25 1111) Last BM Date: 09/10/15 General: Caucasian female in NAD Heart:  Regular rate and rhythm; no murmurs Lungs: Respirations even and unlabored, lungs CTA bilaterally Abdomen:  Soft, mild TTP in epigastrum and nondistended. Normal bowel sounds. Extremities:  Without edema. Neurologic:  Alert and oriented,  grossly normal neurologically. Psych:  Cooperative. Normal mood and affect.  Intake/Output from previous day: 06/24 0701 - 06/25 0700 In: 3440.8 [P.O.:360; I.V.:2645.8; Blood:335; IV Piggyback:100] Out: 300 [Urine:300] Intake/Output this shift: Total I/O In: 615 [P.O.:240; I.V.:375] Out: -   Lab Results:  Recent Labs  09/10/15 0420 09/10/15 1650 09/11/15 0508  WBC 35.3* 31.2* 29.5*  HGB 7.5* 8.7* 8.4*  HCT 22.2* 26.2* 25.8*  PLT 408* 397 400   BMET  Recent Labs  09/09/15 0656 09/10/15 1419 09/11/15 0823  NA 133* 141 143  K 4.3 3.7 3.5  CL 100* 110 112*  CO2 23 24 23   GLUCOSE 158* 118* 127*   BUN 70* 50* 26*  CREATININE 1.33* 1.48* 1.39*  CALCIUM 8.4* 8.3* 8.2*   LFT No results for input(s): PROT, ALBUMIN, AST, ALT, ALKPHOS, BILITOT, BILIDIR, IBILI in the last 72 hours. PT/INR  Recent Labs  09/09/15 1157  LABPROT 15.0  INR 1.16    Studies/Results: No results found.     Assessment / Plan:   Impression: 1. Acute blood loss anemia-hgb trended downward for 3 days, EGD on 09/09/15 with bleeding dudoenal ulcers-clipped and injected-pt received one unit PRBC's 09/10/15, since then hemoglobin has remained stable, 8.7 on the morning of 6/24 and 8.4 this morning and no further melena per nursing 2. Leukocytosis-now trending downward on empiric abx-source unclear 3. Diarrhea-No further stools since morning of 09/10/15 4. Acute respiratory failure- improved; thought related to acute asthma exacerbation: hospitalist following 5. Acute metabolic encephalopathy- hospitalist following  Plan: 1. Continue supportive measures 2. Patient will need to remain on Protonix drip for a total of 72 hours, then would recommend twice a day PPI 3. Continue to monitor labs, transfuse as necessary 4. Advance diet as tolerated 5. Will discuss above with Dr. Hilarie Fredrickson, please await any further recommendations  Thank you for your kind consultation, we will likely sign off.  Active Problems:   CKD (chronic kidney disease) stage 3, GFR 30-59 ml/min   Hypoxemia   Hypokalemia   Asthma exacerbation   Melena   Acute blood loss anemia   Duodenal ulcer hemorrhage   Leukocytosis     LOS: 8 days   Levin Erp  09/11/2015, 11:15 AM  Pager # [phone]

## 2015-09-12 ENCOUNTER — Inpatient Hospital Stay (HOSPITAL_COMMUNITY): Payer: Medicare Other

## 2015-09-12 LAB — CBC
HCT: 23.1 % — ABNORMAL LOW (ref 36.0–46.0)
HEMOGLOBIN: 7.5 g/dL — AB (ref 12.0–15.0)
MCH: 27.8 pg (ref 26.0–34.0)
MCHC: 32.5 g/dL (ref 30.0–36.0)
MCV: 85.6 fL (ref 78.0–100.0)
PLATELETS: 413 10*3/uL — AB (ref 150–400)
RBC: 2.7 MIL/uL — AB (ref 3.87–5.11)
RDW: 15.5 % (ref 11.5–15.5)
WBC: 29.7 10*3/uL — AB (ref 4.0–10.5)

## 2015-09-12 LAB — GLUCOSE, CAPILLARY
GLUCOSE-CAPILLARY: 153 mg/dL — AB (ref 65–99)
GLUCOSE-CAPILLARY: 114 mg/dL — AB (ref 65–99)
GLUCOSE-CAPILLARY: 125 mg/dL — AB (ref 65–99)
Glucose-Capillary: 136 mg/dL — ABNORMAL HIGH (ref 65–99)

## 2015-09-12 LAB — HEMOGLOBIN AND HEMATOCRIT, BLOOD
HEMATOCRIT: 28.1 % — AB (ref 36.0–46.0)
HEMOGLOBIN: 9.3 g/dL — AB (ref 12.0–15.0)

## 2015-09-12 LAB — PREPARE RBC (CROSSMATCH)

## 2015-09-12 LAB — H. PYLORI ANTIBODY, IGG: H Pylori IgG: <0.9 U/mL (ref 0.0–0.8)

## 2015-09-12 MED ORDER — EPINEPHRINE HCL 0.1 MG/ML IJ SOSY
PREFILLED_SYRINGE | INTRAMUSCULAR | Status: AC
  Filled 2015-09-12: qty 10

## 2015-09-12 MED ORDER — SODIUM CHLORIDE 0.9 % IV SOLN
Freq: Once | INTRAVENOUS | Status: AC
  Administered 2015-09-12: 12:00:00 via INTRAVENOUS

## 2015-09-12 NOTE — Progress Notes (Signed)
Pharmacy Antibiotic Note  [name] is a 82 y.o. female admitted on 09/03/2015 with dyspnea and was started on Levaquin for asthma/PNA.  Patient was transitioned to Zosyn for possible colonic ischemia.  Patient has CKD and her renal function has been stable.   Plan: - Continue Zosyn 3.375gm IV Q8H, 4 hr infusion - Monitor renal fxn, clinical progress - F/U H. Pylori Ag, switching PPI to PO as soon as possible d/t national shortage - F/U abx LOT   Height: 5\' 3"  (160 cm) Weight: 163 lb 2.3 oz (74 kg) IBW/kg (Calculated) : 52.4  Temp (24hrs), Avg:98.3 F (36.8 C), Min:97.5 F (36.4 C), Max:98.7 F (37.1 C)   Recent Labs Lab 09/07/15 0556 09/08/15 0533 09/09/15 0656 09/09/15 1038 09/09/15 1157  09/10/15 0420 09/10/15 1419 09/10/15 1650 09/11/15 0508 09/11/15 0823 09/11/15 1704 09/12/15 0508  WBC 23.8* 33.8* 33.3*  --   --   < > 35.3*  --  31.2* 29.5*  --  33.1* 29.7*  CREATININE 0.98 1.13* 1.33*  --   --   --   --  1.48*  --   --  1.39*  --   --   LATICACIDVEN  --   --   --  1.7 1.6  --   --   --   --   --   --   --   --   < > = values in this interval not displayed.  Estimated Creatinine Clearance: 30.6 mL/min (by C-G formula based on Cr of 1.39).    Allergies  Allergen Reactions  . Demerol [Meperidine] Other (See Comments)    Altered mental status  . Meperidine Hcl Other (See Comments)    Altered mental status    Antimicrobials this admission: LVQ 6/23 >> 6/23 Zosyn 6/23 >> PO Vanc 6/23 >> 6/24; no doses given  Dose adjustments this admission: N/A  Microbiology results: 6/24 H. Pylori: pending 6/23 CDiff - negative 6/23 BCx - NGTD 6/18 MRSA screen: negative   [name] D. Mina Marble, PharmD, BCPS Pager:  [phone] 09/12/2015, 11:12 AM

## 2015-09-12 NOTE — Progress Notes (Addendum)
Patient ID: [name], female   DOB: May 27, 1932, 82 y.o.   MRN: [id number]  PROGRESS NOTE    [name]  WER:[id number] DOB: [birth year] DOA: 09/03/2015  PCP: Odette Fraction, MD   Brief Narrative:  82 y.o. female with past medical history significant for hypertension, spinal stenosis, dyslipidemia, asthma. Patient presented to [facility] with worsening shortness of breath over past 3 days prior to this admission. She used at home inhalers with no significant symptomatic relief.  She initially presented to urgent care and found to be hypoxic around 80s and she was referred to emergency department for further evaluation. She was admitted for management of asthma exacerbation.  More lethargic on 6/22 afternoon, stable vitals. MRI with no acute intracranial findings.  Had few episodes of tarry stool 6/22 and GI did EGD 6/23 with findings indicated below. She was transferred to SDU 09/09/2015.   Assessment & Plan:  Acute blood loss anemia / Acute upper GI bleed secondary to duodenal bulb hemorrhage / Multiple duodenal ulcers one with visible vessel - Concern for upper GI bleed, has had blood and tarry stools - EGD 09/09/2015 - LA Grade D reflux esophagitis. No gross lesions in the stomach. Blood in the duodenal bulb and in the second portion of the duodenum. Multiple non-obstructing duodenal ulcers, 1 with a visible vessel.  - Pt is status post status post injection and Endo Clipping of ulcer with visible vessel - She was started on Pepcid IV Q 12 hours 6/23 but after EGD started on protonix drip 09/09/2015 which she will continue for a total of 72 hours. She is also on Protonix 40 mg IV every 12 hours. Once Protonix drip is stopped and then she will resume Protonix 40 mg twice daily for 8 weeks.  - We also need to follow up on H.pylori testing  - She has received here RBC transfusion June 24. Hemoglobin this morning 7.5. We'll give another units of PRBC transfusion today but  concern for ongoing bleed so we will obtain CT abdomen pelvis without contrast. This has to be done without contrast because of renal insufficiency - GI has signed off 09/12/2015 - Diet - thin liquids, meds with pure  Leukocytosis / Sepsis, unspecified organism / Possible aspiration pneumonitis  - Initially thought to be secondary to steroids. Steroids stopped 09/07/2015. Prior to that she has gotten prednisone 50 mg 6/21 and prior to that she was on IV solumedrol 60 mg Q 12 hours. - SIRS criteria met on admission.  - Sepsis criteria met on 6/22. Sepsis protocol utilized. Lactic acid WNL, procalcitonin mildly elevated at 0.26. WBC count up to 43.6. RR was 14-20 and HR 74-105 - Unclear source of infection. CXR with possible pneumonia and she was started on zosyn 6/23. She was also given PO vanco 6/23 for possible C.diff colitis but will stopped 09/10/2015 because C. difficile PCR is negative - Blood cultures so far show no growth - She is currently on Zosyn only - Leukocytosis is improving  - Per SLP study - thin liquids, meds with pure  Acute respiratory failure with hypoxia /Acute Asthma exacerbation  - Stable respirator status - CXR 6/22 showed mild patchy basilar lower lobe opacity (atelectasis or aspiration or pneumonia) - Continue DuoNeb on an as-needed basis  Acute metabolic encephalopathy - Unclear etiology - MRI with no acute intracranial findings - Stable mental status, patient is alert, oriented to time, place and person  Accelerated hypertension / Essential hypertension - Continue Norvasc and metoprolol  Chronic kidney disease stage III - Baseline creatinine 1.4 about 10 months ago - Creatinine on this admission 1.09 and up to 1.4  - BMP pending today   Hypokalemia - Likely secondary to nebulizer treatments - Potassium subsequently within normal limits - BMP pending  Depression - Discontinue celexa 6/23 due to lethargy - Continue to hold celexa - Stable    Dyslipidemia - Continue Lipitor 20 mg daily   Steroid induced hyperglycemia - A1c is 5.6 - Continue sliding scale insulin - CBGs in past 24 hours: 119, 147, 153  Recent falls - PT evaluation - HHPT, orders placed but this may change considering prolonged hospitalization    DVT prophylaxis: SCD's bilaterally  Code Status: full code  Family Communication: called daughter Joelene Millin over the phone to give update on daily basis  Disposition Plan: Not stable for discharge at this time. Continue to monitor in SDU  Consultants:   Physical therapy   Pulmonary - respiratory team   Gastroenterology - Velora Heckler   Procedures:   EGD by Dr. Hilarie Fredrickson 09/09/2015 - LA Grade D reflux esophagitis. No gross lesions in the stomach. Blood in the duodenal bulb and in the second portion of the duodenum. Multiple non-obstructing duodenal ulcers, 1 with a visible vessel. Injected.    Protonix infusion 6/23 -->  1 U PRBC 09/10/2015   1 U PRBC 09/12/2015  Antimicrobials:   Vanco PO 09/09/2015 --> 09/10/2015   Levaquin 09/09/2015 1 dose  Zosyn 09/09/2015 -->   Subjective: No overnight events.   Objective: Filed Vitals:   09/11/15 2325 09/12/15 0420 09/12/15 0735 09/12/15 0843  BP: 144/68 156/55  133/89  Pulse: 81 89  99  Temp:  98.6 F (37 C)  98.6 F (37 C)  TempSrc:  Oral  Oral  Resp: _0 Height:      Weight:      SpO2: 95% 100% 100% 96%    Intake/Output Summary (Last 24 hours) at 09/12/15 1037 Last data filed at 09/12/15 0800  Gross per 24 hour  Intake 2398.75 ml  Output    400 ml  Net 1998.75 ml   Filed Weights   09/05/15 1309 09/09/15 1619 09/10/15 0427  Weight: 76.9 kg (169 lb 8.5 oz) 73.2 kg (161 lb 6 oz) 74 kg (163 lb 2.3 oz)    Examination:  General exam: more alert, no distress  Respiratory system: no wheezing, no rhonchi  Cardiovascular system: S1 & S2 (+), rate controlled  Gastrointestinal system: Appreciate bowel sounds, no tenderness, no  distention Central nervous system: Nonfocal, alert and awake Extremities: No lower extremity swelling, appreciate pulses bilaterally Skin: Skin is warm and dry Psychiatry: Normal mood and behavior  Data Reviewed: I have personally reviewed following labs and imaging studies  CBC:  Recent Labs Lab 09/10/15 0420 09/10/15 1650 09/11/15 0508 09/11/15 1704 09/12/15 0508  WBC 35.3* 31.2* 29.5* 33.1* 29.7*  HGB 7.5* 8.7* 8.4* 8.0* 7.5*  HCT 22.2* 26.2* 25.8* 24.4* 23.1*  MCV 81.9 85.6 84.9 85.0 85.6  PLT 408* 397 400 447* 121*   Basic Metabolic Panel:  Recent Labs Lab 09/07/15 0556 09/08/15 0533 09/09/15 0656 09/10/15 1419 09/11/15 0823  NA 130* 128* 133* 141 143  K 4.8 4.7 4.3 3.7 3.5  CL 96* 96* 100* 110 112*  CO2 _1 GLUCOSE 152* 130* 158* 118* 127*  BUN 38* 71* 70* 50* 26*  CREATININE 0.98 1.13* 1.33* 1.48* 1.39*  CALCIUM 9.1 8.5* 8.4* 8.3* 8.2*  MG  --   --   --  2.4  --    GFR: Estimated Creatinine Clearance: 30.6 mL/min (by C-G formula based on Cr of 1.39). Liver Function Tests: No results for input(s): AST, ALT, ALKPHOS, BILITOT, PROT, ALBUMIN in the last 168 hours. No results for input(s): LIPASE, AMYLASE in the last 168 hours. No results for input(s): AMMONIA in the last 168 hours. Coagulation Profile:  Recent Labs Lab 09/09/15 1157  INR 1.16   Cardiac Enzymes: No results for input(s): CKTOTAL, CKMB, CKMBINDEX, TROPONINI in the last 168 hours. BNP (last 3 results) No results for input(s): PROBNP in the last 8760 hours. HbA1C: No results for input(s): HGBA1C in the last 72 hours. CBG:  Recent Labs Lab 09/11/15 0811 09/11/15 1127 09/11/15 1543 09/11/15 2112 09/12/15 0841  GLUCAP 125* 157* 119* 147* 153*   Lipid Profile: No results for input(s): CHOL, HDL, LDLCALC, TRIG, CHOLHDL, LDLDIRECT in the last 72 hours. Thyroid Function Tests: No results for input(s): TSH, T4TOTAL, FREET4, T3FREE, THYROIDAB in the last 72 hours. Anemia  Panel: No results for input(s): VITAMINB12, FOLATE, FERRITIN, TIBC, IRON, RETICCTPCT in the last 72 hours. Urine analysis:    Component Value Date/Time   COLORURINE YELLOW 09/10/2015 0251   APPEARANCEUR CLEAR 09/10/2015 0251   LABSPEC 1.021 09/10/2015 0251   PHURINE 5.5 09/10/2015 0251   GLUCOSEU NEGATIVE 09/10/2015 0251   HGBUR NEGATIVE 09/10/2015 0251   BILIRUBINUR NEGATIVE 09/10/2015 0251   KETONESUR NEGATIVE 09/10/2015 0251   PROTEINUR NEGATIVE 09/10/2015 0251   UROBILINOGEN 0.2 09/02/2007 1301   NITRITE NEGATIVE 09/10/2015 0251   LEUKOCYTESUR NEGATIVE 09/10/2015 0251   Sepsis Labs: _0 (procalcitonin:4,lacticidven:4)   Recent Results (from the past 240 hour(s))  MRSA PCR Screening     Status: None   Collection Time: 09/04/15  5:35 PM  Result Value Ref Range Status   MRSA by PCR NEGATIVE NEGATIVE Final      Radiology Studies:  Mr Brain Wo Contrast 09/08/2015 1.  No acute intracranial abnormality. 2. 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 3. Left middle cranial fossa arachnoid cyst, most likely congenital and inconsequential. 4. Acute on chronic appearing right maxillary sinusitis.   Dg Chest Port 1 View 09/08/2015  Mild patchy basilar right lower lobe opacity, which could represent atelectasis, aspiration or pneumonia. Recommend follow-up PA and lateral post treatment chest radiographs in 4-6 weeks.   Ct Angio Chest Pe W Or Wo Contrast 09/03/2015   No evidence of pulmonary embolism. No acute cardiopulmonary disease. Minimal atherosclerotic coronary artery disease.   Dg Chest Port 1 View 09/03/2015  Bilateral mid and lower lung heterogeneous opacities favored to represent atelectasis. Infection not excluded.   Marland Kitchen amLODipine  10 mg Oral Daily  . atorvastatin  20 mg Oral Daily  . guaiFENesin  600 mg Oral BID  . insulin aspart  0-15 Units  Subcutaneous TID WC  . ipratropium-albuterol  3 mL Nebulization TID  . loratadine  10 mg Oral Daily  . metoprolol tartrate  25 mg Oral BID  . montelukast  10 mg Oral QHS  . [START ON 09/13/2015] pantoprazole  40 mg Intravenous Q12H  . piperacillin-tazobactam (ZOSYN)  IV  3.375 g Intravenous Q8H  . saccharomyces boulardii  250 mg Oral BID     Continuous Infusions: . sodium chloride 50 mL/hr at 09/12/15 0400  . pantoprozole (PROTONIX) infusion 8 mg/hr (09/12/15 0800)     LOS: 9 days  Time spent: 25 minutes  Greater than 50% of the time spent on counseling and coordinating the care.   Leisa Lenz, MD [facility] Pager [phone]  If 7PM-7AM, please contact night-coverage [email or web address] Password [facility] 09/12/2015, 10:37 AM

## 2015-09-12 NOTE — Progress Notes (Addendum)
Speech Language Pathology Treatment: Dysphagia  Patient Details Name: [name] MRN: [id number] DOB: 29-Dec-1932 Today's Date: 09/12/2015 Time: [id number] SLP Time Calculation (min) (ACUTE ONLY): 29 min  Assessment / Plan / Recommendation Clinical Impression  Pt disoriented, stating "Do I have to come back tomorrow?" even after being advised earlier that she was in the hospital.  Pt admits to issues with mild abdomen pain - stating she might need to use the restroom.  SLP observed pt consuming water via straw/cup and coffee via cup.    Pt appeared with delayed oral transiting/holding and neck extension and reporting mild odynophagia.   Subtle dry cough noted immediately post=swallow of thin water via straw x2/2 and cup x1/3 with poor pt awareness.   Observation of coffee intake with continued suspected delayed oral transiting but no indication of airway compromise.  SLP was unable to observe pt further with po due to her need to use restroom.   SLP reviewed esophagram with pt from 2013 and pt admits to issues with sensing food lodging in esophagus at times and that she may become easily "choked".  Advised her to dysphagia/aspiration mitigation strategies and provided her written precautions.  Pt advised she will be observant of her swallowing ability today and that she desire to continue clear liquid diet.  SLP to follow up for assistance with dysphagia management.     HPI HPI: Patient is a 82 y.o. female with medical history significant of CKD , HTN spinal stenosis,  GERD, HL, Asthma presenting with 3-day h/o dyspnea wheezing with gradually worsening, found to be hypoxic at Urgent Care and sent to ER.      SLP Plan  Continue with current plan of care     Recommendations  Diet recommendations: Thin liquid (pt desires to continue clears, reports poor appetite and some odynophagia) Liquids provided via: Cup;No straw Medication Administration: Whole meds with puree Compensations: Minimize  environmental distractions;Slow rate;Small sips/bites;Follow solids with liquid Postural Changes and/or Swallow Maneuvers: Seated upright 90 degrees;Upright 30-60 min after meal             Oral Care Recommendations: Oral care BID Follow up Recommendations: Skilled Nursing facility;24 hour supervision/assistance Plan: Continue with current plan of care     [place], [place] [facility] SLP [phone]

## 2015-09-12 NOTE — Progress Notes (Signed)
Pt. Experienced a short run of SVT with HR in the 140's, pt. Asymptomatic during episode.  Pt. Returned to baseline HR in the 80's.  MD notified, received no new orders.  Will continue to monitor.

## 2015-09-12 NOTE — Care Management Important Message (Signed)
Important Message  Patient Details  Name: [name] MRN: [id number] Date of Birth: 10-Sep-1932   Medicare Important Message Given:  Yes    [name] 09/12/2015, 10:41 AM

## 2015-09-13 ENCOUNTER — Inpatient Hospital Stay (HOSPITAL_COMMUNITY): Payer: Medicare Other

## 2015-09-13 LAB — GLUCOSE, CAPILLARY
GLUCOSE-CAPILLARY: 135 mg/dL — AB (ref 65–99)
Glucose-Capillary: 124 mg/dL — ABNORMAL HIGH (ref 65–99)
Glucose-Capillary: 105 mg/dL — ABNORMAL HIGH (ref 65–99)
Glucose-Capillary: 117 mg/dL — ABNORMAL HIGH (ref 65–99)

## 2015-09-13 LAB — CBC
HEMATOCRIT: 29.4 % — AB (ref 36.0–46.0)
Hemoglobin: 9.5 g/dL — ABNORMAL LOW (ref 12.0–15.0)
MCH: 27.6 pg (ref 26.0–34.0)
MCHC: 32.3 g/dL (ref 30.0–36.0)
MCV: 85.5 fL (ref 78.0–100.0)
Platelets: 339 10*3/uL (ref 150–400)
RBC: 3.44 MIL/uL — ABNORMAL LOW (ref 3.87–5.11)
RDW: 15.6 % — AB (ref 11.5–15.5)
WBC: 31.6 10*3/uL — ABNORMAL HIGH (ref 4.0–10.5)

## 2015-09-13 LAB — TYPE AND SCREEN
ABO/RH(D): O POS
ANTIBODY SCREEN: NEGATIVE
UNIT DIVISION: 0
UNIT DIVISION: 0
Unit division: 0

## 2015-09-13 LAB — BASIC METABOLIC PANEL
Anion gap: 8 (ref 5–15)
BUN: 8 mg/dL (ref 6–20)
CALCIUM: 8.3 mg/dL — AB (ref 8.9–10.3)
CHLORIDE: 103 mmol/L (ref 101–111)
CO2: 26 mmol/L (ref 22–32)
CREATININE: 1.15 mg/dL — AB (ref 0.44–1.00)
GFR calc Af Amer: 50 mL/min — ABNORMAL LOW (ref >60)
GFR calc non Af Amer: 43 mL/min — ABNORMAL LOW (ref >60)
GLUCOSE: 96 mg/dL (ref 65–99)
Potassium: 2.9 mmol/L — ABNORMAL LOW (ref 3.5–5.1)
Sodium: 137 mmol/L (ref 135–145)

## 2015-09-13 MED ORDER — POTASSIUM CHLORIDE 10 MEQ/100ML IV SOLN
10.0000 meq | INTRAVENOUS | Status: AC
  Administered 2015-09-13 (×4): 10 meq via INTRAVENOUS
  Filled 2015-09-13 (×3): qty 100

## 2015-09-13 MED ORDER — PANTOPRAZOLE SODIUM 40 MG PO TBEC
40.0000 mg | DELAYED_RELEASE_TABLET | Freq: Two times a day (BID) | ORAL | Status: DC
  Administered 2015-09-13 – 2015-09-16 (×6): 40 mg via ORAL
  Filled 2015-09-13 (×6): qty 1

## 2015-09-13 NOTE — Progress Notes (Signed)
Physical Therapy Treatment Patient Details Name: [name] MRN: [id number] DOB: 1932-09-29 Today's Date: 09/13/2015    History of Present Illness [name] is a 82 y.o. female with medical history significant of CKD , HTN spinal stenosis, GERD, HL, Asthma who presented with dyspnea, wheezing and hypoxia.    PT Comments    Pt is progressing towards PT goals. She is having cognitive problems, especially with date and where she is. Pt tolerated her treatment well and did not have any dyspnea while gait training. Pt will benefit from continued PT to increase her functional independence.   Follow Up Recommendations  Home health PT;Supervision/Assistance - 24 hour     Equipment Recommendations  None recommended by PT    Recommendations for Other Services       Precautions / Restrictions Precautions Precautions: Fall Restrictions Weight Bearing Restrictions: No    Mobility   Transfers Overall transfer level: Modified independent Equipment used: Rolling walker (2 wheeled) Transfers: Sit to/from Stand Sit to Stand: Modified independent (Device/Increase time);+2 safety/equipment         General transfer comment: cues for hand placement  Ambulation/Gait Ambulation/Gait assistance: Min guard;+2 safety/equipment (for lines and chair to follow but chair not used) Ambulation Distance (Feet): 150 Feet Assistive device: Rolling walker (2 wheeled) Gait Pattern/deviations: Scissoring;Shuffle;Drifts right/left;Narrow base of support;Step-through pattern     General Gait Details: Pt stated no SOB while walking        Balance Overall balance assessment: No apparent balance deficits (not formally assessed)   Sitting balance-Leahy Scale: Good       Standing balance-Leahy Scale: Poor                      Cognition Arousal/Alertness: Awake/alert Behavior During Therapy: WFL for tasks assessed/performed Overall Cognitive Status: Impaired/Different from  baseline Area of Impairment: Memory;Problem solving;Awareness;Orientation;Safety/judgement Orientation Level: Disoriented to;Place;Time (time (July), place (where she is supposed to be))         Awareness: Emergent Problem Solving: Requires verbal cues General Comments: Pt's cognitive function during therapy was [facility] except for giving the wrong date. At end of treatment, pt overheard calling nurse stating she needed to be "rescued." She stated she needed to get back to her room, but was reassured that she was in the correct room and where she needed to be.           Pertinent Vitals/Pain Pain Assessment: No/denies pain    Home Living   Prior Function    PT Goals (current goals can now be found in the care plan section) Progress towards PT goals: Progressing toward goals    Frequency  Min 3X/week    PT Plan Current plan remains appropriate       End of Session Equipment Utilized During Treatment: Gait belt Activity Tolerance: Patient tolerated treatment well Patient left: in chair;with call bell/phone within reach;with chair alarm set     Time: 1547-1610 PT Time Calculation (min) (ACUTE ONLY): 23 min  Charges:  $Gait Training: 8-22 mins $Therapeutic Activity: 8-22 mins                    G CodesBenard [name] [id number]   09/13/2015, 5:28 PM

## 2015-09-13 NOTE — Progress Notes (Signed)
Patient ID: [name], female   DOB: 01/01/1933, 82 y.o.   MRN: [id number]  PROGRESS NOTE    Gagandeep Kossman Mroz  JKD:[id number] DOB: Jul 03, 1932 DOA: 09/03/2015  PCP: Odette Fraction, MD   Brief Narrative:  82 y.o. female with past medical history significant for hypertension, spinal stenosis, dyslipidemia, asthma. Patient presented to [facility] with worsening shortness of breath over past 3 days prior to this admission. She used at home inhalers with no significant symptomatic relief.  She initially presented to urgent care and found to be hypoxic around 80s and she was referred to emergency department for further evaluation. She was admitted for management of asthma exacerbation.  Significant events since admission: More lethargic on 6/22 afternoon, stable vitals. MRI with no acute intracranial findings.  Had few episodes of tarry stool 6/22 and GI did EGD 6/23 with findings indicated below. She was transferred to SDU 09/09/2015.   Assessment & Plan:  Acute blood loss anemia / Acute upper GI bleed secondary to duodenal bulb hemorrhage / Multiple duodenal ulcers one with visible vessel - Concern for upper GI bleed, has had blood and tarry stools - EGD 09/09/2015 - LA Grade D reflux esophagitis. No gross lesions in the stomach. Blood in the duodenal bulb and in the second portion of the duodenum. Multiple non-obstructing duodenal ulcers, 1 with a visible vessel. H/Pylori negative.  - Pt is status post status post injection and Endo Clipping of ulcer with visible vessel - She was started on Pepcid IV Q 12 hours 6/23 but after EGD started on protonix drip 09/09/2015 which was stopped 6/26 - Now on Protonix 40 mg PO BID - She has received here pRBC transfusion 6/24. Hemoglobin 7.5 on 6/26 so she has received another unit of pRBC 6/26 - Hemoglobin 9.3 this am - CT abdomen pelvis without findings of intraabdominal bleed - GI has signed off 09/12/2015  Leukocytosis / Sepsis,  unspecified organism / Possible aspiration pneumonitis  - Initially thought to be secondary to steroids. Steroids stopped 09/07/2015. Prior to that she has gotten prednisone 50 mg 6/21 and prior to that she was on IV solumedrol 60 mg Q 12 hours. - SIRS criteria met on admission.  - Sepsis criteria met on 6/22. Sepsis protocol utilized. Lactic acid WNL, procalcitonin mildly elevated at 0.26. WBC count up to 43.6. RR was 14-20 and HR 74-105 - Unclear source of infection. CXR with possible pneumonia and she was started on zosyn 6/23. She was also given PO vanco 6/23 for possible C.diff colitis but stopped 09/10/2015 because C. difficile PCR negative - Blood cultures so far show no growth - Leukocytosis is improving  - CBC pending this am - Continue zosyn for now (day #4) - Per SLP study - thin liquids, meds with pure.   Possible evolving SBO - CT abdomen yesterday down to rule out GI bleed. Findings suggestive of ileus and to obtain abd x ray in 12-24 hours from CT scan to make sure no developing SBO. - Abd x ray pending this am  Acute respiratory failure with hypoxia /Acute Asthma exacerbation  - Stable respirator status - CXR 6/22 showed mild patchy basilar lower lobe opacity (atelectasis or aspiration or pneumonia) - Continue DuoNeb on as-needed basis  Acute metabolic encephalopathy - Unclear etiology - MRI with no acute intracranial findings - Stable mental status, patient is alert, oriented to time, place and person  Accelerated hypertension / Essential hypertension - Continue Norvasc and metoprolol  - BP 165/78, she has  not gotten her am meds. BP yesterday 122/56.  Chronic kidney disease stage III - Baseline creatinine 1.4 about 10 months ago - Creatinine on this admission 1.09 and up to 1.4, still within baseline range  - Follow up BMP this am  Hypokalemia - Likely secondary to nebulizer treatments - Potassium subsequently within normal limits - BMP pending this  am  Depression - Discontinue celexa 6/23 due to lethargy - Continue to hold celexa - Stable   Dyslipidemia - Continue Lipitor 20 mg daily   Steroid induced hyperglycemia - A1c is 5.6 - Continue sliding scale insulin - CBGs in past 24 hours:114, 125, 124  Recent falls - PT evaluation - HHPT, orders placed but this may change considering prolonged hospitalization  - PT eval order placed again today for re- evaluation    DVT prophylaxis: SCD's bilaterally  Code Status: full code  Family Communication: called daughter Joelene Millin over the phone to give update on daily basis  Disposition Plan: abd x ray this am to follow up on CT scan from yesterday. She had a few beats of SVTs yesterday but asymptomatic. Would continue to monitor in step down unit for next 24 hours prior to transfer to medical floor.  Consultants:   Physical therapy   Pulmonary - respiratory team   Gastroenterology   Procedures:   EGD by Dr. Hilarie Fredrickson 09/09/2015 - LA Grade D reflux esophagitis. No gross lesions in the stomach. Blood in the duodenal bulb and in the second portion of the duodenum. Multiple non-obstructing duodenal ulcers, 1 with a visible vessel. Injected.    Protonix infusion 6/23 -->  1 U PRBC 09/10/2015   1 U PRBC 09/12/2015  Antimicrobials:   Vanco PO 09/09/2015 --> 09/10/2015   Levaquin 09/09/2015 1 dose  Zosyn 09/09/2015 -->   Subjective: No overnight events. No reports of bleeding.   Objective: Filed Vitals:   09/13/15 0325 09/13/15 0645 09/13/15 0728 09/13/15 0819  BP: 175/76 161/81  165/78  Pulse: 81 81  88  Temp:    98.2 F (36.8 C)  TempSrc:    Oral  Resp: '10 11  9  '$ Height:      Weight:      SpO2: 96% 98% 97% 98%    Intake/Output Summary (Last 24 hours) at 09/13/15 1002 Last data filed at 09/13/15 0800  Gross per 24 hour  Intake   2028 ml  Output    400 ml  Net   1628 ml   Filed Weights   09/05/15 1309 09/09/15 1619 09/10/15 0427  Weight: 76.9 kg (169 lb 8.5 oz)  73.2 kg (161 lb 6 oz) 74 kg (163 lb 2.3 oz)    Examination:  General exam: more alert, calm, comfortable  Respiratory system: bilateral air entry, no wheezing  Cardiovascular system: S1 & S2 (+), RRR Gastrointestinal system: Appreciate bowel sounds, non tender abdomen  Central nervous system: Nonfocal Extremities: No lower extremity edema, palpable pulses  Skin: no lesions or ulcers  Psychiatry: Normal mood  Data Reviewed: I have personally reviewed following labs and imaging studies  CBC:  Recent Labs Lab 09/10/15 0420 09/10/15 1650 09/11/15 0508 09/11/15 1704 09/12/15 0508 09/12/15 1951  WBC 35.3* 31.2* 29.5* 33.1* 29.7*  --   HGB 7.5* 8.7* 8.4* 8.0* 7.5* 9.3*  HCT 22.2* 26.2* 25.8* 24.4* 23.1* 28.1*  MCV 81.9 85.6 84.9 85.0 85.6  --   PLT 408* 397 400 447* 413*  --    Basic Metabolic Panel:  Recent Labs Lab 09/07/15 0556  09/08/15 0533 09/09/15 0656 09/10/15 1419 09/11/15 0823  NA 130* 128* 133* 141 143  K 4.8 4.7 4.3 3.7 3.5  CL 96* 96* 100* 110 112*  CO2 '24 25 23 24 23  '$ GLUCOSE 152* 130* 158* 118* 127*  BUN 38* 71* 70* 50* 26*  CREATININE 0.98 1.13* 1.33* 1.48* 1.39*  CALCIUM 9.1 8.5* 8.4* 8.3* 8.2*  MG  --   --   --  2.4  --    GFR: Estimated Creatinine Clearance: 30.6 mL/min (by C-G formula based on Cr of 1.39). Liver Function Tests: No results for input(s): AST, ALT, ALKPHOS, BILITOT, PROT, ALBUMIN in the last 168 hours. No results for input(s): LIPASE, AMYLASE in the last 168 hours. No results for input(s): AMMONIA in the last 168 hours. Coagulation Profile:  Recent Labs Lab 09/09/15 1157  INR 1.16   Cardiac Enzymes: No results for input(s): CKTOTAL, CKMB, CKMBINDEX, TROPONINI in the last 168 hours. BNP (last 3 results) No results for input(s): PROBNP in the last 8760 hours. HbA1C: No results for input(s): HGBA1C in the last 72 hours. CBG:  Recent Labs Lab 09/12/15 0841 09/12/15 1239 09/12/15 1655 09/12/15 2128 09/13/15 0815   GLUCAP 153* 136* 114* 125* 124*   Lipid Profile: No results for input(s): CHOL, HDL, LDLCALC, TRIG, CHOLHDL, LDLDIRECT in the last 72 hours. Thyroid Function Tests: No results for input(s): TSH, T4TOTAL, FREET4, T3FREE, THYROIDAB in the last 72 hours. Anemia Panel: No results for input(s): VITAMINB12, FOLATE, FERRITIN, TIBC, IRON, RETICCTPCT in the last 72 hours. Urine analysis:    Component Value Date/Time   COLORURINE YELLOW 09/10/2015 0251   APPEARANCEUR CLEAR 09/10/2015 0251   LABSPEC 1.021 09/10/2015 0251   PHURINE 5.5 09/10/2015 0251   GLUCOSEU NEGATIVE 09/10/2015 0251   HGBUR NEGATIVE 09/10/2015 0251   BILIRUBINUR NEGATIVE 09/10/2015 0251   KETONESUR NEGATIVE 09/10/2015 0251   PROTEINUR NEGATIVE 09/10/2015 0251   UROBILINOGEN 0.2 09/02/2007 1301   NITRITE NEGATIVE 09/10/2015 0251   LEUKOCYTESUR NEGATIVE 09/10/2015 0251   Sepsis Labs: '@LABRCNTIP'$ (procalcitonin:4,lacticidven:4)   Recent Results (from the past 240 hour(s))  MRSA PCR Screening     Status: None   Collection Time: 09/04/15  5:35 PM  Result Value Ref Range Status   MRSA by PCR NEGATIVE NEGATIVE Final      Radiology Studies:  Ct Abdomen Pelvis Wo Contrast 09/12/2015  1. Fluid filled small bowel loops in the right lower quadrant, with associated nonspecific air-fluid levels. This could represent enteritis and/or mild ileus. Recommend follow-up plain film examination in 12-24 hours to exclude the less likely possibility of early developing small bowel obstruction. 2. No evidence of intra-abdominal or intrapelvic hemorrhage. No free fluid. 3. Bladder is moderately distended. 4. Aortic atherosclerosis. Additional chronic/incidental findings detailed above. These results will be called to the ordering clinician or representative by the Radiologist Assistant, and communication documented in the PACS or zVision Dashboard. Electronically Signed   By: Franki Cabot M.D.   On: 09/12/2015 19:53   Mr Brain Wo  Contrast 09/08/2015 1.  No acute intracranial abnormality. 2. 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 3. Left middle cranial fossa arachnoid cyst, most likely congenital and inconsequential. 4. Acute on chronic appearing right maxillary sinusitis.   Dg Chest Port 1 View 09/08/2015  Mild patchy basilar right lower lobe opacity, which could represent atelectasis, aspiration or pneumonia. Recommend follow-up PA and lateral post treatment chest radiographs in 4-6 weeks.  Ct Angio Chest Pe W Or Wo Contrast 09/03/2015   No evidence of pulmonary embolism. No acute cardiopulmonary disease. Minimal atherosclerotic coronary artery disease.   Dg Chest Port 1 View 09/03/2015  Bilateral mid and lower lung heterogeneous opacities favored to represent atelectasis. Infection not excluded.   . sodium chloride   Intravenous Once  . amLODipine  10 mg Oral Daily  . atorvastatin  20 mg Oral Daily  . guaiFENesin  600 mg Oral BID  . insulin aspart  0-15 Units Subcutaneous TID WC  . ipratropium-albuterol  3 mL Nebulization TID  . loratadine  10 mg Oral Daily  . metoprolol tartrate  25 mg Oral BID  . montelukast  10 mg Oral QHS  . pantoprazole  40 mg Oral BID  . piperacillin-tazobactam (ZOSYN)  IV  3.375 g Intravenous Q8H  . saccharomyces boulardii  250 mg Oral BID  . sodium chloride flush  3 mL Intravenous Q12H     Continuous Infusions: . sodium chloride 50 mL/hr at 09/12/15 2300     LOS: 10 days    Time spent: 25 minutes  Greater than 50% of the time spent on counseling and coordinating the care.   Leisa Lenz, MD [facility] Pager [phone]  If 7PM-7AM, please contact night-coverage [email or web address] Password [email or web address] 09/13/2015, 10:02 AM

## 2015-09-13 NOTE — Progress Notes (Signed)
Speech Language Pathology Treatment: Dysphagia  Patient Details Name: [name] MRN: [id number] DOB: 30-Jan-1933 Today's Date: 09/13/2015 Time: [id number] SLP Time Calculation (min) (ACUTE ONLY): 29 min  Assessment / Plan / Recommendation Clinical Impression  Pt today admits to issues with coughing during intake x several months prior to admission.  Given known h/o esophageal dysmotility, suspect chronic issue with possible current exacerbation.  Pt educated to importance of oral care today and assisted with oral care using soft bristle toothbrush.     Pt observed taking medicine with applesauce given by RN and thin soup/Boost Breeze via cup/tsp.  She continues with subtle dry cough intermittently with intake via cup without awareness - leading to again suspicion of chronic issues.  ? If cough could be reflux/esophageal related as voice remains clear.    RN reports pt with poor intake = pt admits to poor appetite.   Mentation appears to continue to improve which will aid airway protection with self feeding.  Reviewed dysphagia mitigation strategies with pt in writing and verbally using teach back.    Recommend continue diet with precautions.  Will follow up x1 more when diet advanced.    Thanks for allowing SLP to help with pt's care.     HPI HPI: Patient is a 82 y.o. female with medical history significant of CKD , HTN spinal stenosis,  GERD, HL, Asthma presenting with 3-day h/o dyspnea wheezing with gradually worsening, found to be hypoxic at Urgent Care and sent to ER.  Pt found ot have GI bleed and is s/p procedure for repair.  Per RN she continues to have some bloody stools and  CT abdomen yeterday showed potential ileus.  SLP follow up to determine tolerance and advise to dysphagia mitigation strategies.        SLP Plan  Continue with current plan of care     Recommendations  Diet recommendations:  (pt remains on clear, given CT abdomen findings defer to MD/GI to indicate diet  advancement appropriateness) Liquids provided via: Cup;No straw Medication Administration: Whole meds with puree Supervision: Intermittent supervision to cue for compensatory strategies Compensations: Minimize environmental distractions;Slow rate;Small sips/bites;Follow solids with liquid Postural Changes and/or Swallow Maneuvers: Seated upright 90 degrees;Upright 30-60 min after meal             Oral Care Recommendations: Oral care BID Follow up Recommendations: Skilled Nursing facility;24 hour supervision/assistance Plan: Continue with current plan of care     [place], [place] [facility] SLP [phone]

## 2015-09-14 DIAGNOSIS — K264 Chronic or unspecified duodenal ulcer with hemorrhage: Secondary | ICD-10-CM

## 2015-09-14 DIAGNOSIS — E876 Hypokalemia: Secondary | ICD-10-CM

## 2015-09-14 DIAGNOSIS — N183 Chronic kidney disease, stage 3 (moderate): Secondary | ICD-10-CM

## 2015-09-14 DIAGNOSIS — D62 Acute posthemorrhagic anemia: Secondary | ICD-10-CM

## 2015-09-14 LAB — BASIC METABOLIC PANEL
Anion gap: 12 (ref 5–15)
BUN: 6 mg/dL (ref 6–20)
CALCIUM: 8.1 mg/dL — AB (ref 8.9–10.3)
CHLORIDE: 102 mmol/L (ref 101–111)
CO2: 22 mmol/L (ref 22–32)
CREATININE: 1.2 mg/dL — AB (ref 0.44–1.00)
GFR calc non Af Amer: 41 mL/min — ABNORMAL LOW (ref >60)
GFR, EST AFRICAN AMERICAN: 48 mL/min — AB (ref >60)
GLUCOSE: 98 mg/dL (ref 65–99)
Potassium: 3.2 mmol/L — ABNORMAL LOW (ref 3.5–5.1)
Sodium: 136 mmol/L (ref 135–145)

## 2015-09-14 LAB — GLUCOSE, CAPILLARY
GLUCOSE-CAPILLARY: 122 mg/dL — AB (ref 65–99)
GLUCOSE-CAPILLARY: 78 mg/dL (ref 65–99)
GLUCOSE-CAPILLARY: 133 mg/dL — AB (ref 65–99)
Glucose-Capillary: 81 mg/dL (ref 65–99)

## 2015-09-14 LAB — CBC
HEMATOCRIT: 29 % — AB (ref 36.0–46.0)
HEMOGLOBIN: 9.8 g/dL — AB (ref 12.0–15.0)
MCH: 29.1 pg (ref 26.0–34.0)
MCHC: 33.8 g/dL (ref 30.0–36.0)
MCV: 86.1 fL (ref 78.0–100.0)
Platelets: 427 10*3/uL — ABNORMAL HIGH (ref 150–400)
RBC: 3.37 MIL/uL — ABNORMAL LOW (ref 3.87–5.11)
RDW: 16.1 % — AB (ref 11.5–15.5)
WBC: 29.5 10*3/uL — ABNORMAL HIGH (ref 4.0–10.5)

## 2015-09-14 LAB — CULTURE, BLOOD (ROUTINE X 2)
CULTURE: NO GROWTH
CULTURE: NO GROWTH

## 2015-09-14 LAB — MAGNESIUM: Magnesium: 1.6 mg/dL — ABNORMAL LOW (ref 1.7–2.4)

## 2015-09-14 MED ORDER — LOPERAMIDE HCL 2 MG PO CAPS
2.0000 mg | ORAL_CAPSULE | Freq: Once | ORAL | Status: AC
  Administered 2015-09-14: 2 mg via ORAL
  Filled 2015-09-14: qty 1

## 2015-09-14 NOTE — Progress Notes (Signed)
Patient to transfer to 5w09 report given to receiving nurse, all questions answered.  Pt. VSS with no s/s of distress noted.

## 2015-09-14 NOTE — Care Management Important Message (Signed)
Important Message  Patient Details  Name: [name] MRN: [id number] Date of Birth: June 28, 1932   Medicare Important Message Given:  Yes    Loann Quill 09/14/2015, 10:07 AM

## 2015-09-14 NOTE — Progress Notes (Signed)
Patient ID: [name], female   DOB: 06-Jan-1933, 82 y.o.   MRN: [id number]  PROGRESS NOTE    [name]  [id number] DOB: January 25, 1933 DOA: 09/03/2015  PCP: Odette Fraction, MD   Brief Narrative:  82 y.o. female with past medical history significant for hypertension, spinal stenosis, dyslipidemia, asthma. Patient presented to [facility] with worsening shortness of breath over past 3 days prior to this admission. She used at home inhalers with no significant symptomatic relief.  She initially presented to urgent care and found to be hypoxic around 80s and she was referred to emergency department for further evaluation. She was admitted for management of asthma exacerbation.  Significant events since admission: More lethargic on 6/22 afternoon, stable vitals. MRI with no acute intracranial findings.  Had few episodes of tarry stool 6/22 and GI did EGD 6/23 with findings indicated below. She was transferred to SDU 09/09/2015.   Assessment & Plan:  Acute blood loss anemia / Acute upper GI bleed secondary to duodenal bulb hemorrhage / Multiple duodenal ulcers one with visible vessel - developed blood and tarry stools after admission - EGD 09/09/2015 - LA Grade D reflux esophagitis. No gross lesions in the stomach. Blood in the duodenal bulb and in the second portion of the duodenum. Multiple non-obstructing duodenal ulcers, 1 with a visible vessel. H/Pylori negative.  - s/p injection and Endo Clipping of ulcer with visible vessel, was on IV PPI, now PO - s/p 2 units PRBC - CT abdomen pelvis without findings of intraabdominal bleed - GI has signed off 09/12/2015  Leukocytosis / Sepsis, unspecified organism / Aspiration pneumonia - Initially thought to be secondary to steroids.  - Sepsis criteria met on 6/22. Sepsis protocol utilized. Lactic acid WNL, procalcitonin mildly elevated at 0.26. WBC count up to 43.6. RR was 14-20 and HR 74-105 - CXR with RLL pneumonia and she  was started on zosyn 6/23. She was also given PO vanco 6/23 for possible C.diff colitis but stopped 09/10/2015 because C. difficile PCR negative - Blood cultures so far show no growth - Leukocytosis is improving,afebrile now - Day 5 of Zosyn, stop after 1-67moe days - Per SLP study - thin liquids, meds with pure.   Ileus -resolved -tolerating Po and Having loose stools  Diarrhea -due to Abx, resolving ileus -Cdiff PCR negative  Acute respiratory failure with hypoxia /Acute Asthma exacerbation  -improved, had Asthma exacerbation and Asp PNA  Acute metabolic encephalopathy - due to Sepsis/Aspiration pneumonia - MRI with no acute intracranial findings - improved back to baseline  Accelerated hypertension / Essential hypertension - Continue Norvasc and metoprolol   Chronic kidney disease stage III - Baseline creatinine 1.4 about 10 months ago - Creatinine on this admission 1.09 and up to 1.4, still within baseline range   Hypokalemia - Likely secondary to nebulizer treatments - Potassium subsequently within normal limits  Depression - Discontinue celexa 6/23 due to lethargy - Continue to hold celexa - Stable   Dyslipidemia - Continue Lipitor 20 mg daily   Steroid induced hyperglycemia - A1c is 5.6 - Continue sliding scale insulin - CBGs in past 24 hours:114, 125, 124  Recent falls - PT evaluation - HHPT, orders placed but this may change considering prolonged hospitalization  - PT eval order placed for re- evaluation    DVT prophylaxis: SCD's bilaterally  Code Status: full code  Family Communication: d/w daughter at bedside  Disposition Plan: Tx to tele, home in 2days  Consultants:   Physical therapy  Pulmonary - respiratory team   Gastroenterology   Procedures:   EGD by Dr. Hilarie Fredrickson 09/09/2015 - LA Grade D reflux esophagitis. No gross lesions in the stomach. Blood in the duodenal bulb and in the second portion of the duodenum. Multiple non-obstructing  duodenal ulcers, 1 with a visible vessel. Injected.    Protonix infusion 6/23 -->  1 U PRBC 09/10/2015   1 U PRBC 09/12/2015  Antimicrobials:   Vanco PO 09/09/2015 --> 09/10/2015   Levaquin 09/09/2015 1 dose  Zosyn 09/09/2015 -->   Subjective: Feels well, some freq diarrhea  Objective: Filed Vitals:   09/13/15 2332 09/14/15 0345 09/14/15 0821 09/14/15 1250  BP: 147/68 163/81 149/83 143/75  Pulse: 73 75 84 77  Temp: 98.1 F (36.7 C) 98.4 F (36.9 C) 97.6 F (36.4 C) 98.2 F (36.8 C)  TempSrc: Oral Oral Oral Oral  Resp: '20 15 12 12  '$ Height:      Weight:      SpO2: 98% 95% 96% 97%    Intake/Output Summary (Last 24 hours) at 09/14/15 1513 Last data filed at 09/14/15 0800  Gross per 24 hour  Intake   1140 ml  Output      0 ml  Net   1140 ml   Filed Weights   09/05/15 1309 09/09/15 1619 09/10/15 0427  Weight: 76.9 kg (169 lb 8.5 oz) 73.2 kg (161 lb 6 oz) 74 kg (163 lb 2.3 oz)    Examination:  General exam: more alert, calm, comfortable  Respiratory system: bilateral air entry, no wheezing  Cardiovascular system: S1 & S2 (+), RRR Gastrointestinal system: Appreciate bowel sounds, non tender abdomen  Central nervous system: Nonfocal Extremities: No lower extremity edema, palpable pulses  Skin: no lesions or ulcers  Psychiatry: Normal mood  Data Reviewed: I have personally reviewed following labs and imaging studies  CBC:  Recent Labs Lab 09/11/15 0508 09/11/15 1704 09/12/15 0508 09/12/15 1951 09/13/15 1210 09/14/15 0500  WBC 29.5* 33.1* 29.7*  --  31.6* 29.5*  HGB 8.4* 8.0* 7.5* 9.3* 9.5* 9.8*  HCT 25.8* 24.4* 23.1* 28.1* 29.4* 29.0*  MCV 84.9 85.0 85.6  --  85.5 86.1  PLT 400 447* 413*  --  339 237*   Basic Metabolic Panel:  Recent Labs Lab 09/09/15 0656 09/10/15 1419 09/11/15 0823 09/13/15 1210 09/14/15 0500  NA 133* 141 143 137 136  K 4.3 3.7 3.5 2.9* 3.2*  CL 100* 110 112* 103 102  CO2 '23 24 23 26 22  '$ GLUCOSE 158* 118* 127* 96 98  BUN  70* 50* 26* 8 6  CREATININE 1.33* 1.48* 1.39* 1.15* 1.20*  CALCIUM 8.4* 8.3* 8.2* 8.3* 8.1*  MG  --  2.4  --   --  1.6*   GFR: Estimated Creatinine Clearance: 35.4 mL/min (by C-G formula based on Cr of 1.2). Liver Function Tests: No results for input(s): AST, ALT, ALKPHOS, BILITOT, PROT, ALBUMIN in the last 168 hours. No results for input(s): LIPASE, AMYLASE in the last 168 hours. No results for input(s): AMMONIA in the last 168 hours. Coagulation Profile:  Recent Labs Lab 09/09/15 1157  INR 1.16   Cardiac Enzymes: No results for input(s): CKTOTAL, CKMB, CKMBINDEX, TROPONINI in the last 168 hours. BNP (last 3 results) No results for input(s): PROBNP in the last 8760 hours. HbA1C: No results for input(s): HGBA1C in the last 72 hours. CBG:  Recent Labs Lab 09/13/15 1205 09/13/15 1643 09/13/15 2124 09/14/15 0820 09/14/15 1253  GLUCAP 105* 117* 135* 122* 78  Lipid Profile: No results for input(s): CHOL, HDL, LDLCALC, TRIG, CHOLHDL, LDLDIRECT in the last 72 hours. Thyroid Function Tests: No results for input(s): TSH, T4TOTAL, FREET4, T3FREE, THYROIDAB in the last 72 hours. Anemia Panel: No results for input(s): VITAMINB12, FOLATE, FERRITIN, TIBC, IRON, RETICCTPCT in the last 72 hours. Urine analysis:    Component Value Date/Time   COLORURINE YELLOW 09/10/2015 0251   APPEARANCEUR CLEAR 09/10/2015 0251   LABSPEC 1.021 09/10/2015 0251   PHURINE 5.5 09/10/2015 0251   GLUCOSEU NEGATIVE 09/10/2015 0251   HGBUR NEGATIVE 09/10/2015 0251   BILIRUBINUR NEGATIVE 09/10/2015 0251   KETONESUR NEGATIVE 09/10/2015 0251   PROTEINUR NEGATIVE 09/10/2015 0251   UROBILINOGEN 0.2 09/02/2007 1301   NITRITE NEGATIVE 09/10/2015 0251   LEUKOCYTESUR NEGATIVE 09/10/2015 0251   Sepsis Labs: '@LABRCNTIP'$ (procalcitonin:4,lacticidven:4)   Recent Results (from the past 240 hour(s))  MRSA PCR Screening     Status: None   Collection Time: 09/04/15  5:35 PM  Result Value Ref Range Status    MRSA by PCR NEGATIVE NEGATIVE Final      Radiology Studies:  Ct Abdomen Pelvis Wo Contrast 09/12/2015  1. Fluid filled small bowel loops in the right lower quadrant, with associated nonspecific air-fluid levels. This could represent enteritis and/or mild ileus. Recommend follow-up plain film examination in 12-24 hours to exclude the less likely possibility of early developing small bowel obstruction. 2. No evidence of intra-abdominal or intrapelvic hemorrhage. No free fluid. 3. Bladder is moderately distended. 4. Aortic atherosclerosis. Additional chronic/incidental findings detailed above. These results will be called to the ordering clinician or representative by the Radiologist Assistant, and communication documented in the PACS or zVision Dashboard. Electronically Signed   By: Franki Cabot M.D.   On: 09/12/2015 19:53   Mr Brain Wo Contrast 09/08/2015 1.  No acute intracranial abnormality. 2. 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 3. Left middle cranial fossa arachnoid cyst, most likely congenital and inconsequential. 4. Acute on chronic appearing right maxillary sinusitis.   Dg Chest Port 1 View 09/08/2015  Mild patchy basilar right lower lobe opacity, which could represent atelectasis, aspiration or pneumonia. Recommend follow-up PA and lateral post treatment chest radiographs in 4-6 weeks.   Ct Angio Chest Pe W Or Wo Contrast 09/03/2015   No evidence of pulmonary embolism. No acute cardiopulmonary disease. Minimal atherosclerotic coronary artery disease.   Dg Chest Port 1 View 09/03/2015  Bilateral mid and lower lung heterogeneous opacities favored to represent atelectasis. Infection not excluded.   Marland Kitchen amLODipine  10 mg Oral Daily  . atorvastatin  20 mg Oral Daily  . guaiFENesin  600 mg Oral BID  . insulin aspart  0-15 Units Subcutaneous TID WC  . loratadine  10  mg Oral Daily  . metoprolol tartrate  25 mg Oral BID  . montelukast  10 mg Oral QHS  . pantoprazole  40 mg Oral BID  . piperacillin-tazobactam (ZOSYN)  IV  3.375 g Intravenous Q8H  . saccharomyces boulardii  250 mg Oral BID  . sodium chloride flush  3 mL Intravenous Q12H     Continuous Infusions:     LOS: 11 days    Time spent: 25 minutes  Greater than 50% of the time spent on counseling and coordinating the care.   Domenic Polite, MD [facility] Pager [phone] If 7PM-7AM, please contact night-coverage [email or web address] Password [facility] 09/14/2015, 3:13 PM

## 2015-09-15 LAB — BASIC METABOLIC PANEL
ANION GAP: 9 (ref 5–15)
BUN: 7 mg/dL (ref 6–20)
CALCIUM: 8.6 mg/dL — AB (ref 8.9–10.3)
CO2: 23 mmol/L (ref 22–32)
Chloride: 104 mmol/L (ref 101–111)
Creatinine, Ser: 1.08 mg/dL — ABNORMAL HIGH (ref 0.44–1.00)
GFR, EST AFRICAN AMERICAN: 54 mL/min — AB (ref >60)
GFR, EST NON AFRICAN AMERICAN: 47 mL/min — AB (ref >60)
GLUCOSE: 102 mg/dL — AB (ref 65–99)
Potassium: 3.2 mmol/L — ABNORMAL LOW (ref 3.5–5.1)
Sodium: 136 mmol/L (ref 135–145)

## 2015-09-15 LAB — GLUCOSE, CAPILLARY
GLUCOSE-CAPILLARY: 125 mg/dL — AB (ref 65–99)
GLUCOSE-CAPILLARY: 95 mg/dL (ref 65–99)
GLUCOSE-CAPILLARY: 102 mg/dL — AB (ref 65–99)
Glucose-Capillary: 102 mg/dL — ABNORMAL HIGH (ref 65–99)

## 2015-09-15 LAB — CBC
HCT: 31.2 % — ABNORMAL LOW (ref 36.0–46.0)
Hemoglobin: 10.3 g/dL — ABNORMAL LOW (ref 12.0–15.0)
MCH: 28.9 pg (ref 26.0–34.0)
MCHC: 33 g/dL (ref 30.0–36.0)
MCV: 87.6 fL (ref 78.0–100.0)
PLATELETS: 418 10*3/uL — AB (ref 150–400)
RBC: 3.56 MIL/uL — ABNORMAL LOW (ref 3.87–5.11)
RDW: 16.2 % — AB (ref 11.5–15.5)
WBC: 26.4 10*3/uL — AB (ref 4.0–10.5)

## 2015-09-15 MED ORDER — POTASSIUM CHLORIDE CRYS ER 20 MEQ PO TBCR
40.0000 meq | EXTENDED_RELEASE_TABLET | Freq: Two times a day (BID) | ORAL | Status: AC
  Administered 2015-09-15 (×2): 40 meq via ORAL
  Filled 2015-09-15 (×2): qty 2

## 2015-09-15 MED ORDER — LOPERAMIDE HCL 2 MG PO CAPS
2.0000 mg | ORAL_CAPSULE | Freq: Once | ORAL | Status: AC
  Administered 2015-09-15: 2 mg via ORAL
  Filled 2015-09-15: qty 1

## 2015-09-15 MED ORDER — AMOXICILLIN-POT CLAVULANATE 875-125 MG PO TABS
1.0000 | ORAL_TABLET | Freq: Two times a day (BID) | ORAL | Status: DC
  Administered 2015-09-15 (×2): 1 via ORAL
  Filled 2015-09-15 (×2): qty 1

## 2015-09-15 NOTE — Progress Notes (Signed)
Speech Language Pathology Treatment: Dysphagia  Patient Details Name: [name] MRN: [id number] DOB: [birth year] Today's Date: 09/15/2015 Time: 8185-6314 SLP Time Calculation (min) (ACUTE ONLY): 22 min  Assessment / Plan / Recommendation Clinical Impression  SLp visited pt at am meal, reinforced need for oral care prior to meals and assisted pt. Pt with cough prior to PO and slight coughing during meal, but not immediate following bites and sips.Provided moderate facing to min cues for liquid wash after ever few bites. Reinforced positioning for esophageal precautions. Pt does not demonstrated capacity to recall recommendations independently. Pt will need 24 hour supervision at d/c. Will discuss precautions with daughter. No f/u needed.    HPI HPI: Patient is a 82 y.o. female with medical history significant of CKD , HTN spinal stenosis,  GERD, HL, Asthma presenting with 3-day h/o dyspnea wheezing with gradually worsening, found to be hypoxic at Urgent Care and sent to ER.  Pt found ot have GI bleed and is s/p procedure for repair.  Per RN she continues to have some bloody stools and  CT abdomen yeterday showed potential ileus.  SLP follow up to determine tolerance and advise to dysphagia mitigation strategies.        SLP Plan  All goals met     Recommendations  Diet recommendations: Thin liquid;Regular Liquids provided via: Cup;No straw Medication Administration: Whole meds with puree Supervision: Intermittent supervision to cue for compensatory strategies Compensations: Minimize environmental distractions;Slow rate;Small sips/bites;Follow solids with liquid Postural Changes and/or Swallow Maneuvers: Seated upright 90 degrees;Upright 30-60 min after meal             Oral Care Recommendations: Oral care BID Follow up Recommendations: Skilled Nursing facility;24 hour supervision/assistance Plan: All goals met     GO               Herbie [place], MA CCC-SLP  [phone]   [name] 09/15/2015, 9:25 AM

## 2015-09-15 NOTE — Progress Notes (Signed)
Physical Therapy Treatment Patient Details Name: [name] MRN: [id number] DOB: 09/20/1932 Today's Date: 09/15/2015    History of Present Illness [name] is a 82 y.o. female with medical history significant of CKD , HTN spinal stenosis, GERD, HL, Asthma who presented with dyspnea, wheezing and hypoxia.    PT Comments    Patient progressing with independence, but still limited due to weakness.  Still feel she is safe for d/c home with family support (24 hr initially) and follow up HHPT.   Follow Up Recommendations  Home health PT;Supervision/Assistance - 24 hour     Equipment Recommendations  None recommended by PT    Recommendations for Other Services       Precautions / Restrictions Precautions Precautions: Fall Restrictions Weight Bearing Restrictions: No    Mobility  Bed Mobility               General bed mobility comments: up in chair  Transfers Overall transfer level: Modified independent Equipment used: Rolling walker (2 wheeled) Transfers: Sit to/from Stand           General transfer comment: stood without assist before I could get walker to her  Ambulation/Gait Ambulation/Gait assistance: Supervision Ambulation Distance (Feet): 125 Feet Assistive device: Rolling walker (2 wheeled);None Gait Pattern/deviations: Step-through pattern;Trunk flexed;Decreased stride length;Drifts right/left     General Gait Details: SOB after going to bathroom so rested in recliner (HR 91, SpO2 96% on RA) Ambulated in hall with RW cues for proximity to walker and posture with SpO2 99-100% on RA and some audible wheezes.  Short distance at end no walker with supervision to minguard for balance (pt states wouldn't want to go far without walker,)   Stairs            Wheelchair Mobility    Modified Rankin (Stroke Patients Only)       Balance Overall balance assessment: Needs assistance         Standing balance support: No upper extremity  supported Standing balance-Leahy Scale: Fair Standing balance comment: seems stable no walker statically, but walks more securely with walker than without                     Cognition Arousal/Alertness: Awake/alert Behavior During Therapy: WFL for tasks assessed/performed Overall Cognitive Status: Within Functional Limits for tasks assessed       Memory: Decreased short-term memory              Exercises      General Comments General comments (skin integrity, edema, etc.): Discussed with patient her thoughts about going home.  She agrees she is weak, but also feels she can manage with family support at home.  Educated to use walker, to have assist when her daughter is at work and to not provide care for her great grandson (80 month old).  She verbalized understanding.  Also educated on fall prevention information for home.       Pertinent Vitals/Pain Pain Assessment: No/denies pain    Home Living                      Prior Function            PT Goals (current goals can now be found in the care plan section) Progress towards PT goals: Progressing toward goals    Frequency  Min 3X/week    PT Plan Current plan remains appropriate    Co-evaluation  End of Session Equipment Utilized During Treatment: Gait belt Activity Tolerance: Patient tolerated treatment well Patient left: in chair;with call bell/phone within reach;with chair alarm set     Time: [id number] PT Time Calculation (min) (ACUTE ONLY): 21 min  Charges:  $Gait Training: 8-22 mins                    G Codes:      Reginia Naas 10/10/2015, 1:52 PM  Magda Kiel, [place] 2015/10/10

## 2015-09-15 NOTE — Progress Notes (Signed)
Patient ID: [name], female   DOB: May 20, 1932, 82 y.o.   MRN: [id number]  PROGRESS NOTE    [name]  [id number] DOB: 05-05-1932 DOA: 09/03/2015  PCP: Odette Fraction, MD   Brief Narrative:  82 y.o. female with past medical history significant for hypertension, spinal stenosis, dyslipidemia, asthma. Patient presented to [facility] with worsening shortness of breath over past 3 days prior to this admission. She used at home inhalers with no significant symptomatic relief.  She initially presented to urgent care and found to be hypoxic around 80s and she was referred to emergency department for further evaluation. She was admitted for management of asthma exacerbation.  Significant events since admission: More lethargic on 6/22 afternoon, stable vitals. MRI with no acute intracranial findings.  Had few episodes of tarry stool 6/22 and GI did EGD 6/23 with findings indicated below. She was transferred to SDU 09/09/2015.   Assessment & Plan:  Acute blood loss anemia / Acute upper GI bleed secondary to duodenal bulb hemorrhage / Multiple duodenal ulcers one with visible vessel - developed blood and tarry stools after admission - EGD 09/09/2015 - LA Grade D reflux esophagitis. No gross lesions in the stomach. Blood in the duodenal bulb and in the second portion of the duodenum. Multiple non-obstructing duodenal ulcers, 1 with a visible vessel. H/Pylori negative.  - s/p injection and Endo Clipping of ulcer with visible vessel, was on IV PPI, now PO - s/p 2 units PRBC - CT abdomen pelvis without findings of intraabdominal bleed - GI has signed off 09/12/2015  Sepsis, unspecified organism / Aspiration pneumonia - leukocytosis initially thought to be secondary to steroids.  - Then sepsis criteria met on 6/22. WBC count up to 43.6. RR was 14-20 and HR 74-105 - CXR with RLL pneumonia and she was started on zosyn 6/23. She was also given PO vanco 6/23 for possible C.diff  colitis but stopped 09/10/2015 because C. difficile PCR negative - Blood cultures so far show no growth - Leukocytosis is improving,afebrile now - Day 6 of Zosyn, change to Po Augmentin today, stop after 7days of abx - Per SLP study - thin liquids, meds with pure.   Ileus -resolved -tolerating Po and Having loose stools  Diarrhea -due to Abx, resolving ileus -Cdiff PCR negative  Acute respiratory failure with hypoxia /Acute Asthma exacerbation  -improved, had Asthma exacerbation and Asp PNA  Acute metabolic encephalopathy - due to Sepsis/Aspiration pneumonia - MRI with no acute intracranial findings - improved back to baseline  Accelerated hypertension / Essential hypertension - Continue Norvasc and metoprolol   Chronic kidney disease stage III - Baseline creatinine 1.4 about 10 months ago - Creatinine on this admission 1.09 and up to 1.4, still within baseline range   Hypokalemia - Likely secondary to nebulizer treatments - Potassium subsequently within normal limits  Depression - Discontinue celexa 6/23 due to lethargy - Continue to hold celexa - Stable   Dyslipidemia - Continue Lipitor 20 mg daily   Steroid induced hyperglycemia - A1c is 5.6 - Continue sliding scale insulin - CBGs in past 24 hours:114, 125, 124  Recent falls - PT evaluation - HHPT, orders placed but this may change considering prolonged hospitalization  - PT eval order placed for re- evaluation    DVT prophylaxis: SCD's bilaterally  Code Status: full code  Family Communication: d/w daughter 6/28 Disposition Plan: Home tomorrow if stable  Consultants:   Physical therapy   Pulmonary - respiratory team   Gastroenterology  Procedures:   EGD by Dr. Hilarie Fredrickson 09/09/2015 - LA Grade D reflux esophagitis. No gross lesions in the stomach. Blood in the duodenal bulb and in the second portion of the duodenum. Multiple non-obstructing duodenal ulcers, 1 with a visible vessel. Injected.     Protonix infusion 6/23 -->  1 U PRBC 09/10/2015   1 U PRBC 09/12/2015  Antimicrobials:   Vanco PO 09/09/2015 --> 09/10/2015   Levaquin 09/09/2015 1 dose  Zosyn 09/09/2015 -->   Subjective: Feels well, improving, diarrhea better  Objective: Filed Vitals:   09/14/15 1610 09/14/15 2205 09/15/15 0537 09/15/15 1017  BP: 153/69 145/84 158/71 129/70  Pulse: 76 96 81 93  Temp: 97.7 F (36.5 C) 98.1 F (36.7 C) 99.7 F (37.6 C)   TempSrc: Oral     Resp: _0 Height:      Weight:      SpO2: 100% 98% 94%     Intake/Output Summary (Last 24 hours) at 09/15/15 1319 Last data filed at 09/15/15 1038  Gross per 24 hour  Intake    560 ml  Output    650 ml  Net    -90 ml   Filed Weights   09/09/15 1619 09/10/15 0427 09/14/15 1606  Weight: 73.2 kg (161 lb 6 oz) 74 kg (163 lb 2.3 oz) 69.4 kg (153 lb)    Examination:  General exam: more alert, calm, comfortable  Respiratory system: bilateral air entry, no wheezing  Cardiovascular system: S1 & S2 (+), RRR Gastrointestinal system: Appreciate bowel sounds, non tender abdomen  Central nervous system: Nonfocal Extremities: No lower extremity edema, palpable pulses  Skin: no lesions or ulcers  Psychiatry: Normal mood  Data Reviewed: I have personally reviewed following labs and imaging studies  CBC:  Recent Labs Lab 09/11/15 1704 09/12/15 0508 09/12/15 1951 09/13/15 1210 09/14/15 0500 09/15/15 0536  WBC 33.1* 29.7*  --  31.6* 29.5* 26.4*  HGB 8.0* 7.5* 9.3* 9.5* 9.8* 10.3*  HCT 24.4* 23.1* 28.1* 29.4* 29.0* 31.2*  MCV 85.0 85.6  --  85.5 86.1 87.6  PLT 447* 413*  --  339 427* 226*   Basic Metabolic Panel:  Recent Labs Lab 09/10/15 1419 09/11/15 0823 09/13/15 1210 09/14/15 0500 09/15/15 0536  NA 141 143 137 136 136  K 3.7 3.5 2.9* 3.2* 3.2*  CL 110 112* 103 102 104  CO2 _1 GLUCOSE 118* 127* 96 98 102*  BUN 50* 26* _2 CREATININE 1.48* 1.39* 1.15* 1.20* 1.08*  CALCIUM 8.3* 8.2* 8.3*  8.1* 8.6*  MG 2.4  --   --  1.6*  --    GFR: Estimated Creatinine Clearance: 38.6 mL/min (by C-G formula based on Cr of 1.08). Liver Function Tests: No results for input(s): AST, ALT, ALKPHOS, BILITOT, PROT, ALBUMIN in the last 168 hours. No results for input(s): LIPASE, AMYLASE in the last 168 hours. No results for input(s): AMMONIA in the last 168 hours. Coagulation Profile:  Recent Labs Lab 09/09/15 1157  INR 1.16   Cardiac Enzymes: No results for input(s): CKTOTAL, CKMB, CKMBINDEX, TROPONINI in the last 168 hours. BNP (last 3 results) No results for input(s): PROBNP in the last 8760 hours. HbA1C: No results for input(s): HGBA1C in the last 72 hours. CBG:  Recent Labs Lab 09/14/15 1253 09/14/15 1746 09/14/15 2208 09/15/15 0746 09/15/15 1208  GLUCAP 78 81 133* 102* 125*   Lipid Profile: No results for input(s): CHOL, HDL, LDLCALC, TRIG, CHOLHDL, LDLDIRECT in  the last 72 hours. Thyroid Function Tests: No results for input(s): TSH, T4TOTAL, FREET4, T3FREE, THYROIDAB in the last 72 hours. Anemia Panel: No results for input(s): VITAMINB12, FOLATE, FERRITIN, TIBC, IRON, RETICCTPCT in the last 72 hours. Urine analysis:    Component Value Date/Time   COLORURINE YELLOW 09/10/2015 0251   APPEARANCEUR CLEAR 09/10/2015 0251   LABSPEC 1.021 09/10/2015 0251   PHURINE 5.5 09/10/2015 0251   GLUCOSEU NEGATIVE 09/10/2015 0251   HGBUR NEGATIVE 09/10/2015 0251   BILIRUBINUR NEGATIVE 09/10/2015 0251   KETONESUR NEGATIVE 09/10/2015 0251   PROTEINUR NEGATIVE 09/10/2015 0251   UROBILINOGEN 0.2 09/02/2007 1301   NITRITE NEGATIVE 09/10/2015 0251   LEUKOCYTESUR NEGATIVE 09/10/2015 0251   Sepsis Labs: _0 (procalcitonin:4,lacticidven:4)   Recent Results (from the past 240 hour(s))  MRSA PCR Screening     Status: None   Collection Time: 09/04/15  5:35 PM  Result Value Ref Range Status   MRSA by PCR NEGATIVE NEGATIVE Final      Radiology Studies:  Ct Abdomen Pelvis Wo  Contrast 09/12/2015  1. Fluid filled small bowel loops in the right lower quadrant, with associated nonspecific air-fluid levels. This could represent enteritis and/or mild ileus. Recommend follow-up plain film examination in 12-24 hours to exclude the less likely possibility of early developing small bowel obstruction. 2. No evidence of intra-abdominal or intrapelvic hemorrhage. No free fluid. 3. Bladder is moderately distended. 4. Aortic atherosclerosis. Additional chronic/incidental findings detailed above. These results will be called to the ordering clinician or representative by the Radiologist Assistant, and communication documented in the PACS or zVision Dashboard. Electronically Signed   By: Franki Cabot M.D.   On: 09/12/2015 19:53   Mr Brain Wo Contrast 09/08/2015 1.  No acute intracranial abnormality. 2. 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 3. Left middle cranial fossa arachnoid cyst, most likely congenital and inconsequential. 4. Acute on chronic appearing right maxillary sinusitis.   Dg Chest Port 1 View 09/08/2015  Mild patchy basilar right lower lobe opacity, which could represent atelectasis, aspiration or pneumonia. Recommend follow-up PA and lateral post treatment chest radiographs in 4-6 weeks.   Ct Angio Chest Pe W Or Wo Contrast 09/03/2015   No evidence of pulmonary embolism. No acute cardiopulmonary disease. Minimal atherosclerotic coronary artery disease.   Dg Chest Port 1 View 09/03/2015  Bilateral mid and lower lung heterogeneous opacities favored to represent atelectasis. Infection not excluded.   Marland Kitchen amLODipine  10 mg Oral Daily  . amoxicillin-clavulanate  1 tablet Oral Q12H  . atorvastatin  20 mg Oral Daily  . guaiFENesin  600 mg Oral BID  . insulin aspart  0-15 Units Subcutaneous TID WC  . loratadine  10 mg Oral Daily  . metoprolol tartrate   25 mg Oral BID  . montelukast  10 mg Oral QHS  . pantoprazole  40 mg Oral BID  . potassium chloride  40 mEq Oral BID  . saccharomyces boulardii  250 mg Oral BID  . sodium chloride flush  3 mL Intravenous Q12H     Continuous Infusions:     LOS: 12 days    Time spent: 25 minutes  Greater than 50% of the time spent on counseling and coordinating the care.   Domenic Polite, MD [facility] Pager [phone] If 7PM-7AM, please contact night-coverage [email or web address] Password [email or web address] 09/15/2015, 1:19 PM

## 2015-09-16 DIAGNOSIS — J690 Pneumonitis due to inhalation of food and vomit: Secondary | ICD-10-CM | POA: Insufficient documentation

## 2015-09-16 LAB — GLUCOSE, CAPILLARY
GLUCOSE-CAPILLARY: 100 mg/dL — AB (ref 65–99)
GLUCOSE-CAPILLARY: 150 mg/dL — AB (ref 65–99)

## 2015-09-16 LAB — BASIC METABOLIC PANEL
Anion gap: 8 (ref 5–15)
BUN: 9 mg/dL (ref 6–20)
CHLORIDE: 103 mmol/L (ref 101–111)
CO2: 24 mmol/L (ref 22–32)
Calcium: 8.9 mg/dL (ref 8.9–10.3)
Creatinine, Ser: 1.06 mg/dL — ABNORMAL HIGH (ref 0.44–1.00)
GFR calc non Af Amer: 48 mL/min — ABNORMAL LOW (ref >60)
GFR, EST AFRICAN AMERICAN: 56 mL/min — AB (ref >60)
Glucose, Bld: 99 mg/dL (ref 65–99)
POTASSIUM: 3.8 mmol/L (ref 3.5–5.1)
SODIUM: 135 mmol/L (ref 135–145)

## 2015-09-16 LAB — CBC
HCT: 32.1 % — ABNORMAL LOW (ref 36.0–46.0)
HEMOGLOBIN: 10.3 g/dL — AB (ref 12.0–15.0)
MCH: 28.3 pg (ref 26.0–34.0)
MCHC: 32.1 g/dL (ref 30.0–36.0)
MCV: 88.2 fL (ref 78.0–100.0)
Platelets: 416 10*3/uL — ABNORMAL HIGH (ref 150–400)
RBC: 3.64 MIL/uL — AB (ref 3.87–5.11)
RDW: 16.5 % — ABNORMAL HIGH (ref 11.5–15.5)
WBC: 19.9 10*3/uL — ABNORMAL HIGH (ref 4.0–10.5)

## 2015-09-16 MED ORDER — HYDROCODONE-ACETAMINOPHEN 5-325 MG PO TABS: 0.5000 | ORAL_TABLET | Freq: Four times a day (QID) | ORAL | Status: DC | PRN

## 2015-09-16 MED ORDER — GABAPENTIN 300 MG PO CAPS: 300.0000 mg | ORAL_CAPSULE | Freq: Every day | ORAL | Status: DC

## 2015-09-16 MED ORDER — PANTOPRAZOLE SODIUM 40 MG PO TBEC: 40.0000 mg | DELAYED_RELEASE_TABLET | Freq: Two times a day (BID) | ORAL | Status: DC

## 2015-09-16 MED ORDER — METOPROLOL TARTRATE 25 MG PO TABS: 25.0000 mg | ORAL_TABLET | Freq: Two times a day (BID) | ORAL | Status: DC

## 2015-09-16 NOTE — Care Management Note (Addendum)
Case Management Note  Patient Details  Name: [name] MRN: [id number] Date of Birth: 02-03-1933  Subjective/Objective:            CM following for progression and d/c planning.        Action/Plan: 09/16/2015 Spoke with pt and daughter , daughter selected [facility] for [facility] services. [place] notified. Rolling walker ordered for delivery to room.   Expected Discharge Date:     09/16/2015             Expected Discharge Plan:  [place] Pt will d/c to home with daughter : address [address] [place], [place] [address]  Meliton Rattan  [phone] In-House Referral:  NA  Discharge planning Services  CM Consult  Post Acute Care Choice:  Home Health Choice offered to:  Adult Children  DME Arranged:  Walker rolling (, nebulizer machine at home) DME Agency:  NA  HH Arranged:  PT HH Agency:   [facility]  Status of Service:  Completed, signed off  If discussed at [place] of Stay Meetings, dates discussed:    Additional Comments:  Adron Bene, RN 09/16/2015, 10:46 AM

## 2015-09-16 NOTE — Discharge Summary (Signed)
Physician Discharge Summary  [name] RUE:[id number] DOB: 20-May-1932 DOA: 09/03/2015  PCP: Odette Fraction, MD  Admit date: 09/03/2015 Discharge date: 09/16/2015  Time spent:45 minutes  Recommendations for Outpatient Follow-up:  1. PCP in 1 week   Discharge Diagnoses:    Asthma exacerbation   Upper Gi bleed   Duodenal ulcer   Sepsis   Aspiration pneumonia   CKD (chronic kidney disease) stage 3, GFR 30-59 ml/min   Hypoxemia   Hypokalemia   Asthma exacerbation   Melena   Acute blood loss anemia   Duodenal ulcer hemorrhage   Leukocytosis   Discharge Condition: stable  Diet recommendation: low sodium  Filed Weights   09/09/15 1619 09/10/15 0427 09/14/15 1606  Weight: 73.2 kg (161 lb 6 oz) 74 kg (163 lb 2.3 oz) 69.4 kg (153 lb)    History of present illness:  82 y.o. female with past medical history significant for hypertension, spinal stenosis, dyslipidemia, asthma. Patient presented to [facility] with worsening shortness of breath over past 3 days prior to this admission. She used at home inhalers with no significant symptomatic relief. She initially presented to urgent care and found to be hypoxic around 80s and she was referred to emergency department for further evaluation  Hospital Course:   Acute asthma exacerbation -improved, resolved  Acute blood loss anemia / Acute upper GI bleed secondary to duodenal bulb hemorrhage / Multiple duodenal ulcers one with visible vessel - developed blood and tarry stools after admission overnight on 6/21 -Gi was consulted, she had an EGD on 6/23 - EGD 09/09/2015 - LA Grade D reflux esophagitis. Multiple non-obstructing duodenal ulcers, 1 with a visible vessel. H/Pylori negative. - s/p injection and Endo Clipping of ulcer with visible vessel, was on IV PPI, now changed to PO - s/p 2 units PRBC transfusion, no further bleeding now - CT abdomen pelvis without findings of intraabdominal bleed - GI signed off on  09/12/2015  Sepsis, unspecified organism / Aspiration pneumonia - Hospitalization was also complicated by Sepsis/aspiration pnuemonia - leukocytosis initially thought to be secondary to steroids.  - Then sepsis criteria met on 6/22. WBC count up to 43.6. RR was 14-20 and HR 74-105 - CXR with RLL pneumonia and she was started on zosyn 6/23. She was also given PO vanco 6/23 for possible C.diff colitis but stopped 09/10/2015 because C. difficile PCR negative - Blood cultures so far show no growth - Leukocytosis is improving,afebrile now - Day 6 of Zosyn, and 2days of augmentin, improving, Abx stopped - Per SLP study - thin liquids, meds with pure.  -leukocytosis continues to improve from 43K at peak to 19K now  Ileus -resolved -tolerating Po and Having loose stools  Diarrhea -due to Abx, resolving ileus -Cdiff PCR negative -improving  Acute respiratory failure with hypoxia /Acute Asthma exacerbation  -improved, had Asthma exacerbation and Asp PNA  Acute metabolic encephalopathy - due to Sepsis/Aspiration pneumonia - MRI with no acute intracranial findings - improved back to baseline  Accelerated hypertension / Essential hypertension - Continue Norvasc and metoprolol   Chronic kidney disease stage III - Baseline creatinine 1.4 about 10 months ago - Creatinine on this admission 1.09 and up to 1.4, still within baseline range   Hypokalemia - Likely secondary to nebulizer treatments - Potassium subsequently within normal limits  Depression - held celexa 6/23 due to lethargy - resumed now  Dyslipidemia - Continue Lipitor 20 mg daily   Steroid induced hyperglycemia - A1c is 5.6 - Continue sliding scale  insulin - CBGs in past 24 hours:114, 125, 124  Recent falls - PT evaluation - HHPT recommended   Consultants:   Physical therapy   Pulmonary - respiratory team   Gastroenterology  Procedures:   EGD by Dr. Hilarie Fredrickson 09/09/2015 - LA Grade D reflux esophagitis.  No gross lesions in the stomach. Blood in the duodenal bulb and in the second portion of the duodenum. Multiple non-obstructing duodenal ulcers, 1 with a visible vessel. Injected.   Protonix infusion 6/23 -->  1 U PRBC 09/10/2015   1 U PRBC 09/12/2015  Discharge Exam: Filed Vitals:   09/15/15 2201 09/16/15 0600  BP: 149/73 155/66  Pulse: 85 77  Temp: 98.9 F (37.2 C) 98.8 F (37.1 C)  Resp: 16 16    General: AAOx3 Cardiovascular: S1S2/RRR Respiratory: CTAB  Discharge Instructions   Discharge Instructions    Diet - low sodium heart healthy    Complete by:  As directed      Increase activity slowly    Complete by:  As directed           Current Discharge Medication List    START taking these medications   Details  metoprolol tartrate (LOPRESSOR) 25 MG tablet Take 1 tablet (25 mg total) by mouth 2 (two) times daily. Qty: 60 tablet, Refills: 0    pantoprazole (PROTONIX) 40 MG tablet Take 1 tablet (40 mg total) by mouth 2 (two) times daily. For 4weeks then daily Qty: 60 tablet, Refills: 1      CONTINUE these medications which have CHANGED   Details  gabapentin (NEURONTIN) 300 MG capsule Take 1 capsule (300 mg total) by mouth at bedtime.   Associated Diagnoses: Spinal stenosis of lumbar region    HYDROcodone-acetaminophen (NORCO/VICODIN) 5-325 MG tablet Take 0.5-1 tablets by mouth every 6 (six) hours as needed for moderate pain. Refills: 0      CONTINUE these medications which have NOT CHANGED   Details  albuterol (PROVENTIL HFA;VENTOLIN HFA) 108 (90 BASE) MCG/ACT inhaler Inhale 2 puffs into the lungs every 4 (four) hours as needed for wheezing or shortness of breath. Qty: 1 Inhaler, Refills: 0    albuterol (PROVENTIL) (2.5 MG/3ML) 0.083% nebulizer solution Take 3 mLs (2.5 mg total) by nebulization every 6 (six) hours as needed for wheezing or shortness of breath. Qty: 150 mL, Refills: 0    amLODipine (NORVASC) 10 MG tablet Take 1 tablet (10 mg total) by mouth  daily. Qty: 90 tablet, Refills: 1    atorvastatin (LIPITOR) 20 MG tablet Take 1 tablet (20 mg total) by mouth daily. Qty: 90 tablet, Refills: 3    Biotin 2500 MCG CAPS Take 2,500 mg by mouth daily.    butalbital-aspirin-caffeine (FIORINAL) 50-325-40 MG per capsule TAKE ONE CAPSULE BY MOUTH EVERY 8 HOURS AS NEEDED FOR HEADACHE Qty: 30 capsule, Refills: 0    cetirizine (ZYRTEC) 10 MG tablet Take 10 mg by mouth daily.    citalopram (CELEXA) 20 MG tablet TAKE 1 TABLET DAILY Qty: 90 tablet, Refills: 3    fish oil-omega-3 fatty acids 1000 MG capsule Take 2 g by mouth daily.    losartan-hydrochlorothiazide (HYZAAR) 50-12.5 MG tablet Take 1 tablet by mouth daily. Qty: 30 tablet, Refills: 11   Associated Diagnoses: Bilateral leg edema    montelukast (SINGULAIR) 10 MG tablet Take 1 tablet (10 mg total) by mouth at bedtime. Qty: 30 tablet, Refills: 3   Associated Diagnoses: Allergy to environmental factors    Multiple Vitamin (MULTIVITAMIN) capsule Take 1 capsule by  mouth daily.    vitamin B-12 (CYANOCOBALAMIN) 500 MCG tablet Take 500 mcg by mouth daily.    clotrimazole-betamethasone (LOTRISONE) cream Apply 1 application topically 2 (two) times daily. Qty: 30 g, Refills: 0   Associated Diagnoses: Candidal intertrigo      STOP taking these medications     cyclobenzaprine (FLEXERIL) 5 MG tablet        Allergies  Allergen Reactions  . Demerol [Meperidine] Other (See Comments)    Altered mental status  . Meperidine Hcl Other (See Comments)    Altered mental status   Follow-up Information    Follow up with [facility].   Why:  Home Health RN, Occupational Therapy, aide and Physical Therapy   Contact information:   [address] [place] [address] [phone]       Follow up with [facility] TOM, MD In 1 week.   Specialty:  Family Medicine   Contact information:   [address] [place] [address] [phone]        The results  of significant diagnostics from this hospitalization (including imaging, microbiology, ancillary and laboratory) are listed below for reference.    Significant Diagnostic Studies: Ct Abdomen Pelvis Wo Contrast  09/12/2015  CLINICAL DATA:  Diffuse abdominal pain, nausea and vomiting, shortness of breath. History of chronic kidney disease. Evaluate for possibility of intra-abdominal bleed. EXAM: CT ABDOMEN AND PELVIS WITHOUT CONTRAST TECHNIQUE: Multidetector CT imaging of the abdomen and pelvis was performed following the standard protocol without IV contrast. COMPARISON:  CT abdomen dated 06/10/2007. FINDINGS: Lower chest: Mild atelectasis at each lung base. Mitral annulus calcifications and coronary artery calcifications noted, incompletely imaged. Hepatobiliary: No mass visualized within the liver on this un-enhanced exam. Gallbladder appears normal. Pancreas: No mass or inflammatory process identified on this un-enhanced exam. Spleen: Within normal limits in size. Adrenals/Urinary Tract: Left renal cyst measures 6.7 x 5.7 cm. Right renal cyst measures 5.4 x 4.6 cm. Kidneys otherwise unremarkable without stone or hydronephrosis. No ureteral or bladder calculi identified, although the distal ureters and a portion of the bladder are obscured by metallic artifact emanating from patient's bilateral hip replacements. Bladder is moderately distended but otherwise unremarkable. Stomach/Bowel: No significantly dilated large or small bowel loops appreciated. Several fluid-filled small bowel loops in the right lower quadrant are upper normal in caliber. Nonspecific air-fluid levels within the right lower quadrant small bowel could indicate underlying enteritis and/or ileus. No evidence of bowel wall inflammation seen. Vascular/Lymphatic: Scattered atherosclerotic changes of the normal caliber abdominal aorta. No enlarged lymph nodes appreciated in the abdomen or pelvis. Reproductive: No mass or other significant  abnormality. Other: No free fluid or abscess collection identified. No free intraperitoneal air. Musculoskeletal: Degenerative changes throughout the scoliotic thoracolumbar spine, moderate in degree. No acute or suspicious osseous lesion. Bilateral hip replacements. Bilateral inguinal hernias which contain fat only. Superficial soft tissues are otherwise unremarkable. IMPRESSION: 1. Fluid filled small bowel loops in the right lower quadrant, with associated nonspecific air-fluid levels. This could represent enteritis and/or mild ileus. Recommend follow-up plain film examination in 12-24 hours to exclude the less likely possibility of early developing small bowel obstruction. 2. No evidence of intra-abdominal or intrapelvic hemorrhage. No free fluid. 3. Bladder is moderately distended. 4. Aortic atherosclerosis. Additional chronic/incidental findings detailed above. These results will be called to the ordering clinician or representative by the Radiologist Assistant, and communication documented in the PACS or zVision Dashboard. Electronically Signed   By: Franki Cabot  M.D.   On: 09/12/2015 19:53   Ct Angio Chest Pe W Or Wo Contrast  09/03/2015  CLINICAL DATA:  Shortness of breath.  Stage 3 kidney disease. EXAM: CT ANGIOGRAPHY CHEST WITH CONTRAST TECHNIQUE: Multidetector CT imaging of the chest was performed using the standard protocol during bolus administration of intravenous contrast. Multiplanar CT image reconstructions and MIPs were obtained to evaluate the vascular anatomy. CONTRAST:  75 mL Isovue 370 IV COMPARISON:  CT abdomen 06/10/2007 FINDINGS: Lungs are adequately inflated with minimal dependent atelectasis over the right base. Triangular intrapulmonary lymph node over the minor fissure. No lobar consolidation or effusion. Mild AP narrowing of the trachea and central bronchi. Heart is normal size. Calcified atherosclerotic plaque over the left anterior descending coronary artery. Mild calcified plaque  over the thoracic aorta. No evidence of pulmonary embolism. No significant hilar or mediastinal adenopathy. Remaining mediastinal structures are within normal. Images through the upper abdomen demonstrate no focal abnormality. There are degenerative changes of the spine. Stable mild L2 compression deformity. Review of the MIP images confirms the above findings. IMPRESSION: No evidence of pulmonary embolism. No acute cardiopulmonary disease. Minimal atherosclerotic coronary artery disease. Electronically Signed   By: Marin Olp M.D.   On: 09/03/2015 21:18   Mr Brain Wo Contrast  09/08/2015  CLINICAL DATA:  82 year old female with increasing shortness of breath and lethargy. Initial encounter. EXAM: MRI HEAD WITHOUT CONTRAST TECHNIQUE: Multiplanar, multiecho pulse sequences of the brain and surrounding structures were obtained without intravenous contrast. COMPARISON:  None. FINDINGS: Mildly heterogeneous diffusion weighted imaging, including in the brainstem (series 3, image 21), but no convincing No restricted diffusion or evidence of acute infarction. Major intracranial vascular flow voids are preserved, with generalized intracranial artery dolichoectasia. 3.7 cm CSF isointense simple appearing fluid collection in the left middle cranial fossa most compatible with arachnoid cyst (series 7, image 9 and series 5, image 18). No other extra-axial collection. No other intracranial mass effect. No midline shift. No ventriculomegaly, or acute intracranial hemorrhage. Cervicomedullary junction and pituitary are within normal limits. Negative for age visualized cervical spine. Patchy and confluent cerebral white matter T2 and FLAIR hyperintensity, including involvement of the deep white matter capsules. T2 heterogeneity in the deep gray matter nuclei which appears mostly related to perivascular spaces. Patchy T2 hyperintensity in the pons. Mild increased perivascular spaces in the deep cerebellar nuclei. No cortical  encephalomalacia or chronic cerebral blood products. Visible internal auditory structures appear normal. Mastoids are clear. There is a fluid level in the right maxillary sinus. Suggestion of superimposed right maxillary sinus mucous retention cysts as well as chronic periostitis. There is otherwise mild ethmoid and right frontal sinus mucosal thickening. Negative orbit and scalp soft tissues. Visualized bone marrow signal is within normal limits. IMPRESSION: 1.  No acute intracranial abnormality. 2. 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 3. Left middle cranial fossa arachnoid cyst, most likely congenital and inconsequential. 4. Acute on chronic appearing right maxillary sinusitis. Electronically Signed   By: Genevie Ann M.D.   On: 09/08/2015 20:49   Dg Chest Port 1 View  09/08/2015  CLINICAL DATA:  Pneumonia. EXAM: PORTABLE CHEST 1 VIEW COMPARISON:  09/03/2015 chest radiograph. FINDINGS: Stable cardiomediastinal silhouette with normal heart size. No pneumothorax. No pleural effusion. Mild patchy opacity in the basilar right lower lobe. No pulmonary edema . IMPRESSION: Mild patchy basilar right lower lobe opacity, which could represent atelectasis, aspiration or pneumonia.  Recommend follow-up PA and lateral post treatment chest radiographs in 4-6 weeks. Electronically Signed   By: Ilona Sorrel M.D.   On: 09/08/2015 13:16   Dg Chest Port 1 View  09/03/2015  CLINICAL DATA:  Patient with wheezing and shortness of breath. EXAM: PORTABLE CHEST 1 VIEW COMPARISON:  Chest radiograph 05/22/2014. FINDINGS: Multiple monitoring leads overlie the patient. Stable cardiac and mediastinal contours with tortuosity of the thoracic aorta. Interval development of bilateral mid and lower lung heterogeneous opacities. No pleural effusion or pneumothorax. IMPRESSION: Bilateral mid and lower lung  heterogeneous opacities favored to represent atelectasis. Infection not excluded. Electronically Signed   By: Lovey Newcomer M.D.   On: 09/03/2015 18:23   Dg Abd Portable 1v  09/13/2015  CLINICAL DATA:  Evidence of ileus on CT scan of September 12, 2015 EXAM: PORTABLE ABDOMEN - 1 VIEW COMPARISON:  CT scan of September 12, 2015 FINDINGS: There is a moderate amount of gas within the stomach. There are small amounts of gas within the small bowel located to the left midline. There is gas within the transverse and descending colon. No significant gas collections are noted in the right lower quadrant. There surgical clips in the gallbladder fossa. There is moderate to severe degenerative disc disease of the lumbar spine. There prosthetic hip joints bilaterally. IMPRESSION: No evidence of bowel obstruction or ileus. No acute intra-abdominal abnormality is observed. Electronically Signed   By: David  Martinique M.D.   On: 09/13/2015 12:16    Microbiology: Recent Results (from the past 240 hour(s))  Culture, blood (routine x 2)     Status: None   Collection Time: 09/09/15 10:40 AM  Result Value Ref Range Status   Specimen Description BLOOD RIGHT ANTECUBITAL  Final   Special Requests BOTTLES DRAWN AEROBIC ONLY 5CC  Final   Culture NO GROWTH 5 DAYS  Final   Report Status 09/14/2015 FINAL  Final  Culture, blood (routine x 2)     Status: None   Collection Time: 09/09/15 10:45 AM  Result Value Ref Range Status   Specimen Description BLOOD RIGHT HAND  Final   Special Requests BOTTLES DRAWN AEROBIC ONLY 5CC  Final   Culture NO GROWTH 5 DAYS  Final   Report Status 09/14/2015 FINAL  Final  C difficile quick scan w PCR reflex     Status: None   Collection Time: 09/09/15 11:11 AM  Result Value Ref Range Status   C Diff antigen NEGATIVE NEGATIVE Final   C Diff toxin NEGATIVE NEGATIVE Final   C Diff interpretation Negative for toxigenic C. difficile  Final     Labs: Basic Metabolic Panel:  Recent Labs Lab 09/10/15 1419  09/11/15 0823 09/13/15 1210 09/14/15 0500 09/15/15 0536 09/16/15 0540  NA 141 143 137 136 136 135  K 3.7 3.5 2.9* 3.2* 3.2* 3.8  CL 110 112* 103 102 104 103  CO2 '24 23 26 22 23 24  '$ GLUCOSE 118* 127* 96 98 102* 99  BUN 50* 26* '8 6 7 9  '$ CREATININE 1.48* 1.39* 1.15* 1.20* 1.08* 1.06*  CALCIUM 8.3* 8.2* 8.3* 8.1* 8.6* 8.9  MG 2.4  --   --  1.6*  --   --    Liver Function Tests: No results for input(s): AST, ALT, ALKPHOS, BILITOT, PROT, ALBUMIN in the last 168 hours. No results for input(s): LIPASE, AMYLASE in the last 168 hours. No results for input(s): AMMONIA in the last 168 hours. CBC:  Recent Labs Lab 09/12/15 0508 09/12/15 1951 09/13/15 1210  09/14/15 0500 09/15/15 0536 09/16/15 0540  WBC 29.7*  --  31.6* 29.5* 26.4* 19.9*  HGB 7.5* 9.3* 9.5* 9.8* 10.3* 10.3*  HCT 23.1* 28.1* 29.4* 29.0* 31.2* 32.1*  MCV 85.6  --  85.5 86.1 87.6 88.2  PLT 413*  --  339 427* 418* 416*   Cardiac Enzymes: No results for input(s): CKTOTAL, CKMB, CKMBINDEX, TROPONINI in the last 168 hours. BNP: BNP (last 3 results) No results for input(s): BNP in the last 8760 hours.  ProBNP (last 3 results) No results for input(s): PROBNP in the last 8760 hours.  CBG:  Recent Labs Lab 09/15/15 1208 09/15/15 1643 09/15/15 2146 09/16/15 0737 09/16/15 1222  GLUCAP 125* 95 102* 100* 150*       Signed:  Aleck Locklin MD.  [facility] 09/16/2015, 2:10 PM

## 2015-09-16 NOTE — Progress Notes (Signed)
Pt and daughter given discharge instructions, prescriptions, and care notes. Pt verbalized understanding AEB no further questions or concerns at this time. IV was discontinued, no redness, pain, or swelling noted at this time. Telemetry discontinued and Centralized Telemetry was notified. Pt left the floor via wheelchair with staff in stable condition. 

## 2015-09-16 NOTE — Progress Notes (Signed)
Physical Therapy Treatment Patient Details Name: [name] MRN: [id number] DOB: 1933-01-28 Today's Date: 09/16/2015    History of Present Illness [name] is a 82 y.o. female with medical history significant of CKD , HTN spinal stenosis, GERD, HL, Asthma who presented with dyspnea, wheezing and hypoxia.    PT Comments    Patient is progressing well toward mobility goals. Tolerated stair training well with SpO2 94% on RA and no SOB. Led through therex. Current plan remains appropriate.   Follow Up Recommendations  Home health PT;Supervision/Assistance - 24 hour     Equipment Recommendations  None recommended by PT    Recommendations for Other Services       Precautions / Restrictions Precautions Precautions: Fall Restrictions Weight Bearing Restrictions: No    Mobility  Bed Mobility Overal bed mobility: Modified Independent             General bed mobility comments: increased time  Transfers Overall transfer level: Modified independent Equipment used: Rolling walker (2 wheeled) Transfers: Sit to/from Stand           General transfer comment: safe hand placement   Ambulation/Gait Ambulation/Gait assistance: Supervision Ambulation Distance (Feet): 225 Feet Assistive device: Rolling walker (2 wheeled);None Gait Pattern/deviations: Step-through pattern;Decreased stride length;Trunk flexed     General Gait Details: cues for posture and position of RW   Stairs Stairs: Yes Stairs assistance: Min guard Stair Management: One rail Right;Forwards Number of Stairs: 5 General stair comments: educated on sequencing; min guard for safety; SpO2 94% on RA after stair training  Wheelchair Mobility    Modified Rankin (Stroke Patients Only)       Balance Overall balance assessment: Needs assistance   Sitting balance-Leahy Scale: Good       Standing balance-Leahy Scale: Fair                      Cognition Arousal/Alertness:  Awake/alert Behavior During Therapy: WFL for tasks assessed/performed Overall Cognitive Status: Within Functional Limits for tasks assessed                      Exercises General Exercises - Lower Extremity Long Arc Quad: Strengthening;Both;10 reps;Seated Hip ABduction/ADduction: Strengthening;Both;10 reps;Seated Straight Leg Raises: Strengthening;Both;5 reps;Seated Hip Flexion/Marching: Strengthening;Both;10 reps;Seated Toe Raises: Strengthening;Both;10 reps;Seated Heel Raises: Strengthening;Both;10 reps;Seated    General Comments        Pertinent Vitals/Pain Pain Assessment: Faces Faces Pain Scale: Hurts a little bit Pain Location: head Pain Descriptors / Indicators: Aching Pain Intervention(s): Monitored during session    Home Living                      Prior Function            PT Goals (current goals can now be found in the care plan section) Acute Rehab PT Goals Patient Stated Goal: go home Progress towards PT goals: Progressing toward goals    Frequency  Min 3X/week    PT Plan Current plan remains appropriate    Co-evaluation             End of Session Equipment Utilized During Treatment: Gait belt Activity Tolerance: Patient tolerated treatment well Patient left: in chair;with call bell/phone within reach;with chair alarm set     Time: [id number] PT Time Calculation (min) (ACUTE ONLY): 31 min  Charges:  $Gait Training: 8-22 mins $Therapeutic Exercise: 8-22 mins  G Codes:      Salina April, PTA Pager: [phone]   09/16/2015, 12:35 PM

## 2015-09-16 NOTE — Care Management Important Message (Signed)
Important Message  Patient Details  Name: [name] MRN: [id number] Date of Birth: 1932/08/04   Medicare Important Message Given:  Yes    Emmalise Huard Abena 09/16/2015, 12:14 PM

## 2015-09-16 NOTE — Progress Notes (Signed)
   09/16/15 1000  Clinical Encounter Type  Visited With Patient  Visit Type Initial  Spiritual Encounters  Spiritual Needs Emotional  Stress Factors  Patient Stress Factors None identified  Family Stress Factors None identified   Chaplain visited with patient while doing rounds. Patient in a good mood and ready to go home.  Patient was enjoying breakfast and a cup of coffee.  Chaplain offered ministry of presence to patient.  Vilinda Blanks Jerzy Roepke 09/16/2015 10:06 AM

## 2015-09-19 ENCOUNTER — Encounter: Payer: Self-pay | Admitting: Family Medicine

## 2015-09-19 DIAGNOSIS — K922 Gastrointestinal hemorrhage, unspecified: Secondary | ICD-10-CM | POA: Insufficient documentation

## 2015-09-21 DIAGNOSIS — N183 Chronic kidney disease, stage 3 (moderate): Secondary | ICD-10-CM | POA: Diagnosis not present

## 2015-09-21 DIAGNOSIS — K269 Duodenal ulcer, unspecified as acute or chronic, without hemorrhage or perforation: Secondary | ICD-10-CM | POA: Diagnosis not present

## 2015-09-21 DIAGNOSIS — K922 Gastrointestinal hemorrhage, unspecified: Secondary | ICD-10-CM | POA: Diagnosis not present

## 2015-09-21 DIAGNOSIS — J441 Chronic obstructive pulmonary disease with (acute) exacerbation: Secondary | ICD-10-CM | POA: Diagnosis not present

## 2015-09-21 DIAGNOSIS — A419 Sepsis, unspecified organism: Secondary | ICD-10-CM | POA: Diagnosis not present

## 2015-09-21 DIAGNOSIS — F329 Major depressive disorder, single episode, unspecified: Secondary | ICD-10-CM | POA: Diagnosis not present

## 2015-09-21 DIAGNOSIS — E785 Hyperlipidemia, unspecified: Secondary | ICD-10-CM | POA: Diagnosis not present

## 2015-09-21 DIAGNOSIS — I129 Hypertensive chronic kidney disease with stage 1 through stage 4 chronic kidney disease, or unspecified chronic kidney disease: Secondary | ICD-10-CM | POA: Diagnosis not present

## 2015-09-21 DIAGNOSIS — D62 Acute posthemorrhagic anemia: Secondary | ICD-10-CM | POA: Diagnosis not present

## 2015-09-21 DIAGNOSIS — Z7951 Long term (current) use of inhaled steroids: Secondary | ICD-10-CM | POA: Diagnosis not present

## 2015-09-21 DIAGNOSIS — M4800 Spinal stenosis, site unspecified: Secondary | ICD-10-CM | POA: Diagnosis not present

## 2015-09-21 DIAGNOSIS — D72829 Elevated white blood cell count, unspecified: Secondary | ICD-10-CM | POA: Diagnosis not present

## 2015-09-21 DIAGNOSIS — J690 Pneumonitis due to inhalation of food and vomit: Secondary | ICD-10-CM | POA: Diagnosis not present

## 2015-09-21 DIAGNOSIS — Z9181 History of falling: Secondary | ICD-10-CM | POA: Diagnosis not present

## 2015-09-23 DIAGNOSIS — D62 Acute posthemorrhagic anemia: Secondary | ICD-10-CM | POA: Diagnosis not present

## 2015-09-23 DIAGNOSIS — A419 Sepsis, unspecified organism: Secondary | ICD-10-CM | POA: Diagnosis not present

## 2015-09-23 DIAGNOSIS — K922 Gastrointestinal hemorrhage, unspecified: Secondary | ICD-10-CM | POA: Diagnosis not present

## 2015-09-23 DIAGNOSIS — N183 Chronic kidney disease, stage 3 (moderate): Secondary | ICD-10-CM | POA: Diagnosis not present

## 2015-09-23 DIAGNOSIS — K269 Duodenal ulcer, unspecified as acute or chronic, without hemorrhage or perforation: Secondary | ICD-10-CM | POA: Diagnosis not present

## 2015-09-23 DIAGNOSIS — I129 Hypertensive chronic kidney disease with stage 1 through stage 4 chronic kidney disease, or unspecified chronic kidney disease: Secondary | ICD-10-CM | POA: Diagnosis not present

## 2015-09-23 DIAGNOSIS — Z9181 History of falling: Secondary | ICD-10-CM | POA: Diagnosis not present

## 2015-09-23 DIAGNOSIS — Z7951 Long term (current) use of inhaled steroids: Secondary | ICD-10-CM | POA: Diagnosis not present

## 2015-09-23 DIAGNOSIS — E785 Hyperlipidemia, unspecified: Secondary | ICD-10-CM | POA: Diagnosis not present

## 2015-09-23 DIAGNOSIS — J441 Chronic obstructive pulmonary disease with (acute) exacerbation: Secondary | ICD-10-CM | POA: Diagnosis not present

## 2015-09-23 DIAGNOSIS — F329 Major depressive disorder, single episode, unspecified: Secondary | ICD-10-CM | POA: Diagnosis not present

## 2015-09-23 DIAGNOSIS — D72829 Elevated white blood cell count, unspecified: Secondary | ICD-10-CM | POA: Diagnosis not present

## 2015-09-23 DIAGNOSIS — M4800 Spinal stenosis, site unspecified: Secondary | ICD-10-CM | POA: Diagnosis not present

## 2015-09-23 DIAGNOSIS — J690 Pneumonitis due to inhalation of food and vomit: Secondary | ICD-10-CM | POA: Diagnosis not present

## 2015-09-27 ENCOUNTER — Ambulatory Visit (INDEPENDENT_AMBULATORY_CARE_PROVIDER_SITE_OTHER): Payer: Medicare Other | Admitting: Family Medicine

## 2015-09-27 ENCOUNTER — Encounter: Payer: Self-pay | Admitting: Family Medicine

## 2015-09-27 VITALS — BP 152/84 | HR 86 | Temp 98.2°F | Resp 18 | Wt 153.0 lb

## 2015-09-27 DIAGNOSIS — J4531 Mild persistent asthma with (acute) exacerbation: Secondary | ICD-10-CM | POA: Diagnosis not present

## 2015-09-27 DIAGNOSIS — Z09 Encounter for follow-up examination after completed treatment for conditions other than malignant neoplasm: Secondary | ICD-10-CM | POA: Diagnosis not present

## 2015-09-27 DIAGNOSIS — Z79899 Other long term (current) drug therapy: Secondary | ICD-10-CM | POA: Diagnosis not present

## 2015-09-27 DIAGNOSIS — K264 Chronic or unspecified duodenal ulcer with hemorrhage: Secondary | ICD-10-CM

## 2015-09-27 MED ORDER — ATORVASTATIN CALCIUM 20 MG PO TABS: 20.0000 mg | ORAL_TABLET | Freq: Every day | ORAL | Status: DC

## 2015-09-27 MED ORDER — HYDROCODONE-ACETAMINOPHEN 5-325 MG PO TABS: 0.5000 | ORAL_TABLET | Freq: Four times a day (QID) | ORAL | Status: DC | PRN

## 2015-09-27 NOTE — Progress Notes (Signed)
Subjective:    Patient ID: [name], female    DOB: 08/03/1932, 82 y.o.   MRN: [id number]  HPI  Recently admitted to the hospital for shortness of breath secondary to asthma exacerbation complicated by aspiration pneumonia. Also developed an upper GI bleed secondary to duodenal ulcers while in the hospital. I have copied relevant portions of the discharge summary and included them below for my reference:  Admit date: 09/03/2015 Discharge date: 09/16/2015  Time spent:45 minutes  Recommendations for Outpatient Follow-up:  1. PCP in 1 week   Discharge Diagnoses:   Asthma exacerbation  Upper Gi bleed  Duodenal ulcer  Sepsis  Aspiration pneumonia  CKD (chronic kidney disease) stage 3, GFR 30-59 ml/min  Hypoxemia  Hypokalemia  Asthma exacerbation  Melena  Acute blood loss anemia  Duodenal ulcer hemorrhage  Leukocytosis   Discharge Condition: stable  Diet recommendation: low sodium  Filed Weights   09/09/15 1619 09/10/15 0427 09/14/15 1606  Weight: 73.2 kg (161 lb 6 oz) 74 kg (163 lb 2.3 oz) 69.4 kg (153 lb)    History of present illness:  82 y.o. female with past medical history significant for hypertension, spinal stenosis, dyslipidemia, asthma. Patient presented to [facility] with worsening shortness of breath over past 3 days prior to this admission. She used at home inhalers with no significant symptomatic relief. She initially presented to urgent care and found to be hypoxic around 80s and she was referred to emergency department for further evaluation  Hospital Course:   Acute asthma exacerbation -improved, resolved  Acute blood loss anemia / Acute upper GI bleed secondary to duodenal bulb hemorrhage / Multiple duodenal ulcers one with visible vessel - developed blood and tarry stools after admission overnight on 6/21 -Gi was consulted, she had an EGD on 6/23 - EGD 09/09/2015 - LA Grade D reflux esophagitis. Multiple  non-obstructing duodenal ulcers, 1 with a visible vessel. H/Pylori negative. - s/p injection and Endo Clipping of ulcer with visible vessel, was on IV PPI, now changed to PO - s/p 2 units PRBC transfusion, no further bleeding now - CT abdomen pelvis without findings of intraabdominal bleed - GI signed off on 09/12/2015  Sepsis, unspecified organism / Aspiration pneumonia - Hospitalization was also complicated by Sepsis/aspiration pnuemonia - leukocytosis initially thought to be secondary to steroids.  - Then sepsis criteria met on 6/22. WBC count up to 43.6. RR was 14-20 and HR 74-105 - CXR with RLL pneumonia and she was started on zosyn 6/23. She was also given PO vanco 6/23 for possible C.diff colitis but stopped 09/10/2015 because C. difficile PCR negative - Blood cultures so far show no growth - Leukocytosis is improving,afebrile now - Day 6 of Zosyn, and 2days of augmentin, improving, Abx stopped - Per SLP study - thin liquids, meds with pure.  -leukocytosis continues to improve from 43K at peak to 19K now  Ileus -resolved -tolerating Po and Having loose stools  Diarrhea -due to Abx, resolving ileus -Cdiff PCR negative -improving  Acute respiratory failure with hypoxia /Acute Asthma exacerbation  -improved, had Asthma exacerbation and Asp PNA  Acute metabolic encephalopathy - due to Sepsis/Aspiration pneumonia - MRI with no acute intracranial findings - improved back to baseline  Accelerated hypertension / Essential hypertension - Continue Norvasc and metoprolol   Chronic kidney disease stage III - Baseline creatinine 1.4 about 10 months ago - Creatinine on this admission 1.09 and up to 1.4, still within baseline range   Hypokalemia - Likely secondary  to nebulizer treatments - Potassium subsequently within normal limits  Depression - held celexa 6/23 due to lethargy - resumed now  Dyslipidemia - Continue Lipitor 20 mg daily   Steroid induced  hyperglycemia - A1c is 5.6 - Continue sliding scale insulin - CBGs in past 24 hours:114, 125, 124  Recent falls - PT evaluation - HHPT recommended   Consultants:   Physical therapy   Pulmonary - respiratory team   Gastroenterology  Procedures:   EGD by Dr. Hilarie Fredrickson 09/09/2015 - LA Grade D reflux esophagitis. No gross lesions in the stomach. Blood in the duodenal bulb and in the second portion of the duodenum. Multiple non-obstructing duodenal ulcers, 1 with a visible vessel. Injected.   Protonix infusion 6/23 -->  1 U PRBC 09/10/2015   1 U PRBC 09/12/2015     Since discharge from the hospital, her daughter is very concerned about memory loss. Patient is becoming very forgetful. She is forgetting the names of family members including spouse is a sons and daughters. She is also becoming confused particularly at night. She was sundowning in the hospital. She is not taking any iron. She is taking a proton pump inhibitor twice a day. Her breathing has improved. She denies any fevers or chills or coughing. She denies any black tarry stools or blood in her stool. She denies any fevers or chills. However she has lost significant weight since last time I saw her. She is still not eating well. She has poor appetite. Past Medical History  Diagnosis Date  . Hypercholesterolemia   . Hypertension   . Mitral valve prolapse   . Mild acid reflux   . Staph infection      history of a wound infection following a previous right hip surgery  . CKD (chronic kidney disease) stage 3, GFR 30-59 ml/min   . Spinal stenosis   . Renal insufficiency   . Upper GI bleed     duodenal ulcer (09/2015) s/p endo clipping   Past Surgical History  Procedure Laterality Date  . Right breast lumpectomy    . Left total hip replacement arthroplasty  June of 2001  . Right total hip replacement arthroplasty  August of 2001  . Abcess drainage    . Colonoscopy  04/30/01    [id number] hemorrhoids, otherwise,  normal rectum/colon/Normal terminal ileum  . Esophagogastroduodenoscopy    03/01/2007    [id number] esophagus without evidence of Barrett's, mass/Mild erythema in the antrum.  Distorted pylorus consistent with prior ulcer disease.  Biopsies obtained/ 3. Junction of D1 and D2 erythematous, edematous, and strictured.  The lumen was narrowed to approximately 10 mm.   . Colonoscopy  03/02/2007    [id number] sigmoid colon diverticula.  Otherwise no polyps, masses/Normal terminal ileum.  Approximately 10-15 cm of the distal terminal ileum visualized/normal view of the rectum  . Total knee arthroplasty    . Abdominal hysterectomy    . Esophagogastroduodenoscopy N/A 09/09/2015    Procedure: ESOPHAGOGASTRODUODENOSCOPY (EGD);  Surgeon: Jerene Bears, MD;  Location: [facility] ENDOSCOPY;  Service: Endoscopy;  Laterality: N/A;   Current Outpatient Prescriptions on File Prior to Visit  Medication Sig Dispense Refill  . albuterol (PROVENTIL HFA;VENTOLIN HFA) 108 (90 BASE) MCG/ACT inhaler Inhale 2 puffs into the lungs every 4 (four) hours as needed for wheezing or shortness of breath. 1 Inhaler 0  . albuterol (PROVENTIL) (2.5 MG/3ML) 0.083% nebulizer solution Take 3 mLs (2.5 mg total) by nebulization every 6 (six) hours as needed for wheezing or shortness of breath.  150 mL 0  . amLODipine (NORVASC) 10 MG tablet Take 1 tablet (10 mg total) by mouth daily. 90 tablet 1  . Biotin 2500 MCG CAPS Take 2,500 mg by mouth daily.    . butalbital-aspirin-caffeine (FIORINAL) 50-325-40 MG per capsule TAKE ONE CAPSULE BY MOUTH EVERY 8 HOURS AS NEEDED FOR HEADACHE 30 capsule 0  . cetirizine (ZYRTEC) 10 MG tablet Take 10 mg by mouth daily.    . citalopram (CELEXA) 20 MG tablet TAKE 1 TABLET DAILY 90 tablet 3  . clotrimazole-betamethasone (LOTRISONE) cream Apply 1 application topically 2 (two) times daily. (Patient taking differently: Apply 1 application topically 2 (two) times daily as needed (for irritation). ) 30 g 0  . fish oil-omega-3  fatty acids 1000 MG capsule Take 2 g by mouth daily.    Marland Kitchen gabapentin (NEURONTIN) 300 MG capsule Take 1 capsule (300 mg total) by mouth at bedtime.    Marland Kitchen losartan-hydrochlorothiazide (HYZAAR) 50-12.5 MG tablet Take 1 tablet by mouth daily. 30 tablet 11  . metoprolol tartrate (LOPRESSOR) 25 MG tablet Take 1 tablet (25 mg total) by mouth 2 (two) times daily. 60 tablet 0  . montelukast (SINGULAIR) 10 MG tablet Take 1 tablet (10 mg total) by mouth at bedtime. 30 tablet 3  . Multiple Vitamin (MULTIVITAMIN) capsule Take 1 capsule by mouth daily.    . pantoprazole (PROTONIX) 40 MG tablet Take 1 tablet (40 mg total) by mouth 2 (two) times daily. For 4weeks then daily 60 tablet 1  . vitamin B-12 (CYANOCOBALAMIN) 500 MCG tablet Take 500 mcg by mouth daily.     No current facility-administered medications on file prior to visit.   Allergies  Allergen Reactions  . Demerol [Meperidine] Other (See Comments)    Altered mental status  . Meperidine Hcl Other (See Comments)    Altered mental status   Social History   Social History  . Marital Status: Widowed    Spouse Name: N/A  . Number of Children: N/A  . Years of Education: N/A   Occupational History  . Not on file.   Social History Main Topics  . Smoking status: Never Smoker   . Smokeless tobacco: Never Used  . Alcohol Use: No  . Drug Use: No  . Sexual Activity: No     Comment: widowed   Other Topics Concern  . Not on file   Social History Narrative     Review of Systems  All other systems reviewed and are negative.      Objective:   Physical Exam  Constitutional: She appears well-developed and well-nourished.  Neck: No JVD present.  Cardiovascular: Normal rate, regular rhythm and normal heart sounds.   No murmur heard. Pulmonary/Chest: Effort normal and breath sounds normal. No respiratory distress. She has no wheezes. She has no rales.  Abdominal: Soft. Bowel sounds are normal. She exhibits no distension. There is no  tenderness. There is no rebound and no guarding.  Musculoskeletal: She exhibits no edema.  Lymphadenopathy:    She has no cervical adenopathy.  Vitals reviewed.         Assessment & Plan:  Hospital discharge follow-up - Plan: CBC with Differential/Platelet, COMPLETE METABOLIC PANEL WITH GFR  Asthma with exacerbation, mild persistent  Gastrointestinal hemorrhage associated with duodenal ulcer  Recheck a CBC to monitor her anemia. Recommended iron sulfate 325 mg by mouth twice a day. Recommended in short 2 cans per day to increase her protein consumption and correct her protein calorie malnutrition. Her blood pressures elevated  today but I hesitate to make any changes given how weak she appears. Recheck blood pressure in 2 weeks. In 2 weeks I would also perform a Mini-Mental status exam. Hopefully that time the patient is closer to her baseline and we will get a more accurate assessment of her underlying mental status. She very well could be developing some mild dementia. Recommended avoidance of all NSAIDs area she can use Tylenol and/or hydrocodone for pain.

## 2015-09-28 ENCOUNTER — Telehealth: Payer: Self-pay | Admitting: Internal Medicine

## 2015-09-28 ENCOUNTER — Encounter (HOSPITAL_COMMUNITY): Payer: Self-pay

## 2015-09-28 ENCOUNTER — Emergency Department (HOSPITAL_COMMUNITY)
Admission: EM | Admit: 2015-09-28 | Discharge: 2015-09-29 | Disposition: A | Discharge status: Home / Self Care | Payer: Medicare Other | Attending: Emergency Medicine | Admitting: Emergency Medicine

## 2015-09-28 DIAGNOSIS — Z96643 Presence of artificial hip joint, bilateral: Secondary | ICD-10-CM | POA: Insufficient documentation

## 2015-09-28 DIAGNOSIS — K922 Gastrointestinal hemorrhage, unspecified: Secondary | ICD-10-CM | POA: Diagnosis not present

## 2015-09-28 DIAGNOSIS — M4800 Spinal stenosis, site unspecified: Secondary | ICD-10-CM | POA: Diagnosis not present

## 2015-09-28 DIAGNOSIS — J690 Pneumonitis due to inhalation of food and vomit: Secondary | ICD-10-CM | POA: Diagnosis not present

## 2015-09-28 DIAGNOSIS — D649 Anemia, unspecified: Secondary | ICD-10-CM | POA: Insufficient documentation

## 2015-09-28 DIAGNOSIS — I129 Hypertensive chronic kidney disease with stage 1 through stage 4 chronic kidney disease, or unspecified chronic kidney disease: Secondary | ICD-10-CM | POA: Insufficient documentation

## 2015-09-28 DIAGNOSIS — J441 Chronic obstructive pulmonary disease with (acute) exacerbation: Secondary | ICD-10-CM | POA: Diagnosis not present

## 2015-09-28 DIAGNOSIS — N183 Chronic kidney disease, stage 3 (moderate): Secondary | ICD-10-CM | POA: Diagnosis not present

## 2015-09-28 DIAGNOSIS — E785 Hyperlipidemia, unspecified: Secondary | ICD-10-CM | POA: Diagnosis not present

## 2015-09-28 DIAGNOSIS — R5383 Other fatigue: Secondary | ICD-10-CM | POA: Diagnosis present

## 2015-09-28 DIAGNOSIS — D72829 Elevated white blood cell count, unspecified: Secondary | ICD-10-CM | POA: Diagnosis not present

## 2015-09-28 DIAGNOSIS — Z9181 History of falling: Secondary | ICD-10-CM | POA: Diagnosis not present

## 2015-09-28 DIAGNOSIS — A419 Sepsis, unspecified organism: Secondary | ICD-10-CM | POA: Diagnosis not present

## 2015-09-28 DIAGNOSIS — Z7951 Long term (current) use of inhaled steroids: Secondary | ICD-10-CM | POA: Diagnosis not present

## 2015-09-28 DIAGNOSIS — F329 Major depressive disorder, single episode, unspecified: Secondary | ICD-10-CM | POA: Diagnosis not present

## 2015-09-28 DIAGNOSIS — Z96659 Presence of unspecified artificial knee joint: Secondary | ICD-10-CM | POA: Insufficient documentation

## 2015-09-28 DIAGNOSIS — D62 Acute posthemorrhagic anemia: Secondary | ICD-10-CM | POA: Diagnosis not present

## 2015-09-28 DIAGNOSIS — K269 Duodenal ulcer, unspecified as acute or chronic, without hemorrhage or perforation: Secondary | ICD-10-CM | POA: Diagnosis not present

## 2015-09-28 LAB — COMPLETE METABOLIC PANEL WITH GFR
ALT: 15 U/L (ref 6–29)
AST: 12 U/L (ref 10–35)
Albumin: 3.3 g/dL — ABNORMAL LOW (ref 3.6–5.1)
Alkaline Phosphatase: 87 U/L (ref 33–130)
BUN: 16 mg/dL (ref 7–25)
CALCIUM: 8.8 mg/dL (ref 8.6–10.4)
CHLORIDE: 99 mmol/L (ref 98–110)
CO2: 24 mmol/L (ref 20–31)
Creat: 1.07 mg/dL — ABNORMAL HIGH (ref 0.60–0.88)
GFR, Est African American: 56 mL/min — ABNORMAL LOW (ref >=60)
GFR, Est Non African American: 49 mL/min — ABNORMAL LOW (ref >=60)
Glucose, Bld: 97 mg/dL (ref 70–99)
POTASSIUM: 3.9 mmol/L (ref 3.5–5.3)
SODIUM: 140 mmol/L (ref 135–146)
Total Bilirubin: 0.3 mg/dL (ref 0.2–1.2)
Total Protein: 6 g/dL — ABNORMAL LOW (ref 6.1–8.1)

## 2015-09-28 LAB — CBC
HCT: 28.1 % — ABNORMAL LOW (ref 36.0–46.0)
Hemoglobin: 8.6 g/dL — ABNORMAL LOW (ref 12.0–15.0)
MCH: 26.6 pg (ref 26.0–34.0)
MCHC: 30.6 g/dL (ref 30.0–36.0)
MCV: 87 fL (ref 78.0–100.0)
PLATELETS: 438 10*3/uL — AB (ref 150–400)
RBC: 3.23 MIL/uL — ABNORMAL LOW (ref 3.87–5.11)
RDW: 15.6 % — AB (ref 11.5–15.5)
WBC: 8.6 10*3/uL (ref 4.0–10.5)

## 2015-09-28 LAB — CBC WITH DIFFERENTIAL/PLATELET
BASOS ABS: 0 {cells}/uL (ref 0–200)
Basophils Relative: 0 %
EOS ABS: 624 {cells}/uL — AB (ref 15–500)
EOS PCT: 8 %
HEMATOCRIT: 27.2 % — AB (ref 35.0–45.0)
Hemoglobin: 8.7 g/dL — CL (ref 12.0–15.0)
LYMPHS PCT: 18 %
Lymphs Abs: 1404 cells/uL (ref 850–3900)
MCH: 27.6 pg (ref 27.0–33.0)
MCHC: 32 g/dL (ref 32.0–36.0)
MCV: 86.3 fL (ref 80.0–100.0)
MPV: 10 fL (ref 7.5–12.5)
Monocytes Absolute: 858 cells/uL (ref 200–950)
Monocytes Relative: 11 %
NEUTROS ABS: 4914 {cells}/uL (ref 1500–7800)
NEUTROS PCT: 63 %
Platelets: 447 10*3/uL — ABNORMAL HIGH (ref 140–400)
RBC: 3.15 MIL/uL — AB (ref 3.80–5.10)
RDW: 15.7 % — AB (ref 11.0–15.0)
WBC: 7.8 10*3/uL (ref 3.8–10.8)

## 2015-09-28 LAB — TYPE AND SCREEN
ABO/RH(D): O POS
Antibody Screen: NEGATIVE

## 2015-09-28 LAB — COMPREHENSIVE METABOLIC PANEL
ALBUMIN: 2.8 g/dL — AB (ref 3.5–5.0)
ALK PHOS: 84 U/L (ref 38–126)
ALT: 21 U/L (ref 14–54)
AST: 21 U/L (ref 15–41)
Anion gap: 9 (ref 5–15)
BUN: 15 mg/dL (ref 6–20)
CALCIUM: 9.1 mg/dL (ref 8.9–10.3)
CO2: 25 mmol/L (ref 22–32)
CREATININE: 1.17 mg/dL — AB (ref 0.44–1.00)
Chloride: 99 mmol/L — ABNORMAL LOW (ref 101–111)
GFR calc non Af Amer: 43 mL/min — ABNORMAL LOW (ref >60)
GFR, EST AFRICAN AMERICAN: 49 mL/min — AB (ref >60)
GLUCOSE: 111 mg/dL — AB (ref 65–99)
Potassium: 4.1 mmol/L (ref 3.5–5.1)
SODIUM: 133 mmol/L — AB (ref 135–145)
Total Bilirubin: 0.5 mg/dL (ref 0.3–1.2)
Total Protein: 6.4 g/dL — ABNORMAL LOW (ref 6.5–8.1)

## 2015-09-28 LAB — POC OCCULT BLOOD, ED: Fecal Occult Bld: NEGATIVE

## 2015-09-28 MED ORDER — ACETAMINOPHEN 325 MG PO TABS
650.0000 mg | ORAL_TABLET | Freq: Once | ORAL | Status: AC
  Administered 2015-09-28: 650 mg via ORAL

## 2015-09-28 MED ORDER — ACETAMINOPHEN 325 MG PO TABS
ORAL_TABLET | ORAL | Status: AC
  Filled 2015-09-28: qty 2

## 2015-09-28 NOTE — ED Notes (Signed)
Assisted provider with rectal exam

## 2015-09-28 NOTE — ED Notes (Signed)
MD at bedside to discuss plan of care

## 2015-09-28 NOTE — Telephone Encounter (Signed)
Contacted by Dr. Buelah Manis at [facility] regarding [name] She was seen by my in the hospital and had EGD on 09/09/15.  Found to have severe esophagitis as well as bleeding duodenal ulcer (treated with injection and clip) Was hospitalized another week, developed an ileus and then was discharged on 09/16/15 She was seen today for hospital follow-up. Dr. Buelah Manis was concerned about ongoing bleeding based on her visit there today and also declining hemoglobin, down 2 g since discharge My recommendation was that she be sent back to the hospital for further evaluation and likely admission. She may require repeat endoscopy given recent upper GI bleeding Continue twice a day PPI I will cc: Dr. Loletha Carrow who is currently the GI hospital MD for our group should she be admitted

## 2015-09-28 NOTE — ED Notes (Signed)
Patient here after being called by nurse today from labs collected yesterday.  Was informed that Hgb down from previous labs. Had recent endoscopy and ulcers were clamped off. Patient has no pain, no dark or bloody stools per patient and family, no emesis.

## 2015-09-28 NOTE — Discharge Instructions (Signed)
Call your doctor or return to the ER immediately if you notice black or bloody stools, abdominal pain, vomiting with blood, or worsening in your fatigue/weakness. Currently her hemoglobin is stable from yesterday. Call your doctor and be seen on Friday for a recheck in hemoglobin.

## 2015-09-28 NOTE — ED Provider Notes (Signed)
CSN: [id number]     Arrival date & time 09/28/15  1745 History  By signing my name below, I, Evelene Croon, attest that this documentation has been prepared under the direction and in the presence of Sherwood Gambler, MD . Electronically Signed: Evelene Croon, Scribe. 09/28/2015. 11:17 PM.  Chief Complaint  Patient presents with  . hemoglobin down, 8.7    The history is provided by the patient and a relative. No language interpreter was used.   HPI Comments:  [name] is a 82 y.o. female with a history of upper GI bleed secondary to duodenal ulcer, who presents to the Emergency Department complaining of  increased fatigue x a few days. Pt saw her PCP yesterday for a follow up after hospital admission ~ 2 weeks ago for treatment of a peptic ulcer. During this visit she had blood work drawn. She was called with results today and was told her Hgb was 8.7 and was advised to come to the ED. Pt denies SOB, melena , bloody stools, blood in her vomit, abdominal pain, nausea, appetite change, fever at home, cough, dysuria and hematuria. No alleviating factors noted. She denies use of anticoagulants. Daughter states pt was started on iron today by PCP.    Past Medical History  Diagnosis Date  . Hypercholesterolemia   . Hypertension   . Mitral valve prolapse   . Mild acid reflux   . Staph infection      history of a wound infection following a previous right hip surgery  . CKD (chronic kidney disease) stage 3, GFR 30-59 ml/min   . Spinal stenosis   . Renal insufficiency   . Upper GI bleed     duodenal ulcer (09/2015) s/p endo clipping   Past Surgical History  Procedure Laterality Date  . Right breast lumpectomy    . Left total hip replacement arthroplasty  June of 2001  . Right total hip replacement arthroplasty  August of 2001  . Abcess drainage    . Colonoscopy  04/30/01    [id number] hemorrhoids, otherwise, normal rectum/colon/Normal terminal ileum  . Esophagogastroduodenoscopy     03/01/2007    [id number] esophagus without evidence of Barrett's, mass/Mild erythema in the antrum.  Distorted pylorus consistent with prior ulcer disease.  Biopsies obtained/ 3. Junction of D1 and D2 erythematous, edematous, and strictured.  The lumen was narrowed to approximately 10 mm.   . Colonoscopy  03/02/2007    [id number] sigmoid colon diverticula.  Otherwise no polyps, masses/Normal terminal ileum.  Approximately 10-15 cm of the distal terminal ileum visualized/normal view of the rectum  . Total knee arthroplasty    . Abdominal hysterectomy    . Esophagogastroduodenoscopy N/A 09/09/2015    Procedure: ESOPHAGOGASTRODUODENOSCOPY (EGD);  Surgeon: Jerene Bears, MD;  Location: [facility] ENDOSCOPY;  Service: Endoscopy;  Laterality: N/A;   Family History  Problem Relation Age of Onset  . Colon cancer Neg Hx    Social History  Substance Use Topics  . Smoking status: Never Smoker   . Smokeless tobacco: Never Used  . Alcohol Use: No   OB History    No data available     Review of Systems  Constitutional: Positive for fatigue. Negative for fever and appetite change.  Respiratory: Negative for cough and shortness of breath.   Gastrointestinal: Negative for nausea, vomiting, abdominal pain and blood in stool.  Genitourinary: Negative for dysuria and hematuria.  All other systems reviewed and are negative.  Allergies  Demerol and Meperidine hcl  Home  Medications   Prior to Admission medications   Medication Sig Start Date End Date Taking? Authorizing Provider  albuterol (PROVENTIL HFA;VENTOLIN HFA) 108 (90 BASE) MCG/ACT inhaler Inhale 2 puffs into the lungs every 4 (four) hours as needed for wheezing or shortness of breath. 05/25/14  Yes Susy Frizzle, MD  albuterol (PROVENTIL) (2.5 MG/3ML) 0.083% nebulizer solution Take 3 mLs (2.5 mg total) by nebulization every 6 (six) hours as needed for wheezing or shortness of breath. 06/14/14  Yes Susy Frizzle, MD  amLODipine (NORVASC) 10 MG tablet  Take 1 tablet (10 mg total) by mouth daily. 05/04/15  Yes Susy Frizzle, MD  atorvastatin (LIPITOR) 20 MG tablet Take 1 tablet (20 mg total) by mouth daily. 09/27/15  Yes Susy Frizzle, MD  Biotin 2500 MCG CAPS Take 2,500 mg by mouth daily.   Yes Historical Provider, MD  cetirizine (ZYRTEC) 10 MG tablet Take 10 mg by mouth daily.   Yes Historical Provider, MD  citalopram (CELEXA) 20 MG tablet TAKE 1 TABLET DAILY 11/04/14  Yes Susy Frizzle, MD  HYDROcodone-acetaminophen (NORCO/VICODIN) 5-325 MG tablet Take 0.5-1 tablets by mouth every 6 (six) hours as needed for moderate pain. 09/27/15  Yes Susy Frizzle, MD  losartan-hydrochlorothiazide (HYZAAR) 50-12.5 MG tablet Take 1 tablet by mouth daily. 01/10/15  Yes Susy Frizzle, MD  metoprolol tartrate (LOPRESSOR) 25 MG tablet Take 1 tablet (25 mg total) by mouth 2 (two) times daily. 09/16/15  Yes Domenic Polite, MD  montelukast (SINGULAIR) 10 MG tablet Take 1 tablet (10 mg total) by mouth at bedtime. 11/01/14  Yes Susy Frizzle, MD  Multiple Vitamin (MULTIVITAMIN WITH MINERALS) TABS tablet Take 1 tablet by mouth daily.   Yes Historical Provider, MD  pantoprazole (PROTONIX) 40 MG tablet Take 1 tablet (40 mg total) by mouth 2 (two) times daily. For 4weeks then daily 09/16/15  Yes Domenic Polite, MD  vitamin B-12 (CYANOCOBALAMIN) 500 MCG tablet Take 500 mcg by mouth daily.   Yes Historical Provider, MD  butalbital-aspirin-caffeine [facility]) 50-325-40 MG per capsule TAKE ONE CAPSULE BY MOUTH EVERY 8 HOURS AS NEEDED FOR HEADACHE Patient not taking: Reported on 09/28/2015 04/15/14   Susy Frizzle, MD  clotrimazole-betamethasone (LOTRISONE) cream Apply 1 application topically 2 (two) times daily. Patient not taking: Reported on 09/28/2015 07/02/13   Susy Frizzle, MD  gabapentin (NEURONTIN) 300 MG capsule Take 1 capsule (300 mg total) by mouth at bedtime. Patient not taking: Reported on 09/28/2015 09/16/15   Domenic Polite, MD   BP 130/66 mmHg   Pulse 88  Temp(Src) 99 F (37.2 C) (Oral)  Resp 19  SpO2 88% Physical Exam  Constitutional: She is oriented to person, place, and time. She appears well-developed and well-nourished.  HENT:  Head: Normocephalic and atraumatic.  Right Ear: External ear normal.  Left Ear: External ear normal.  Nose: Nose normal.  Eyes: Right eye exhibits no discharge. Left eye exhibits no discharge.  Cardiovascular: Normal rate, regular rhythm and normal heart sounds.   Pulmonary/Chest: Effort normal and breath sounds normal.  Abdominal: Soft. There is no tenderness.  Genitourinary:  Light redness over perineum consistent with stage 1 decubitus ulcer  No obvious external hemorrhoid Brown stool on rectal exam Chaperone (RN) was present for exam which was performed with no discomfort or complications.   Neurological: She is alert and oriented to person, place, and time.  Skin: Skin is warm and dry.  Nursing note and vitals reviewed.   ED Course  Procedures  DIAGNOSTIC STUDIES:  Oxygen Saturation is 95% on RA, normal by my interpretation.    COORDINATION OF CARE:  11:11 PM Pt updated with results. Will consult GI. Discussed treatment plan with pt at bedside and pt agreed to plan.  Labs Review Labs Reviewed  COMPREHENSIVE METABOLIC PANEL - Abnormal; Notable for the following:    Sodium 133 (*)    Chloride 99 (*)    Glucose, Bld 111 (*)    Creatinine, Ser 1.17 (*)    Total Protein 6.4 (*)    Albumin 2.8 (*)    GFR calc non Af Amer 43 (*)    GFR calc Af Amer 49 (*)    All other components within normal limits  CBC - Abnormal; Notable for the following:    RBC 3.23 (*)    Hemoglobin 8.6 (*)    HCT 28.1 (*)    RDW 15.6 (*)    Platelets 438 (*)    All other components within normal limits  POC OCCULT BLOOD, ED  TYPE AND SCREEN    Imaging Review No results found. I have personally reviewed and evaluated these images and lab results as part of my medical decision-making.   EKG  Interpretation None       11:48 PM Discussed case with Dr. Paulene Floor (GI)  11:53 PM Pt updated with plan.    MDM   Final diagnoses:  Anemia, unspecified anemia type    Patient states she feels a little bit fatigued but is not tachycardic or hypotensive. Low-grade temperature in triage although when rechecked it was 99 and then a rectal temp was normal. Hemoccult exam shows no microscopic blood and no gross blood on exam. Her hemoglobin is stable from 27 hours before. Unclear exactly when her blood loss occurred given that her last hemoglobin was 10 days prior. I doubt that she is currently actively bleeding. I discussed her case with her GI specialist, Dr. Hilarie Fredrickson, who agrees that she is stable for discharge home and recommends following up with PCP in 2 days for a repeat hemoglobin. I discussed strict return precautions with family and patient. She is staying with family and can be observed by them.  I personally performed the services described in this documentation, which was scribed in my presence. The recorded information has been reviewed and is accurate.     Sherwood Gambler, MD 09/29/15 [phone]

## 2015-09-29 NOTE — Telephone Encounter (Signed)
Yes, I was contacted by ED physician overnight. Hemoglobin stable over the last 24+ hours No melena, rectal bleeding or hematochezia I recommend she continue ulcer therapy with twice a day PPI. No NSAIDs. H. pylori antibody negative. Monitor hemoglobin closely through primary care office and supplement iron as needed.

## 2015-09-29 NOTE — Telephone Encounter (Signed)
NEED TO RECHECK CBC HERE TOMORROW

## 2015-09-29 NOTE — Telephone Encounter (Signed)
She was seen in the ED, Hgb was stable from 2 days prior, no overt bleeding. So she went home, and it appears that they contacted you.

## 2015-09-29 NOTE — Telephone Encounter (Signed)
To PCP

## 2015-09-30 ENCOUNTER — Encounter: Payer: Self-pay | Admitting: Family Medicine

## 2015-09-30 ENCOUNTER — Ambulatory Visit (INDEPENDENT_AMBULATORY_CARE_PROVIDER_SITE_OTHER): Payer: Medicare Other | Admitting: Family Medicine

## 2015-09-30 VITALS — BP 138/76 | HR 80 | Temp 98.1°F | Resp 20 | Wt 152.0 lb

## 2015-09-30 DIAGNOSIS — N183 Chronic kidney disease, stage 3 (moderate): Secondary | ICD-10-CM | POA: Diagnosis not present

## 2015-09-30 DIAGNOSIS — A419 Sepsis, unspecified organism: Secondary | ICD-10-CM | POA: Diagnosis not present

## 2015-09-30 DIAGNOSIS — I129 Hypertensive chronic kidney disease with stage 1 through stage 4 chronic kidney disease, or unspecified chronic kidney disease: Secondary | ICD-10-CM | POA: Diagnosis not present

## 2015-09-30 DIAGNOSIS — E785 Hyperlipidemia, unspecified: Secondary | ICD-10-CM | POA: Diagnosis not present

## 2015-09-30 DIAGNOSIS — D649 Anemia, unspecified: Secondary | ICD-10-CM

## 2015-09-30 DIAGNOSIS — K922 Gastrointestinal hemorrhage, unspecified: Secondary | ICD-10-CM | POA: Diagnosis not present

## 2015-09-30 DIAGNOSIS — J441 Chronic obstructive pulmonary disease with (acute) exacerbation: Secondary | ICD-10-CM | POA: Diagnosis not present

## 2015-09-30 DIAGNOSIS — F329 Major depressive disorder, single episode, unspecified: Secondary | ICD-10-CM | POA: Diagnosis not present

## 2015-09-30 DIAGNOSIS — Z9181 History of falling: Secondary | ICD-10-CM | POA: Diagnosis not present

## 2015-09-30 DIAGNOSIS — K269 Duodenal ulcer, unspecified as acute or chronic, without hemorrhage or perforation: Secondary | ICD-10-CM | POA: Diagnosis not present

## 2015-09-30 DIAGNOSIS — M4800 Spinal stenosis, site unspecified: Secondary | ICD-10-CM | POA: Diagnosis not present

## 2015-09-30 DIAGNOSIS — Z7951 Long term (current) use of inhaled steroids: Secondary | ICD-10-CM | POA: Diagnosis not present

## 2015-09-30 DIAGNOSIS — D62 Acute posthemorrhagic anemia: Secondary | ICD-10-CM | POA: Diagnosis not present

## 2015-09-30 DIAGNOSIS — D72829 Elevated white blood cell count, unspecified: Secondary | ICD-10-CM | POA: Diagnosis not present

## 2015-09-30 DIAGNOSIS — J690 Pneumonitis due to inhalation of food and vomit: Secondary | ICD-10-CM | POA: Diagnosis not present

## 2015-09-30 LAB — CBC
HCT: 26.4 % — ABNORMAL LOW (ref 35.0–45.0)
Hemoglobin: 8.7 g/dL — CL (ref 12.0–15.0)
MCH: 28.3 pg (ref 27.0–33.0)
MCHC: 33 g/dL (ref 32.0–36.0)
MCV: 86 fL (ref 80.0–100.0)
PLATELETS: 482 10*3/uL — AB (ref 140–400)
RBC: 3.07 MIL/uL — ABNORMAL LOW (ref 3.80–5.10)
RDW: 15.8 % — AB (ref 11.0–15.0)
WBC: 8.8 10*3/uL (ref 3.8–10.8)

## 2015-09-30 NOTE — Telephone Encounter (Signed)
Pt has appt here today will check at that time

## 2015-09-30 NOTE — Progress Notes (Signed)
Subjective:    Patient ID: [name], female    DOB: 05-09-1932, 82 y.o.   MRN: [id number]  HPI   Recently admitted to the hospital for shortness of breath secondary to asthma exacerbation complicated by aspiration pneumonia. Also developed an upper GI bleed secondary to duodenal ulcers while in the hospital. I have copied relevant portions of the discharge summary and included them below for my reference:  Admit date: 09/03/2015 Discharge date: 09/16/2015  Time spent:45 minutes  Recommendations for Outpatient Follow-up:  1. PCP in 1 week   Discharge Diagnoses:   Asthma exacerbation  Upper Gi bleed  Duodenal ulcer  Sepsis  Aspiration pneumonia  CKD (chronic kidney disease) stage 3, GFR 30-59 ml/min  Hypoxemia  Hypokalemia  Asthma exacerbation  Melena  Acute blood loss anemia  Duodenal ulcer hemorrhage  Leukocytosis   Discharge Condition: stable  Diet recommendation: low sodium  Filed Weights   09/09/15 1619 09/10/15 0427 09/14/15 1606  Weight: 73.2 kg (161 lb 6 oz) 74 kg (163 lb 2.3 oz) 69.4 kg (153 lb)    History of present illness:  82 y.o. female with past medical history significant for hypertension, spinal stenosis, dyslipidemia, asthma. Patient presented to [facility] with worsening shortness of breath over past 3 days prior to this admission. She used at home inhalers with no significant symptomatic relief. She initially presented to urgent care and found to be hypoxic around 80s and she was referred to emergency department for further evaluation  Hospital Course:   Acute asthma exacerbation -improved, resolved  Acute blood loss anemia / Acute upper GI bleed secondary to duodenal bulb hemorrhage / Multiple duodenal ulcers one with visible vessel - developed blood and tarry stools after admission overnight on 6/21 -Gi was consulted, she had an EGD on 6/23 - EGD 09/09/2015 - LA Grade D reflux esophagitis. Multiple  non-obstructing duodenal ulcers, 1 with a visible vessel. H/Pylori negative. - s/p injection and Endo Clipping of ulcer with visible vessel, was on IV PPI, now changed to PO - s/p 2 units PRBC transfusion, no further bleeding now - CT abdomen pelvis without findings of intraabdominal bleed - GI signed off on 09/12/2015  Sepsis, unspecified organism / Aspiration pneumonia - Hospitalization was also complicated by Sepsis/aspiration pnuemonia - leukocytosis initially thought to be secondary to steroids.  - Then sepsis criteria met on 6/22. WBC count up to 43.6. RR was 14-20 and HR 74-105 - CXR with RLL pneumonia and she was started on zosyn 6/23. She was also given PO vanco 6/23 for possible C.diff colitis but stopped 09/10/2015 because C. difficile PCR negative - Blood cultures so far show no growth - Leukocytosis is improving,afebrile now - Day 6 of Zosyn, and 2days of augmentin, improving, Abx stopped - Per SLP study - thin liquids, meds with pure.  -leukocytosis continues to improve from 43K at peak to 19K now  Ileus -resolved -tolerating Po and Having loose stools  Diarrhea -due to Abx, resolving ileus -Cdiff PCR negative -improving  Acute respiratory failure with hypoxia /Acute Asthma exacerbation  -improved, had Asthma exacerbation and Asp PNA  Acute metabolic encephalopathy - due to Sepsis/Aspiration pneumonia - MRI with no acute intracranial findings - improved back to baseline  Accelerated hypertension / Essential hypertension - Continue Norvasc and metoprolol   Chronic kidney disease stage III - Baseline creatinine 1.4 about 10 months ago - Creatinine on this admission 1.09 and up to 1.4, still within baseline range   Hypokalemia - Likely  secondary to nebulizer treatments - Potassium subsequently within normal limits  Depression - held celexa 6/23 due to lethargy - resumed now  Dyslipidemia - Continue Lipitor 20 mg daily   Steroid induced  hyperglycemia - A1c is 5.6 - Continue sliding scale insulin - CBGs in past 24 hours:114, 125, 124  Recent falls - PT evaluation - HHPT recommended   Consultants:   Physical therapy   Pulmonary - respiratory team   Gastroenterology  Procedures:   EGD by Dr. Hilarie Fredrickson 09/09/2015 - LA Grade D reflux esophagitis. No gross lesions in the stomach. Blood in the duodenal bulb and in the second portion of the duodenum. Multiple non-obstructing duodenal ulcers, 1 with a visible vessel. Injected.   Protonix infusion 6/23 -->  1 U PRBC 09/10/2015   1 U PRBC 09/12/2015    09/27/15 Since discharge from the hospital, her daughter is very concerned about memory loss. Patient is becoming very forgetful. She is forgetting the names of family members including spouse is a sons and daughters. She is also becoming confused particularly at night. She was sundowning in the hospital. She is not taking any iron. She is taking a proton pump inhibitor twice a day. Her breathing has improved. She denies any fevers or chills or coughing. She denies any black tarry stools or blood in her stool. She denies any fevers or chills. However she has lost significant weight since last time I saw her. She is still not eating well. She has poor appetite.  At that time, my plan was: Recheck a CBC to monitor her anemia. Recommended iron sulfate 325 mg by mouth twice a day. Recommended in short 2 cans per day to increase her protein consumption and correct her protein calorie malnutrition. Her blood pressures elevated today but I hesitate to make any changes given how weak she appears. Recheck blood pressure in 2 weeks. In 2 weeks I would also perform a Mini-Mental status exam. Hopefully that time the patient is closer to her baseline and we will get a more accurate assessment of her underlying mental status. She very well could be developing some mild dementia. Recommended avoidance of all NSAIDs area she can use Tylenol and/or  hydrocodone for pain.  09/30/15 Hemoglobin returned low at 8.7. My partner discussed the case with Dr. Hilarie Fredrickson with GI recommended ER evaluation and possible admission for repeat EGD as he was concerned that she may be rebleeding from her duodenal ulcers. At the emergency room, he hemoglobin was found to be 8.6.It was felt that she was stable to go home and follow-up with me after the emergency room physician discussed the case with the GI physician given the fact that hemoglobin had been stable for 24 hours. She is here today to recheck a CBC. Overall she is doing well. She denies any chest pain shortness of breath and dyspnea on exertion. She does complain of fatigue. She is not tachycardic. Denies any black tarry stools. Stat CBC today at the office shows a hemoglobin of 8.7 which is stable Past Medical History  Diagnosis Date  . Hypercholesterolemia   . Hypertension   . Mitral valve prolapse   . Mild acid reflux   . Staph infection      history of a wound infection following a previous right hip surgery  . CKD (chronic kidney disease) stage 3, GFR 30-59 ml/min   . Spinal stenosis   . Renal insufficiency   . Upper GI bleed     duodenal ulcer (09/2015) s/p  endo clipping   Past Surgical History  Procedure Laterality Date  . Right breast lumpectomy    . Left total hip replacement arthroplasty  June of 2001  . Right total hip replacement arthroplasty  August of 2001  . Abcess drainage    . Colonoscopy  04/30/01    YQI:[id number] hemorrhoids, otherwise, normal rectum/colon/Normal terminal ileum  . Esophagogastroduodenoscopy    03/01/2007    GLO:VFIEPP esophagus without evidence of Barrett's, mass/Mild erythema in the antrum.  Distorted pylorus consistent with prior ulcer disease.  Biopsies obtained/ 3. Junction of D1 and D2 erythematous, edematous, and strictured.  The lumen was narrowed to approximately 10 mm.   . Colonoscopy  03/02/2007    IRJ:JOAC sigmoid colon diverticula.  Otherwise no  polyps, masses/Normal terminal ileum.  Approximately 10-15 cm of the distal terminal ileum visualized/normal view of the rectum  . Total knee arthroplasty    . Abdominal hysterectomy    . Esophagogastroduodenoscopy N/A 09/09/2015    Procedure: ESOPHAGOGASTRODUODENOSCOPY (EGD);  Surgeon: Jerene Bears, MD;  Location: [facility] ENDOSCOPY;  Service: Endoscopy;  Laterality: N/A;   Current Outpatient Prescriptions on File Prior to Visit  Medication Sig Dispense Refill  . albuterol (PROVENTIL HFA;VENTOLIN HFA) 108 (90 BASE) MCG/ACT inhaler Inhale 2 puffs into the lungs every 4 (four) hours as needed for wheezing or shortness of breath. 1 Inhaler 0  . albuterol (PROVENTIL) (2.5 MG/3ML) 0.083% nebulizer solution Take 3 mLs (2.5 mg total) by nebulization every 6 (six) hours as needed for wheezing or shortness of breath. 150 mL 0  . amLODipine (NORVASC) 10 MG tablet Take 1 tablet (10 mg total) by mouth daily. 90 tablet 1  . atorvastatin (LIPITOR) 20 MG tablet Take 1 tablet (20 mg total) by mouth daily. 90 tablet 3  . Biotin 2500 MCG CAPS Take 2,500 mg by mouth daily.    . butalbital-aspirin-caffeine (FIORINAL) 50-325-40 MG per capsule TAKE ONE CAPSULE BY MOUTH EVERY 8 HOURS AS NEEDED FOR HEADACHE (Patient not taking: Reported on 09/28/2015) 30 capsule 0  . cetirizine (ZYRTEC) 10 MG tablet Take 10 mg by mouth daily.    . citalopram (CELEXA) 20 MG tablet TAKE 1 TABLET DAILY 90 tablet 3  . clotrimazole-betamethasone (LOTRISONE) cream Apply 1 application topically 2 (two) times daily. (Patient not taking: Reported on 09/28/2015) 30 g 0  . gabapentin (NEURONTIN) 300 MG capsule Take 1 capsule (300 mg total) by mouth at bedtime. (Patient not taking: Reported on 09/28/2015)    . HYDROcodone-acetaminophen (NORCO/VICODIN) 5-325 MG tablet Take 0.5-1 tablets by mouth every 6 (six) hours as needed for moderate pain. 60 tablet 0  . losartan-hydrochlorothiazide (HYZAAR) 50-12.5 MG tablet Take 1 tablet by mouth daily. 30 tablet 11  .  metoprolol tartrate (LOPRESSOR) 25 MG tablet Take 1 tablet (25 mg total) by mouth 2 (two) times daily. 60 tablet 0  . montelukast (SINGULAIR) 10 MG tablet Take 1 tablet (10 mg total) by mouth at bedtime. 30 tablet 3  . Multiple Vitamin (MULTIVITAMIN WITH MINERALS) TABS tablet Take 1 tablet by mouth daily.    . pantoprazole (PROTONIX) 40 MG tablet Take 1 tablet (40 mg total) by mouth 2 (two) times daily. For 4weeks then daily 60 tablet 1  . vitamin B-12 (CYANOCOBALAMIN) 500 MCG tablet Take 500 mcg by mouth daily.     No current facility-administered medications on file prior to visit.   Allergies  Allergen Reactions  . Demerol [Meperidine] Other (See Comments)    Altered mental status  . Meperidine Hcl  Other (See Comments)    Altered mental status   Social History   Social History  . Marital Status: Widowed    Spouse Name: N/A  . Number of Children: N/A  . Years of Education: N/A   Occupational History  . Not on file.   Social History Main Topics  . Smoking status: Never Smoker   . Smokeless tobacco: Never Used  . Alcohol Use: No  . Drug Use: No  . Sexual Activity: No     Comment: widowed   Other Topics Concern  . Not on file   Social History Narrative     Review of Systems  All other systems reviewed and are negative.      Objective:   Physical Exam  Constitutional: She appears well-developed and well-nourished.  Neck: No JVD present.  Cardiovascular: Normal rate, regular rhythm and normal heart sounds.   No murmur heard. Pulmonary/Chest: Effort normal and breath sounds normal. No respiratory distress. She has no wheezes. She has no rales.  Abdominal: Soft. Bowel sounds are normal. She exhibits no distension. There is no tenderness. There is no rebound and no guarding.  Musculoskeletal: She exhibits no edema.  Lymphadenopathy:    She has no cervical adenopathy.  Vitals reviewed.         Assessment & Plan:  Low hemoglobin - Plan: CBC  Increase iron  sulfate 325 mg by mouth twice a day. Monitor for signs and symptoms of worsening anemia. I spent 15 minutes with the patient and her family discussing the signs and symptoms. Recheck here on Monday.

## 2015-10-03 ENCOUNTER — Encounter: Payer: Self-pay | Admitting: Family Medicine

## 2015-10-03 ENCOUNTER — Ambulatory Visit (INDEPENDENT_AMBULATORY_CARE_PROVIDER_SITE_OTHER): Payer: Medicare Other | Admitting: Family Medicine

## 2015-10-03 VITALS — BP 144/80 | HR 86 | Temp 98.1°F | Resp 20 | Wt 153.0 lb

## 2015-10-03 DIAGNOSIS — D649 Anemia, unspecified: Secondary | ICD-10-CM | POA: Diagnosis not present

## 2015-10-03 DIAGNOSIS — K264 Chronic or unspecified duodenal ulcer with hemorrhage: Secondary | ICD-10-CM

## 2015-10-03 LAB — CBC
HCT: 29.2 % — ABNORMAL LOW (ref 35.0–45.0)
Hemoglobin: 9.5 g/dL — ABNORMAL LOW (ref 12.0–15.0)
MCH: 27.6 pg (ref 27.0–33.0)
MCHC: 32.5 g/dL (ref 32.0–36.0)
MCV: 84.9 fL (ref 80.0–100.0)
PLATELETS: 611 10*3/uL — AB (ref 140–400)
RBC: 3.44 MIL/uL — ABNORMAL LOW (ref 3.80–5.10)
RDW: 15.9 % — AB (ref 11.0–15.0)
WBC: 10.4 10*3/uL (ref 3.8–10.8)

## 2015-10-03 NOTE — Addendum Note (Signed)
Addended by: Shary Decamp B on: 10/03/2015 03:23 PM   Modules accepted: Orders

## 2015-10-03 NOTE — Progress Notes (Signed)
Subjective:    Patient ID: [name], female    DOB: 05-09-1932, 82 y.o.   MRN: [id number]  HPI   Recently admitted to the hospital for shortness of breath secondary to asthma exacerbation complicated by aspiration pneumonia. Also developed an upper GI bleed secondary to duodenal ulcers while in the hospital. I have copied relevant portions of the discharge summary and included them below for my reference:  Admit date: 09/03/2015 Discharge date: 09/16/2015  Time spent:45 minutes  Recommendations for Outpatient Follow-up:  1. PCP in 1 week   Discharge Diagnoses:   Asthma exacerbation  Upper Gi bleed  Duodenal ulcer  Sepsis  Aspiration pneumonia  CKD (chronic kidney disease) stage 3, GFR 30-59 ml/min  Hypoxemia  Hypokalemia  Asthma exacerbation  Melena  Acute blood loss anemia  Duodenal ulcer hemorrhage  Leukocytosis   Discharge Condition: stable  Diet recommendation: low sodium  Filed Weights   09/09/15 1619 09/10/15 0427 09/14/15 1606  Weight: 73.2 kg (161 lb 6 oz) 74 kg (163 lb 2.3 oz) 69.4 kg (153 lb)    History of present illness:  82 y.o. female with past medical history significant for hypertension, spinal stenosis, dyslipidemia, asthma. Patient presented to [facility] with worsening shortness of breath over past 3 days prior to this admission. She used at home inhalers with no significant symptomatic relief. She initially presented to urgent care and found to be hypoxic around 80s and she was referred to emergency department for further evaluation  Hospital Course:   Acute asthma exacerbation -improved, resolved  Acute blood loss anemia / Acute upper GI bleed secondary to duodenal bulb hemorrhage / Multiple duodenal ulcers one with visible vessel - developed blood and tarry stools after admission overnight on 6/21 -Gi was consulted, she had an EGD on 6/23 - EGD 09/09/2015 - LA Grade D reflux esophagitis. Multiple  non-obstructing duodenal ulcers, 1 with a visible vessel. H/Pylori negative. - s/p injection and Endo Clipping of ulcer with visible vessel, was on IV PPI, now changed to PO - s/p 2 units PRBC transfusion, no further bleeding now - CT abdomen pelvis without findings of intraabdominal bleed - GI signed off on 09/12/2015  Sepsis, unspecified organism / Aspiration pneumonia - Hospitalization was also complicated by Sepsis/aspiration pnuemonia - leukocytosis initially thought to be secondary to steroids.  - Then sepsis criteria met on 6/22. WBC count up to 43.6. RR was 14-20 and HR 74-105 - CXR with RLL pneumonia and she was started on zosyn 6/23. She was also given PO vanco 6/23 for possible C.diff colitis but stopped 09/10/2015 because C. difficile PCR negative - Blood cultures so far show no growth - Leukocytosis is improving,afebrile now - Day 6 of Zosyn, and 2days of augmentin, improving, Abx stopped - Per SLP study - thin liquids, meds with pure.  -leukocytosis continues to improve from 43K at peak to 19K now  Ileus -resolved -tolerating Po and Having loose stools  Diarrhea -due to Abx, resolving ileus -Cdiff PCR negative -improving  Acute respiratory failure with hypoxia /Acute Asthma exacerbation  -improved, had Asthma exacerbation and Asp PNA  Acute metabolic encephalopathy - due to Sepsis/Aspiration pneumonia - MRI with no acute intracranial findings - improved back to baseline  Accelerated hypertension / Essential hypertension - Continue Norvasc and metoprolol   Chronic kidney disease stage III - Baseline creatinine 1.4 about 10 months ago - Creatinine on this admission 1.09 and up to 1.4, still within baseline range   Hypokalemia - Likely  secondary to nebulizer treatments - Potassium subsequently within normal limits  Depression - held celexa 6/23 due to lethargy - resumed now  Dyslipidemia - Continue Lipitor 20 mg daily   Steroid induced  hyperglycemia - A1c is 5.6 - Continue sliding scale insulin - CBGs in past 24 hours:114, 125, 124  Recent falls - PT evaluation - HHPT recommended   Consultants:   Physical therapy   Pulmonary - respiratory team   Gastroenterology  Procedures:   EGD by Dr. Hilarie Fredrickson 09/09/2015 - LA Grade D reflux esophagitis. No gross lesions in the stomach. Blood in the duodenal bulb and in the second portion of the duodenum. Multiple non-obstructing duodenal ulcers, 1 with a visible vessel. Injected.   Protonix infusion 6/23 -->  1 U PRBC 09/10/2015   1 U PRBC 09/12/2015    09/27/15 Since discharge from the hospital, her daughter is very concerned about memory loss. Patient is becoming very forgetful. She is forgetting the names of family members including spouse is a sons and daughters. She is also becoming confused particularly at night. She was sundowning in the hospital. She is not taking any iron. She is taking a proton pump inhibitor twice a day. Her breathing has improved. She denies any fevers or chills or coughing. She denies any black tarry stools or blood in her stool. She denies any fevers or chills. However she has lost significant weight since last time I saw her. She is still not eating well. She has poor appetite.  At that time, my plan was: Recheck a CBC to monitor her anemia. Recommended iron sulfate 325 mg by mouth twice a day. Recommended in short 2 cans per day to increase her protein consumption and correct her protein calorie malnutrition. Her blood pressures elevated today but I hesitate to make any changes given how weak she appears. Recheck blood pressure in 2 weeks. In 2 weeks I would also perform a Mini-Mental status exam. Hopefully that time the patient is closer to her baseline and we will get a more accurate assessment of her underlying mental status. She very well could be developing some mild dementia. Recommended avoidance of all NSAIDs area she can use Tylenol and/or  hydrocodone for pain.  09/30/15 Hemoglobin returned low at 8.7. My partner discussed the case with Dr. Hilarie Fredrickson with GI recommended ER evaluation and possible admission for repeat EGD as he was concerned that she may be rebleeding from her duodenal ulcers. At the emergency room, he hemoglobin was found to be 8.6.It was felt that she was stable to go home and follow-up with me after the emergency room physician discussed the case with the GI physician given the fact that hemoglobin had been stable for 24 hours. She is here today to recheck a CBC. Overall she is doing well. She denies any chest pain shortness of breath and dyspnea on exertion. She does complain of fatigue. She is not tachycardic. Denies any black tarry stools. Stat CBC today at the office shows a hemoglobin of 8.7 which is stable  10/03/15 Hemoglobin is stable at last office visit and 8.7. She has increased her iron to 325 mg by mouth twice a day. She denies any bleeding or bruising although she does have black stools due to iron. Her weight is up 1 pound in her blood pressure stable. I did perform Mini-Mental Status exam today. She is able to get 4 out of 5 questions correct on the date. She was able to remember 2 out of 3 objects  on recall. She was unable to perform serial sevens or spell world backward. On mini cog assessment/clock drawing. The patient was able to draw a clock but only able to get 6 numbers. All of them were lumped into the upper right quadrant of the clock. She was unable to draw the hands on the clock to state the appropriate time. All this is consistent with moderate dementia. Past Medical History  Diagnosis Date  . Hypercholesterolemia   . Hypertension   . Mitral valve prolapse   . Mild acid reflux   . Staph infection      history of a wound infection following a previous right hip surgery  . CKD (chronic kidney disease) stage 3, GFR 30-59 ml/min   . Spinal stenosis   . Renal insufficiency   . Upper GI bleed      duodenal ulcer (09/2015) s/p endo clipping   Past Surgical History  Procedure Laterality Date  . Right breast lumpectomy    . Left total hip replacement arthroplasty  June of 2001  . Right total hip replacement arthroplasty  August of 2001  . Abcess drainage    . Colonoscopy  04/30/01    [id number] hemorrhoids, otherwise, normal rectum/colon/Normal terminal ileum  . Esophagogastroduodenoscopy    03/01/2007    LPF:[id number] esophagus without evidence of Barrett's, mass/Mild erythema in the antrum.  Distorted pylorus consistent with prior ulcer disease.  Biopsies obtained/ 3. Junction of D1 and D2 erythematous, edematous, and strictured.  The lumen was narrowed to approximately 10 mm.   . Colonoscopy  03/02/2007    XBD:ZHGD sigmoid colon diverticula.  Otherwise no polyps, masses/Normal terminal ileum.  Approximately 10-15 cm of the distal terminal ileum visualized/normal view of the rectum  . Total knee arthroplasty    . Abdominal hysterectomy    . Esophagogastroduodenoscopy N/A 09/09/2015    Procedure: ESOPHAGOGASTRODUODENOSCOPY (EGD);  Surgeon: Jerene Bears, MD;  Location: [facility] ENDOSCOPY;  Service: Endoscopy;  Laterality: N/A;   Current Outpatient Prescriptions on File Prior to Visit  Medication Sig Dispense Refill  . albuterol (PROVENTIL HFA;VENTOLIN HFA) 108 (90 BASE) MCG/ACT inhaler Inhale 2 puffs into the lungs every 4 (four) hours as needed for wheezing or shortness of breath. 1 Inhaler 0  . albuterol (PROVENTIL) (2.5 MG/3ML) 0.083% nebulizer solution Take 3 mLs (2.5 mg total) by nebulization every 6 (six) hours as needed for wheezing or shortness of breath. 150 mL 0  . amLODipine (NORVASC) 10 MG tablet Take 1 tablet (10 mg total) by mouth daily. 90 tablet 1  . atorvastatin (LIPITOR) 20 MG tablet Take 1 tablet (20 mg total) by mouth daily. 90 tablet 3  . Biotin 2500 MCG CAPS Take 2,500 mg by mouth daily.    . butalbital-aspirin-caffeine (FIORINAL) 50-325-40 MG per capsule TAKE ONE CAPSULE  BY MOUTH EVERY 8 HOURS AS NEEDED FOR HEADACHE (Patient not taking: Reported on 09/28/2015) 30 capsule 0  . cetirizine (ZYRTEC) 10 MG tablet Take 10 mg by mouth daily.    . citalopram (CELEXA) 20 MG tablet TAKE 1 TABLET DAILY 90 tablet 3  . clotrimazole-betamethasone (LOTRISONE) cream Apply 1 application topically 2 (two) times daily. (Patient not taking: Reported on 09/28/2015) 30 g 0  . gabapentin (NEURONTIN) 300 MG capsule Take 1 capsule (300 mg total) by mouth at bedtime. (Patient not taking: Reported on 09/28/2015)    . HYDROcodone-acetaminophen (NORCO/VICODIN) 5-325 MG tablet Take 0.5-1 tablets by mouth every 6 (six) hours as needed for moderate pain. 60 tablet 0  . losartan-hydrochlorothiazide (  HYZAAR) 50-12.5 MG tablet Take 1 tablet by mouth daily. 30 tablet 11  . metoprolol tartrate (LOPRESSOR) 25 MG tablet Take 1 tablet (25 mg total) by mouth 2 (two) times daily. 60 tablet 0  . montelukast (SINGULAIR) 10 MG tablet Take 1 tablet (10 mg total) by mouth at bedtime. 30 tablet 3  . Multiple Vitamin (MULTIVITAMIN WITH MINERALS) TABS tablet Take 1 tablet by mouth daily.    . pantoprazole (PROTONIX) 40 MG tablet Take 1 tablet (40 mg total) by mouth 2 (two) times daily. For 4weeks then daily 60 tablet 1  . vitamin B-12 (CYANOCOBALAMIN) 500 MCG tablet Take 500 mcg by mouth daily.     No current facility-administered medications on file prior to visit.   Allergies  Allergen Reactions  . Demerol [Meperidine] Other (See Comments)    Altered mental status  . Meperidine Hcl Other (See Comments)    Altered mental status   Social History   Social History  . Marital Status: Widowed    Spouse Name: N/A  . Number of Children: N/A  . Years of Education: N/A   Occupational History  . Not on file.   Social History Main Topics  . Smoking status: Never Smoker   . Smokeless tobacco: Never Used  . Alcohol Use: No  . Drug Use: No  . Sexual Activity: No     Comment: widowed   Other Topics Concern   . Not on file   Social History Narrative     Review of Systems  All other systems reviewed and are negative.      Objective:   Physical Exam  Constitutional: She appears well-developed and well-nourished.  Neck: No JVD present.  Cardiovascular: Normal rate, regular rhythm and normal heart sounds.   No murmur heard. Pulmonary/Chest: Effort normal and breath sounds normal. No respiratory distress. She has no wheezes. She has no rales.  Abdominal: Soft. Bowel sounds are normal. She exhibits no distension. There is no tenderness. There is no rebound and no guarding.  Musculoskeletal: She exhibits no edema.  Lymphadenopathy:    She has no cervical adenopathy.  Vitals reviewed.         Assessment & Plan:  Gastrointestinal hemorrhage associated with duodenal ulcer  Low hemoglobin  Recheck hemoglobin today. If stable recheck in one month. I will repeat Mini-Mental status exam in one month and if no significant change, I plan to start the patient on Aricept and quickly transitioned to namzeric.

## 2015-10-04 DIAGNOSIS — J690 Pneumonitis due to inhalation of food and vomit: Secondary | ICD-10-CM | POA: Diagnosis not present

## 2015-10-04 DIAGNOSIS — A419 Sepsis, unspecified organism: Secondary | ICD-10-CM | POA: Diagnosis not present

## 2015-10-04 DIAGNOSIS — F329 Major depressive disorder, single episode, unspecified: Secondary | ICD-10-CM | POA: Diagnosis not present

## 2015-10-04 DIAGNOSIS — D72829 Elevated white blood cell count, unspecified: Secondary | ICD-10-CM | POA: Diagnosis not present

## 2015-10-04 DIAGNOSIS — K922 Gastrointestinal hemorrhage, unspecified: Secondary | ICD-10-CM | POA: Diagnosis not present

## 2015-10-04 DIAGNOSIS — I129 Hypertensive chronic kidney disease with stage 1 through stage 4 chronic kidney disease, or unspecified chronic kidney disease: Secondary | ICD-10-CM | POA: Diagnosis not present

## 2015-10-04 DIAGNOSIS — J441 Chronic obstructive pulmonary disease with (acute) exacerbation: Secondary | ICD-10-CM | POA: Diagnosis not present

## 2015-10-04 DIAGNOSIS — D62 Acute posthemorrhagic anemia: Secondary | ICD-10-CM | POA: Diagnosis not present

## 2015-10-04 DIAGNOSIS — E785 Hyperlipidemia, unspecified: Secondary | ICD-10-CM | POA: Diagnosis not present

## 2015-10-04 DIAGNOSIS — Z9181 History of falling: Secondary | ICD-10-CM | POA: Diagnosis not present

## 2015-10-04 DIAGNOSIS — Z7951 Long term (current) use of inhaled steroids: Secondary | ICD-10-CM | POA: Diagnosis not present

## 2015-10-04 DIAGNOSIS — N183 Chronic kidney disease, stage 3 (moderate): Secondary | ICD-10-CM | POA: Diagnosis not present

## 2015-10-04 DIAGNOSIS — K269 Duodenal ulcer, unspecified as acute or chronic, without hemorrhage or perforation: Secondary | ICD-10-CM | POA: Diagnosis not present

## 2015-10-04 DIAGNOSIS — M4800 Spinal stenosis, site unspecified: Secondary | ICD-10-CM | POA: Diagnosis not present

## 2015-10-06 DIAGNOSIS — Z9181 History of falling: Secondary | ICD-10-CM | POA: Diagnosis not present

## 2015-10-06 DIAGNOSIS — K922 Gastrointestinal hemorrhage, unspecified: Secondary | ICD-10-CM | POA: Diagnosis not present

## 2015-10-06 DIAGNOSIS — K269 Duodenal ulcer, unspecified as acute or chronic, without hemorrhage or perforation: Secondary | ICD-10-CM | POA: Diagnosis not present

## 2015-10-06 DIAGNOSIS — E785 Hyperlipidemia, unspecified: Secondary | ICD-10-CM | POA: Diagnosis not present

## 2015-10-06 DIAGNOSIS — I129 Hypertensive chronic kidney disease with stage 1 through stage 4 chronic kidney disease, or unspecified chronic kidney disease: Secondary | ICD-10-CM | POA: Diagnosis not present

## 2015-10-06 DIAGNOSIS — J690 Pneumonitis due to inhalation of food and vomit: Secondary | ICD-10-CM | POA: Diagnosis not present

## 2015-10-06 DIAGNOSIS — A419 Sepsis, unspecified organism: Secondary | ICD-10-CM | POA: Diagnosis not present

## 2015-10-06 DIAGNOSIS — D62 Acute posthemorrhagic anemia: Secondary | ICD-10-CM | POA: Diagnosis not present

## 2015-10-06 DIAGNOSIS — Z7951 Long term (current) use of inhaled steroids: Secondary | ICD-10-CM | POA: Diagnosis not present

## 2015-10-06 DIAGNOSIS — N183 Chronic kidney disease, stage 3 (moderate): Secondary | ICD-10-CM | POA: Diagnosis not present

## 2015-10-06 DIAGNOSIS — M4800 Spinal stenosis, site unspecified: Secondary | ICD-10-CM | POA: Diagnosis not present

## 2015-10-06 DIAGNOSIS — D72829 Elevated white blood cell count, unspecified: Secondary | ICD-10-CM | POA: Diagnosis not present

## 2015-10-06 DIAGNOSIS — J441 Chronic obstructive pulmonary disease with (acute) exacerbation: Secondary | ICD-10-CM | POA: Diagnosis not present

## 2015-10-11 ENCOUNTER — Telehealth: Payer: Self-pay | Admitting: Family Medicine

## 2015-10-11 NOTE — Telephone Encounter (Signed)
LMTRC

## 2015-10-11 NOTE — Telephone Encounter (Signed)
Patients daughter Joelene Millin calling regarding her zyrtec and some female problems she is having  [phone]

## 2015-10-13 ENCOUNTER — Ambulatory Visit (INDEPENDENT_AMBULATORY_CARE_PROVIDER_SITE_OTHER): Payer: Medicare Other | Admitting: Family Medicine

## 2015-10-13 ENCOUNTER — Ambulatory Visit: Payer: Medicare Other | Admitting: Family Medicine

## 2015-10-13 ENCOUNTER — Encounter: Payer: Self-pay | Admitting: Family Medicine

## 2015-10-13 VITALS — BP 142/80 | HR 74 | Temp 97.8°F | Resp 16 | Wt 152.0 lb

## 2015-10-13 DIAGNOSIS — L723 Sebaceous cyst: Secondary | ICD-10-CM | POA: Diagnosis not present

## 2015-10-13 DIAGNOSIS — L089 Local infection of the skin and subcutaneous tissue, unspecified: Secondary | ICD-10-CM

## 2015-10-13 MED ORDER — SULFAMETHOXAZOLE-TRIMETHOPRIM 800-160 MG PO TABS: 1.0000 | ORAL_TABLET | Freq: Two times a day (BID) | ORAL | 0 refills | Status: DC

## 2015-10-13 NOTE — Telephone Encounter (Signed)
Spoke to dtr while in [place] today.

## 2015-10-13 NOTE — Progress Notes (Signed)
Subjective:    Patient ID: [name], female    DOB: [birth year], 82 y.o.   MRN: [id number]  HPI Patient is here today with her daughter. She has a draining mass in her right inguinal area. It appears to be an old sebaceous cyst which has become inflamed and infected. It has spontaneously begun to drain through an opening approximately 4 mm in diameter. It is still very tense and hard all the way up to the canal. A wound culture was obtained and sent. Due to pain the patient was unable to tolerate the pressure I was applying to drain completely. Therefore I anesthetized the opening. The opening was enlarged with a pair of hemostats. I then probed the wound thoroughly with Q-tips soaked in hydrogen peroxide. The actual pocket was the size of a golf ball. After all the purulent material was expressed, it was packed with a proximally 6 inches of iodoform gauze. Wound care was discussed Past Medical History:  Diagnosis Date  . CKD (chronic kidney disease) stage 3, GFR 30-59 ml/min   . Hypercholesterolemia   . Hypertension   . Mild acid reflux   . Mitral valve prolapse   . Renal insufficiency   . Spinal stenosis   . Staph infection     history of a wound infection following a previous right hip surgery  . Upper GI bleed    duodenal ulcer (09/2015) s/p endo clipping   Past Surgical History:  Procedure Laterality Date  . ABCESS DRAINAGE    . ABDOMINAL HYSTERECTOMY    . COLONOSCOPY  04/30/01   [id number] hemorrhoids, otherwise, normal rectum/colon/Normal terminal ileum  . COLONOSCOPY  03/02/2007   [id number] sigmoid colon diverticula.  Otherwise no polyps, masses/Normal terminal ileum.  Approximately 10-15 cm of the distal terminal ileum visualized/normal view of the rectum  . ESOPHAGOGASTRODUODENOSCOPY    03/01/2007   [id number] esophagus without evidence of Barrett's, mass/Mild erythema in the antrum.  Distorted pylorus consistent with prior ulcer disease.  Biopsies obtained/ 3. Junction  of D1 and D2 erythematous, edematous, and strictured.  The lumen was narrowed to approximately 10 mm.   . ESOPHAGOGASTRODUODENOSCOPY N/A 09/09/2015   Procedure: ESOPHAGOGASTRODUODENOSCOPY (EGD);  Surgeon: Jerene Bears, MD;  Location: [facility] ENDOSCOPY;  Service: Endoscopy;  Laterality: N/A;  . Left total hip replacement arthroplasty  June of 2001  . Right breast lumpectomy    . Right total hip replacement arthroplasty  August of 2001  . TOTAL KNEE ARTHROPLASTY     Current Outpatient Prescriptions on File Prior to Visit  Medication Sig Dispense Refill  . albuterol (PROVENTIL HFA;VENTOLIN HFA) 108 (90 BASE) MCG/ACT inhaler Inhale 2 puffs into the lungs every 4 (four) hours as needed for wheezing or shortness of breath. 1 Inhaler 0  . albuterol (PROVENTIL) (2.5 MG/3ML) 0.083% nebulizer solution Take 3 mLs (2.5 mg total) by nebulization every 6 (six) hours as needed for wheezing or shortness of breath. 150 mL 0  . amLODipine (NORVASC) 10 MG tablet Take 1 tablet (10 mg total) by mouth daily. 90 tablet 1  . atorvastatin (LIPITOR) 20 MG tablet Take 1 tablet (20 mg total) by mouth daily. 90 tablet 3  . Biotin 2500 MCG CAPS Take 2,500 mg by mouth daily.    . cetirizine (ZYRTEC) 10 MG tablet Take 10 mg by mouth daily.    . citalopram (CELEXA) 20 MG tablet TAKE 1 TABLET DAILY 90 tablet 3  . ferrous sulfate 325 (65 FE) MG tablet Take 325 mg by  mouth 2 (two) times daily with a meal.    . HYDROcodone-acetaminophen (NORCO/VICODIN) 5-325 MG tablet Take 0.5-1 tablets by mouth every 6 (six) hours as needed for moderate pain. 60 tablet 0  . losartan-hydrochlorothiazide (HYZAAR) 50-12.5 MG tablet Take 1 tablet by mouth daily. 30 tablet 11  . metoprolol tartrate (LOPRESSOR) 25 MG tablet Take 1 tablet (25 mg total) by mouth 2 (two) times daily. 60 tablet 0  . montelukast (SINGULAIR) 10 MG tablet Take 1 tablet (10 mg total) by mouth at bedtime. 30 tablet 3  . Multiple Vitamin (MULTIVITAMIN WITH MINERALS) TABS tablet Take 1  tablet by mouth daily.    . pantoprazole (PROTONIX) 40 MG tablet Take 1 tablet (40 mg total) by mouth 2 (two) times daily. For 4weeks then daily 60 tablet 1  . vitamin B-12 (CYANOCOBALAMIN) 500 MCG tablet Take 500 mcg by mouth daily.    . butalbital-aspirin-caffeine (FIORINAL) 50-325-40 MG per capsule TAKE ONE CAPSULE BY MOUTH EVERY 8 HOURS AS NEEDED FOR HEADACHE (Patient not taking: Reported on 09/28/2015) 30 capsule 0  . clotrimazole-betamethasone (LOTRISONE) cream Apply 1 application topically 2 (two) times daily. (Patient not taking: Reported on 09/28/2015) 30 g 0  . gabapentin (NEURONTIN) 300 MG capsule Take 1 capsule (300 mg total) by mouth at bedtime. (Patient not taking: Reported on 09/28/2015)     No current facility-administered medications on file prior to visit.    Allergies  Allergen Reactions  . Demerol [Meperidine] Other (See Comments)    Altered mental status  . Meperidine Hcl Other (See Comments)    Altered mental status   Social History   Social History  . Marital status: Widowed    Spouse name: N/A  . Number of children: N/A  . Years of education: N/A   Occupational History  . Not on file.   Social History Main Topics  . Smoking status: Never Smoker  . Smokeless tobacco: Never Used  . Alcohol use No  . Drug use: No  . Sexual activity: No     Comment: widowed   Other Topics Concern  . Not on file   Social History Narrative  . No narrative on file      Review of Systems  All other systems reviewed and are negative.      Objective:   Physical Exam  Cardiovascular: Normal rate, regular rhythm and normal heart sounds.   Pulmonary/Chest: Effort normal and breath sounds normal.  Lymphadenopathy:       Right: Inguinal adenopathy present.  Skin: Skin is warm. There is erythema.          Assessment & Plan:  Infected sebaceous cyst - Plan: sulfamethoxazole-trimethoprim (BACTRIM DS,SEPTRA DS) 800-160 MG tablet, CULTURE, ROUTINE-ABSCESS  Please see  the history of present illness. Wound culture was sent. Start the patient on Bactrim double strength by mouth twice a day to cover for possible staph infection. Recheck on Monday

## 2015-10-14 ENCOUNTER — Encounter: Payer: Self-pay | Admitting: Family Medicine

## 2015-10-14 ENCOUNTER — Ambulatory Visit (INDEPENDENT_AMBULATORY_CARE_PROVIDER_SITE_OTHER): Payer: Medicare Other | Admitting: Family Medicine

## 2015-10-14 VITALS — BP 136/68 | HR 68 | Temp 98.5°F | Resp 16 | Wt 152.0 lb

## 2015-10-14 DIAGNOSIS — R1904 Left lower quadrant abdominal swelling, mass and lump: Secondary | ICD-10-CM

## 2015-10-14 NOTE — Progress Notes (Signed)
Subjective:    Patient ID: [name], female    DOB: [birth year], 82 y.o.   MRN: [id number]  HPI There is a large mass approximately 6 cm in diameter in the left lower quadrant area the patient's daughter felt that today while changing her clothes. Sure enough the left lower quadrant in the pannus just above the left inguinal crease there is a 5-6 cm firm mass. It is difficult to ascertain what this is. She did have small bilateral inguinal hernias seen on CT scan but this appears to be superior to that and it does not have the texture of a hernia. Furthermore is not reducible. The mass is nontender. She denies any constipation. She denies any nausea or vomiting or fever. She denies any blood in her stool Past Medical History:  Diagnosis Date  . CKD (chronic kidney disease) stage 3, GFR 30-59 ml/min   . Hypercholesterolemia   . Hypertension   . Mild acid reflux   . Mitral valve prolapse   . Renal insufficiency   . Spinal stenosis   . Staph infection     history of a wound infection following a previous right hip surgery  . Upper GI bleed    duodenal ulcer (09/2015) s/p endo clipping   Past Surgical History:  Procedure Laterality Date  . ABCESS DRAINAGE    . ABDOMINAL HYSTERECTOMY    . COLONOSCOPY  04/30/01   [id number] hemorrhoids, otherwise, normal rectum/colon/Normal terminal ileum  . COLONOSCOPY  03/02/2007   [id number] sigmoid colon diverticula.  Otherwise no polyps, masses/Normal terminal ileum.  Approximately 10-15 cm of the distal terminal ileum visualized/normal view of the rectum  . ESOPHAGOGASTRODUODENOSCOPY    03/01/2007   [id number] esophagus without evidence of Barrett's, mass/Mild erythema in the antrum.  Distorted pylorus consistent with prior ulcer disease.  Biopsies obtained/ 3. Junction of D1 and D2 erythematous, edematous, and strictured.  The lumen was narrowed to approximately 10 mm.   . ESOPHAGOGASTRODUODENOSCOPY N/A 09/09/2015   Procedure:  ESOPHAGOGASTRODUODENOSCOPY (EGD);  Surgeon: Jerene Bears, MD;  Location: [facility] ENDOSCOPY;  Service: Endoscopy;  Laterality: N/A;  . Left total hip replacement arthroplasty  June of 2001  . Right breast lumpectomy    . Right total hip replacement arthroplasty  August of 2001  . TOTAL KNEE ARTHROPLASTY     Current Outpatient Prescriptions on File Prior to Visit  Medication Sig Dispense Refill  . albuterol (PROVENTIL HFA;VENTOLIN HFA) 108 (90 BASE) MCG/ACT inhaler Inhale 2 puffs into the lungs every 4 (four) hours as needed for wheezing or shortness of breath. 1 Inhaler 0  . albuterol (PROVENTIL) (2.5 MG/3ML) 0.083% nebulizer solution Take 3 mLs (2.5 mg total) by nebulization every 6 (six) hours as needed for wheezing or shortness of breath. 150 mL 0  . amLODipine (NORVASC) 10 MG tablet Take 1 tablet (10 mg total) by mouth daily. 90 tablet 1  . atorvastatin (LIPITOR) 20 MG tablet Take 1 tablet (20 mg total) by mouth daily. 90 tablet 3  . Biotin 2500 MCG CAPS Take 2,500 mg by mouth daily.    . cetirizine (ZYRTEC) 10 MG tablet Take 10 mg by mouth daily.    . citalopram (CELEXA) 20 MG tablet TAKE 1 TABLET DAILY 90 tablet 3  . ferrous sulfate 325 (65 FE) MG tablet Take 325 mg by mouth 2 (two) times daily with a meal.    . HYDROcodone-acetaminophen (NORCO/VICODIN) 5-325 MG tablet Take 0.5-1 tablets by mouth every 6 (six) hours as needed for  moderate pain. 60 tablet 0  . losartan-hydrochlorothiazide (HYZAAR) 50-12.5 MG tablet Take 1 tablet by mouth daily. 30 tablet 11  . metoprolol tartrate (LOPRESSOR) 25 MG tablet Take 1 tablet (25 mg total) by mouth 2 (two) times daily. 60 tablet 0  . montelukast (SINGULAIR) 10 MG tablet Take 1 tablet (10 mg total) by mouth at bedtime. 30 tablet 3  . Multiple Vitamin (MULTIVITAMIN WITH MINERALS) TABS tablet Take 1 tablet by mouth daily.    . pantoprazole (PROTONIX) 40 MG tablet Take 1 tablet (40 mg total) by mouth 2 (two) times daily. For 4weeks then daily 60 tablet 1  .  sulfamethoxazole-trimethoprim (BACTRIM DS,SEPTRA DS) 800-160 MG tablet Take 1 tablet by mouth 2 (two) times daily. 14 tablet 0  . vitamin B-12 (CYANOCOBALAMIN) 500 MCG tablet Take 500 mcg by mouth daily.    . butalbital-aspirin-caffeine (FIORINAL) 50-325-40 MG per capsule TAKE ONE CAPSULE BY MOUTH EVERY 8 HOURS AS NEEDED FOR HEADACHE (Patient not taking: Reported on 09/28/2015) 30 capsule 0  . clotrimazole-betamethasone (LOTRISONE) cream Apply 1 application topically 2 (two) times daily. (Patient not taking: Reported on 09/28/2015) 30 g 0  . gabapentin (NEURONTIN) 300 MG capsule Take 1 capsule (300 mg total) by mouth at bedtime. (Patient not taking: Reported on 09/28/2015)     No current facility-administered medications on file prior to visit.    Allergies  Allergen Reactions  . Demerol [Meperidine] Other (See Comments)    Altered mental status  . Meperidine Hcl Other (See Comments)    Altered mental status   Social History   Social History  . Marital status: Widowed    Spouse name: N/A  . Number of children: N/A  . Years of education: N/A   Occupational History  . Not on file.   Social History Main Topics  . Smoking status: Never Smoker  . Smokeless tobacco: Never Used  . Alcohol use No  . Drug use: No  . Sexual activity: No     Comment: widowed   Other Topics Concern  . Not on file   Social History Narrative  . No narrative on file      Review of Systems  All other systems reviewed and are negative.      Objective:   Physical Exam  Cardiovascular: Normal rate, regular rhythm and normal heart sounds.   Pulmonary/Chest: Effort normal and breath sounds normal.  Abdominal: Soft. Bowel sounds are normal. She exhibits mass.  Lymphadenopathy:       Right: Inguinal adenopathy present.  Skin: Skin is warm. No erythema.          Assessment & Plan:  Abdominal mass, LLQ (left lower quadrant) - Plan: CT Abdomen Pelvis W Contrast  I am concerned. I do not know what  this mass is. He does not feel like a hernia. Therefore I recommended a CT scan of the abdomen and pelvis with contrast to evaluate further

## 2015-10-16 LAB — CULTURE, ROUTINE-ABSCESS
Gram Stain: NONE SEEN
Gram Stain: NONE SEEN

## 2015-10-17 ENCOUNTER — Ambulatory Visit (INDEPENDENT_AMBULATORY_CARE_PROVIDER_SITE_OTHER): Payer: Medicare Other | Admitting: Family Medicine

## 2015-10-17 ENCOUNTER — Encounter: Payer: Self-pay | Admitting: Family Medicine

## 2015-10-17 VITALS — BP 130/70 | HR 68 | Temp 98.1°F | Resp 18 | Wt 151.0 lb

## 2015-10-17 DIAGNOSIS — L02219 Cutaneous abscess of trunk, unspecified: Secondary | ICD-10-CM

## 2015-10-17 DIAGNOSIS — L03319 Cellulitis of trunk, unspecified: Secondary | ICD-10-CM

## 2015-10-17 MED ORDER — SULFAMETHOXAZOLE-TRIMETHOPRIM 800-160 MG PO TABS: 1.0000 | ORAL_TABLET | Freq: Two times a day (BID) | ORAL | 0 refills | Status: DC

## 2015-10-17 NOTE — Progress Notes (Signed)
Subjective:    Patient ID: [name], female    DOB: 09/26/1932, 82 y.o.   MRN: [id number]  HPI  10/13/15 Patient is here today with her daughter. She has a draining mass in her right inguinal area. It appears to be an old sebaceous cyst which has become inflamed and infected. It has spontaneously begun to drain through an opening approximately 4 mm in diameter. It is still very tense and hard all the way up to the canal. A wound culture was obtained and sent. Due to pain the patient was unable to tolerate the pressure I was applying to drain completely. Therefore I anesthetized the opening. The opening was enlarged with a pair of hemostats. I then probed the wound thoroughly with Q-tips soaked in hydrogen peroxide. The actual pocket was the size of a golf ball. After all the purulent material was expressed, it was packed with a proximally 6 inches of iodoform gauze. Wound care was discussed.  At that time, my plan was: Please see the history of present illness. Wound culture was sent. Start the patient on Bactrim double strength by mouth twice a day to cover for possible staph infection. Recheck on Monday  10/17/15 Wound culture returned positive for MRSA sensitive to Bactrim. She is here today for follow-up..  The area on her right labia is nowhere near is erythematous as it was last week. There is still trace purulent material coming from the I&D site. There is still right inguinal lymphadenopathy but it is less painful, less swollen, and less erythematous Past Medical History:  Diagnosis Date  . CKD (chronic kidney disease) stage 3, GFR 30-59 ml/min   . Hypercholesterolemia   . Hypertension   . Mild acid reflux   . Mitral valve prolapse   . Renal insufficiency   . Spinal stenosis   . Staph infection     history of a wound infection following a previous right hip surgery  . Upper GI bleed    duodenal ulcer (09/2015) s/p endo clipping   Past Surgical History:  Procedure Laterality  Date  . ABCESS DRAINAGE    . ABDOMINAL HYSTERECTOMY    . COLONOSCOPY  04/30/01   [id number] hemorrhoids, otherwise, normal rectum/colon/Normal terminal ileum  . COLONOSCOPY  03/02/2007   [id number] sigmoid colon diverticula.  Otherwise no polyps, masses/Normal terminal ileum.  Approximately 10-15 cm of the distal terminal ileum visualized/normal view of the rectum  . ESOPHAGOGASTRODUODENOSCOPY    03/01/2007   [id number] esophagus without evidence of Barrett's, mass/Mild erythema in the antrum.  Distorted pylorus consistent with prior ulcer disease.  Biopsies obtained/ 3. Junction of D1 and D2 erythematous, edematous, and strictured.  The lumen was narrowed to approximately 10 mm.   . ESOPHAGOGASTRODUODENOSCOPY N/A 09/09/2015   Procedure: ESOPHAGOGASTRODUODENOSCOPY (EGD);  Surgeon: Jerene Bears, MD;  Location: [facility] ENDOSCOPY;  Service: Endoscopy;  Laterality: N/A;  . Left total hip replacement arthroplasty  June of 2001  . Right breast lumpectomy    . Right total hip replacement arthroplasty  August of 2001  . TOTAL KNEE ARTHROPLASTY     Current Outpatient Prescriptions on File Prior to Visit  Medication Sig Dispense Refill  . albuterol (PROVENTIL HFA;VENTOLIN HFA) 108 (90 BASE) MCG/ACT inhaler Inhale 2 puffs into the lungs every 4 (four) hours as needed for wheezing or shortness of breath. 1 Inhaler 0  . albuterol (PROVENTIL) (2.5 MG/3ML) 0.083% nebulizer solution Take 3 mLs (2.5 mg total) by nebulization every 6 (six) hours as needed for  wheezing or shortness of breath. 150 mL 0  . amLODipine (NORVASC) 10 MG tablet Take 1 tablet (10 mg total) by mouth daily. 90 tablet 1  . atorvastatin (LIPITOR) 20 MG tablet Take 1 tablet (20 mg total) by mouth daily. 90 tablet 3  . Biotin 2500 MCG CAPS Take 2,500 mg by mouth daily.    . butalbital-aspirin-caffeine (FIORINAL) 50-325-40 MG per capsule TAKE ONE CAPSULE BY MOUTH EVERY 8 HOURS AS NEEDED FOR HEADACHE (Patient not taking: Reported on 09/28/2015) 30  capsule 0  . cetirizine (ZYRTEC) 10 MG tablet Take 10 mg by mouth daily.    . citalopram (CELEXA) 20 MG tablet TAKE 1 TABLET DAILY 90 tablet 3  . clotrimazole-betamethasone (LOTRISONE) cream Apply 1 application topically 2 (two) times daily. (Patient not taking: Reported on 09/28/2015) 30 g 0  . ferrous sulfate 325 (65 FE) MG tablet Take 325 mg by mouth 2 (two) times daily with a meal.    . gabapentin (NEURONTIN) 300 MG capsule Take 1 capsule (300 mg total) by mouth at bedtime. (Patient not taking: Reported on 09/28/2015)    . HYDROcodone-acetaminophen (NORCO/VICODIN) 5-325 MG tablet Take 0.5-1 tablets by mouth every 6 (six) hours as needed for moderate pain. 60 tablet 0  . losartan-hydrochlorothiazide (HYZAAR) 50-12.5 MG tablet Take 1 tablet by mouth daily. 30 tablet 11  . metoprolol tartrate (LOPRESSOR) 25 MG tablet Take 1 tablet (25 mg total) by mouth 2 (two) times daily. 60 tablet 0  . montelukast (SINGULAIR) 10 MG tablet Take 1 tablet (10 mg total) by mouth at bedtime. 30 tablet 3  . Multiple Vitamin (MULTIVITAMIN WITH MINERALS) TABS tablet Take 1 tablet by mouth daily.    . pantoprazole (PROTONIX) 40 MG tablet Take 1 tablet (40 mg total) by mouth 2 (two) times daily. For 4weeks then daily 60 tablet 1  . sulfamethoxazole-trimethoprim (BACTRIM DS,SEPTRA DS) 800-160 MG tablet Take 1 tablet by mouth 2 (two) times daily. 14 tablet 0  . vitamin B-12 (CYANOCOBALAMIN) 500 MCG tablet Take 500 mcg by mouth daily.     No current facility-administered medications on file prior to visit.    Allergies  Allergen Reactions  . Demerol [Meperidine] Other (See Comments)    Altered mental status  . Meperidine Hcl Other (See Comments)    Altered mental status   Social History   Social History  . Marital status: Widowed    Spouse name: N/A  . Number of children: N/A  . Years of education: N/A   Occupational History  . Not on file.   Social History Main Topics  . Smoking status: Never Smoker  .  Smokeless tobacco: Never Used  . Alcohol use No  . Drug use: No  . Sexual activity: No     Comment: widowed   Other Topics Concern  . Not on file   Social History Narrative  . No narrative on file      Review of Systems  All other systems reviewed and are negative.      Objective:   Physical Exam  Cardiovascular: Normal rate, regular rhythm and normal heart sounds.   Pulmonary/Chest: Effort normal and breath sounds normal.  Lymphadenopathy:       Right: Inguinal adenopathy present.  Skin: Skin is warm. There is erythema.          Assessment & Plan:  Cellulitis and abscess of trunk - Plan: sulfamethoxazole-trimethoprim (BACTRIM DS,SEPTRA DS) 800-160 MG tablet  Clinically improving. I will extend antibiotics for one additional week.  Recheck in one week. Wound care was discussed. Of note, the mass that I palpated in the left lower quadrant on Friday is less noticeable today and has more of a cylindrical shape consistent with possibly her intestine.

## 2015-10-24 ENCOUNTER — Ambulatory Visit: Payer: Medicare Other | Admitting: Family Medicine

## 2015-10-26 ENCOUNTER — Other Ambulatory Visit: Payer: Self-pay | Admitting: Family Medicine

## 2015-10-26 NOTE — Telephone Encounter (Signed)
Refill appropriate and filled per protocol. 

## 2015-10-27 ENCOUNTER — Encounter: Payer: Self-pay | Admitting: Family Medicine

## 2015-10-27 ENCOUNTER — Ambulatory Visit (INDEPENDENT_AMBULATORY_CARE_PROVIDER_SITE_OTHER): Payer: Medicare Other | Admitting: Family Medicine

## 2015-10-27 VITALS — BP 180/100 | HR 78 | Temp 98.0°F | Resp 16 | Wt 152.0 lb

## 2015-10-27 DIAGNOSIS — R1904 Left lower quadrant abdominal swelling, mass and lump: Secondary | ICD-10-CM | POA: Diagnosis not present

## 2015-10-27 DIAGNOSIS — L02219 Cutaneous abscess of trunk, unspecified: Secondary | ICD-10-CM

## 2015-10-27 DIAGNOSIS — L03319 Cellulitis of trunk, unspecified: Secondary | ICD-10-CM

## 2015-10-27 NOTE — Progress Notes (Signed)
Subjective:    Patient ID: [name], female    DOB: 01-18-1933, 82 y.o.   MRN: [id number]  HPI  10/13/15 Patient is here today with her daughter. She has a draining mass in her right inguinal area. It appears to be an old sebaceous cyst which has become inflamed and infected. It has spontaneously begun to drain through an opening approximately 4 mm in diameter. It is still very tense and hard all the way up to the canal. A wound culture was obtained and sent. Due to pain the patient was unable to tolerate the pressure I was applying to drain completely. Therefore I anesthetized the opening. The opening was enlarged with a pair of hemostats. I then probed the wound thoroughly with Q-tips soaked in hydrogen peroxide. The actual pocket was the size of a golf ball. After all the purulent material was expressed, it was packed with a proximally 6 inches of iodoform gauze. Wound care was discussed.  At that time, my plan was: Please see the history of present illness. Wound culture was sent. Start the patient on Bactrim double strength by mouth twice a day to cover for possible staph infection. Recheck on Monday  10/17/15 Wound culture returned positive for MRSA sensitive to Bactrim. She is here today for follow-up..  The area on her right labia is nowhere near is erythematous as it was last week. There is still trace purulent material coming from the I&D site. There is still right inguinal lymphadenopathy but it is less painful, less swollen, and less erythematous.  At that time, my plan was: Clinically improving. I will extend antibiotics for one additional week. Recheck in one week. Wound care was discussed. Of note, the mass that I palpated in the left lower quadrant on Friday is less noticeable today and has more of a cylindrical shape consistent with possibly her intestine.  10/27/15 The area in her right inguinal canal has essentially completely healed. There is no visible defect. Erythema has  completely subsided. There is no pain or swelling in that area. There is still a palpable inguinal lymph node on the right side which I believe is reactive and due to residual healing from her recent MRSA infection. There is still a palpable oblong lump in her left lower quadrant.  It is not painful. It moves freely. It is approximately half the size of my fist. CT scan is scheduled for later this week Past Medical History:  Diagnosis Date  . CKD (chronic kidney disease) stage 3, GFR 30-59 ml/min   . Hypercholesterolemia   . Hypertension   . Mild acid reflux   . Mitral valve prolapse   . Renal insufficiency   . Spinal stenosis   . Staph infection     history of a wound infection following a previous right hip surgery  . Upper GI bleed    duodenal ulcer (09/2015) s/p endo clipping   Past Surgical History:  Procedure Laterality Date  . ABCESS DRAINAGE    . ABDOMINAL HYSTERECTOMY    . COLONOSCOPY  04/30/01   [id number] hemorrhoids, otherwise, normal rectum/colon/Normal terminal ileum  . COLONOSCOPY  03/02/2007   [id number] sigmoid colon diverticula.  Otherwise no polyps, masses/Normal terminal ileum.  Approximately 10-15 cm of the distal terminal ileum visualized/normal view of the rectum  . ESOPHAGOGASTRODUODENOSCOPY    03/01/2007   [id number] esophagus without evidence of Barrett's, mass/Mild erythema in the antrum.  Distorted pylorus consistent with prior ulcer disease.  Biopsies obtained/ 3. Junction of  D1 and D2 erythematous, edematous, and strictured.  The lumen was narrowed to approximately 10 mm.   . ESOPHAGOGASTRODUODENOSCOPY N/A 09/09/2015   Procedure: ESOPHAGOGASTRODUODENOSCOPY (EGD);  Surgeon: Jerene Bears, MD;  Location: [facility] ENDOSCOPY;  Service: Endoscopy;  Laterality: N/A;  . Left total hip replacement arthroplasty  June of 2001  . Right breast lumpectomy    . Right total hip replacement arthroplasty  August of 2001  . TOTAL KNEE ARTHROPLASTY     Current Outpatient  Prescriptions on File Prior to Visit  Medication Sig Dispense Refill  . albuterol (PROVENTIL HFA;VENTOLIN HFA) 108 (90 BASE) MCG/ACT inhaler Inhale 2 puffs into the lungs every 4 (four) hours as needed for wheezing or shortness of breath. 1 Inhaler 0  . albuterol (PROVENTIL) (2.5 MG/3ML) 0.083% nebulizer solution Take 3 mLs (2.5 mg total) by nebulization every 6 (six) hours as needed for wheezing or shortness of breath. 150 mL 0  . amLODipine (NORVASC) 10 MG tablet Take 1 tablet (10 mg total) by mouth daily. 90 tablet 1  . atorvastatin (LIPITOR) 20 MG tablet Take 1 tablet (20 mg total) by mouth daily. 90 tablet 3  . Biotin 2500 MCG CAPS Take 2,500 mg by mouth daily.    . butalbital-aspirin-caffeine (FIORINAL) 50-325-40 MG per capsule TAKE ONE CAPSULE BY MOUTH EVERY 8 HOURS AS NEEDED FOR HEADACHE 30 capsule 0  . cetirizine (ZYRTEC) 10 MG tablet Take 10 mg by mouth daily.    . citalopram (CELEXA) 20 MG tablet TAKE 1 TABLET DAILY 90 tablet 3  . clotrimazole-betamethasone (LOTRISONE) cream Apply 1 application topically 2 (two) times daily. 30 g 0  . ferrous sulfate 325 (65 FE) MG tablet Take 325 mg by mouth 2 (two) times daily with a meal.    . gabapentin (NEURONTIN) 300 MG capsule Take 1 capsule (300 mg total) by mouth at bedtime.    Marland Kitchen HYDROcodone-acetaminophen (NORCO/VICODIN) 5-325 MG tablet Take 0.5-1 tablets by mouth every 6 (six) hours as needed for moderate pain. 60 tablet 0  . losartan-hydrochlorothiazide (HYZAAR) 50-12.5 MG tablet Take 1 tablet by mouth daily. 30 tablet 11  . metoprolol tartrate (LOPRESSOR) 25 MG tablet TAKE 1 TABLET BY MOUTH TWICE A DAY 60 tablet 1  . montelukast (SINGULAIR) 10 MG tablet Take 1 tablet (10 mg total) by mouth at bedtime. 30 tablet 3  . Multiple Vitamin (MULTIVITAMIN WITH MINERALS) TABS tablet Take 1 tablet by mouth daily.    . pantoprazole (PROTONIX) 40 MG tablet Take 1 tablet (40 mg total) by mouth 2 (two) times daily. For 4weeks then daily 60 tablet 1  .  sulfamethoxazole-trimethoprim (BACTRIM DS,SEPTRA DS) 800-160 MG tablet Take 1 tablet by mouth 2 (two) times daily. 14 tablet 0  . sulfamethoxazole-trimethoprim (BACTRIM DS,SEPTRA DS) 800-160 MG tablet Take 1 tablet by mouth 2 (two) times daily. 14 tablet 0  . vitamin B-12 (CYANOCOBALAMIN) 500 MCG tablet Take 500 mcg by mouth daily.     No current facility-administered medications on file prior to visit.    Allergies  Allergen Reactions  . Demerol [Meperidine] Other (See Comments)    Altered mental status  . Meperidine Hcl Other (See Comments)    Altered mental status   Social History   Social History  . Marital status: Widowed    Spouse name: N/A  . Number of children: N/A  . Years of education: N/A   Occupational History  . Not on file.   Social History Main Topics  . Smoking status: Never Smoker  .  Smokeless tobacco: Never Used  . Alcohol use No  . Drug use: No  . Sexual activity: No     Comment: widowed   Other Topics Concern  . Not on file   Social History Narrative  . No narrative on file      Review of Systems  All other systems reviewed and are negative.      Objective:   Physical Exam  Cardiovascular: Normal rate, regular rhythm and normal heart sounds.   Pulmonary/Chest: Effort normal and breath sounds normal.  Abdominal: She exhibits mass.  Lymphadenopathy:       Right: Inguinal adenopathy present.  Skin: Skin is warm. No rash noted. No erythema.          Assessment & Plan:  Cellulitis and abscess of trunk  Abdominal mass, LLQ (left lower quadrant)  Abscess from infected sebaceous cyst in the right inguinal canal has completely healed. No further treatment is necessary. There is a reactive lymph node that should resolve on its own. Await the CT results to determine the etiology of the mass palpable in the left lower quadrant

## 2015-11-01 ENCOUNTER — Telehealth: Payer: Self-pay | Admitting: Family Medicine

## 2015-11-01 NOTE — Telephone Encounter (Signed)
Ms. Molinar called with concern regarding the CT scan. She thought it would be like a MRI where she'd be enclosed in "a tube" and said "she can't take her head being in anything." I informed her that a CT is shaped like a donut and her head won't be enclosed. She said she felt better about that but she's unsure of when she needs to arrive to her appt. I told her it's tomorrow at 1:30pm but was unsure about the arrival time. Please give her a phone call regarding this.  Pt's ph# [phone] Thank you.

## 2015-11-01 NOTE — Telephone Encounter (Signed)
Pt aware of pre-arrival time

## 2015-11-02 ENCOUNTER — Inpatient Hospital Stay: Admission: RE | Admit: 2015-11-02 | Payer: Medicare Other | Source: Ambulatory Visit

## 2015-11-04 ENCOUNTER — Ambulatory Visit: Payer: Medicare Other | Admitting: Family Medicine

## 2015-11-10 ENCOUNTER — Ambulatory Visit (INDEPENDENT_AMBULATORY_CARE_PROVIDER_SITE_OTHER): Payer: Medicare Other | Admitting: Family Medicine

## 2015-11-10 VITALS — BP 146/90 | HR 76 | Temp 97.6°F | Resp 16 | Wt 155.0 lb

## 2015-11-10 DIAGNOSIS — F0150 Vascular dementia without behavioral disturbance: Secondary | ICD-10-CM

## 2015-11-10 DIAGNOSIS — D62 Acute posthemorrhagic anemia: Secondary | ICD-10-CM

## 2015-11-10 LAB — CBC WITH DIFFERENTIAL/PLATELET
BASOS PCT: 0 %
Basophils Absolute: 0 cells/uL (ref 0–200)
EOS ABS: 528 {cells}/uL — AB (ref 15–500)
Eosinophils Relative: 6 %
HEMATOCRIT: 39.1 % (ref 35.0–45.0)
Hemoglobin: 12.3 g/dL (ref 12.0–15.0)
LYMPHS PCT: 25 %
Lymphs Abs: 2200 cells/uL (ref 850–3900)
MCH: 26.2 pg — ABNORMAL LOW (ref 27.0–33.0)
MCHC: 31.5 g/dL — ABNORMAL LOW (ref 32.0–36.0)
MCV: 83.4 fL (ref 80.0–100.0)
MONO ABS: 792 {cells}/uL (ref 200–950)
MPV: 9.9 fL (ref 7.5–12.5)
Monocytes Relative: 9 %
NEUTROS PCT: 60 %
Neutro Abs: 5280 cells/uL (ref 1500–7800)
Platelets: 352 10*3/uL (ref 140–400)
RBC: 4.69 MIL/uL (ref 3.80–5.10)
RDW: 17 % — AB (ref 11.0–15.0)
WBC: 8.8 10*3/uL (ref 3.8–10.8)

## 2015-11-10 MED ORDER — DONEPEZIL HCL 10 MG PO TABS: 10.0000 mg | ORAL_TABLET | Freq: Every day | ORAL | 5 refills | Status: DC

## 2015-11-10 NOTE — Progress Notes (Signed)
Subjective:    Patient ID: [name], female    DOB: 08/03/1932, 82 y.o.   MRN: [id number]  HPI  Recently admitted to the hospital for shortness of breath secondary to asthma exacerbation complicated by aspiration pneumonia. Also developed an upper GI bleed secondary to duodenal ulcers while in the hospital. I have copied relevant portions of the discharge summary and included them below for my reference:  Admit date: 09/03/2015 Discharge date: 09/16/2015  Time spent:45 minutes  Recommendations for Outpatient Follow-up:  1. PCP in 1 week   Discharge Diagnoses:   Asthma exacerbation  Upper Gi bleed  Duodenal ulcer  Sepsis  Aspiration pneumonia  CKD (chronic kidney disease) stage 3, GFR 30-59 ml/min  Hypoxemia  Hypokalemia  Asthma exacerbation  Melena  Acute blood loss anemia  Duodenal ulcer hemorrhage  Leukocytosis   Discharge Condition: stable  Diet recommendation: low sodium  Filed Weights   09/09/15 1619 09/10/15 0427 09/14/15 1606  Weight: 73.2 kg (161 lb 6 oz) 74 kg (163 lb 2.3 oz) 69.4 kg (153 lb)    History of present illness:  82 y.o. female with past medical history significant for hypertension, spinal stenosis, dyslipidemia, asthma. Patient presented to [facility] with worsening shortness of breath over past 3 days prior to this admission. She used at home inhalers with no significant symptomatic relief. She initially presented to urgent care and found to be hypoxic around 80s and she was referred to emergency department for further evaluation  Hospital Course:   Acute asthma exacerbation -improved, resolved  Acute blood loss anemia / Acute upper GI bleed secondary to duodenal bulb hemorrhage / Multiple duodenal ulcers one with visible vessel - developed blood and tarry stools after admission overnight on 6/21 -Gi was consulted, she had an EGD on 6/23 - EGD 09/09/2015 - LA Grade D reflux esophagitis. Multiple  non-obstructing duodenal ulcers, 1 with a visible vessel. H/Pylori negative. - s/p injection and Endo Clipping of ulcer with visible vessel, was on IV PPI, now changed to PO - s/p 2 units PRBC transfusion, no further bleeding now - CT abdomen pelvis without findings of intraabdominal bleed - GI signed off on 09/12/2015  Sepsis, unspecified organism / Aspiration pneumonia - Hospitalization was also complicated by Sepsis/aspiration pnuemonia - leukocytosis initially thought to be secondary to steroids.  - Then sepsis criteria met on 6/22. WBC count up to 43.6. RR was 14-20 and HR 74-105 - CXR with RLL pneumonia and she was started on zosyn 6/23. She was also given PO vanco 6/23 for possible C.diff colitis but stopped 09/10/2015 because C. difficile PCR negative - Blood cultures so far show no growth - Leukocytosis is improving,afebrile now - Day 6 of Zosyn, and 2days of augmentin, improving, Abx stopped - Per SLP study - thin liquids, meds with pure.  -leukocytosis continues to improve from 43K at peak to 19K now  Ileus -resolved -tolerating Po and Having loose stools  Diarrhea -due to Abx, resolving ileus -Cdiff PCR negative -improving  Acute respiratory failure with hypoxia /Acute Asthma exacerbation  -improved, had Asthma exacerbation and Asp PNA  Acute metabolic encephalopathy - due to Sepsis/Aspiration pneumonia - MRI with no acute intracranial findings - improved back to baseline  Accelerated hypertension / Essential hypertension - Continue Norvasc and metoprolol   Chronic kidney disease stage III - Baseline creatinine 1.4 about 10 months ago - Creatinine on this admission 1.09 and up to 1.4, still within baseline range   Hypokalemia - Likely secondary  to nebulizer treatments - Potassium subsequently within normal limits  Depression - held celexa 6/23 due to lethargy - resumed now  Dyslipidemia - Continue Lipitor 20 mg daily   Steroid induced  hyperglycemia - A1c is 5.6 - Continue sliding scale insulin - CBGs in past 24 hours:114, 125, 124  Recent falls - PT evaluation - HHPT recommended   Consultants:   Physical therapy   Pulmonary - respiratory team   Gastroenterology  Procedures:   EGD by Dr. Hilarie Fredrickson 09/09/2015 - LA Grade D reflux esophagitis. No gross lesions in the stomach. Blood in the duodenal bulb and in the second portion of the duodenum. Multiple non-obstructing duodenal ulcers, 1 with a visible vessel. Injected.   Protonix infusion 6/23 -->  1 U PRBC 09/10/2015   1 U PRBC 09/12/2015    09/27/15 Since discharge from the hospital, her daughter is very concerned about memory loss. Patient is becoming very forgetful. She is forgetting the names of family members including spouse is a sons and daughters. She is also becoming confused particularly at night. She was sundowning in the hospital. She is not taking any iron. She is taking a proton pump inhibitor twice a day. Her breathing has improved. She denies any fevers or chills or coughing. She denies any black tarry stools or blood in her stool. She denies any fevers or chills. However she has lost significant weight since last time I saw her. She is still not eating well. She has poor appetite.  At that time, my plan was: Recheck a CBC to monitor her anemia. Recommended iron sulfate 325 mg by mouth twice a day. Recommended in short 2 cans per day to increase her protein consumption and correct her protein calorie malnutrition. Her blood pressures elevated today but I hesitate to make any changes given how weak she appears. Recheck blood pressure in 2 weeks. In 2 weeks I would also perform a Mini-Mental status exam. Hopefully that time the patient is closer to her baseline and we will get a more accurate assessment of her underlying mental status. She very well could be developing some mild dementia. Recommended avoidance of all NSAIDs area she can use Tylenol and/or  hydrocodone for pain.  09/30/15 Hemoglobin returned low at 8.7. My partner discussed the case with Dr. Hilarie Fredrickson with GI recommended ER evaluation and possible admission for repeat EGD as he was concerned that she may be rebleeding from her duodenal ulcers. At the emergency room, he hemoglobin was found to be 8.6.It was felt that she was stable to go home and follow-up with me after the emergency room physician discussed the case with the GI physician given the fact that hemoglobin had been stable for 24 hours. She is here today to recheck a CBC. Overall she is doing well. She denies any chest pain shortness of breath and dyspnea on exertion. She does complain of fatigue. She is not tachycardic. Denies any black tarry stools. Stat CBC today at the office shows a hemoglobin of 8.7 which is stable  10/03/15 Hemoglobin is stable at last office visit and 8.7. She has increased her iron to 325 mg by mouth twice a day. She denies any bleeding or bruising although she does have black stools due to iron. Her weight is up 1 pound in her blood pressure stable. I did perform Mini-Mental Status exam today. She is able to get 4 out of 5 questions correct on the date. She was able to remember 2 out of 3 objects on  recall. She was unable to perform serial sevens or spell world backward. On mini cog assessment/clock drawing. The patient was able to draw a clock but only able to get 6 numbers. All of them were lumped into the upper right quadrant of the clock. She was unable to draw the hands on the clock to state the appropriate time. All this is consistent with moderate dementia.  At that time, my plan was: Recheck hemoglobin today. If stable recheck in one month. I will repeat Mini-Mental status exam in one month and if no significant change, I plan to start the patient on Aricept and quickly transitioned to namzeric.  11/10/15 Here today to recheck her hemoglobin. Overall she states she feels well. She looks better.  Unfortunately the family continues to complain of problems with her short-term memory. I repeated Mini-Mental Status exam today. She is able to get 4 out of 5 questions correct on date. She is only able to remember 1 out of 3 objects on recall. She is able to spell world backward today with assistance. She is still unable to perform serial sevens. Past Medical History:  Diagnosis Date  . CKD (chronic kidney disease) stage 3, GFR 30-59 ml/min   . Hypercholesterolemia   . Hypertension   . Mild acid reflux   . Mitral valve prolapse   . Renal insufficiency   . Spinal stenosis   . Staph infection     history of a wound infection following a previous right hip surgery  . Upper GI bleed    duodenal ulcer (09/2015) s/p endo clipping   Past Surgical History:  Procedure Laterality Date  . ABCESS DRAINAGE    . ABDOMINAL HYSTERECTOMY    . COLONOSCOPY  04/30/01   PPJ:[id number] hemorrhoids, otherwise, normal rectum/colon/Normal terminal ileum  . COLONOSCOPY  03/02/2007   WPY:KDXI sigmoid colon diverticula.  Otherwise no polyps, masses/Normal terminal ileum.  Approximately 10-15 cm of the distal terminal ileum visualized/normal view of the rectum  . ESOPHAGOGASTRODUODENOSCOPY    03/01/2007   PJA:SNKNLZ esophagus without evidence of Barrett's, mass/Mild erythema in the antrum.  Distorted pylorus consistent with prior ulcer disease.  Biopsies obtained/ 3. Junction of D1 and D2 erythematous, edematous, and strictured.  The lumen was narrowed to approximately 10 mm.   . ESOPHAGOGASTRODUODENOSCOPY N/A 09/09/2015   Procedure: ESOPHAGOGASTRODUODENOSCOPY (EGD);  Surgeon: Jerene Bears, MD;  Location: [facility] ENDOSCOPY;  Service: Endoscopy;  Laterality: N/A;  . Left total hip replacement arthroplasty  June of 2001  . Right breast lumpectomy    . Right total hip replacement arthroplasty  August of 2001  . TOTAL KNEE ARTHROPLASTY     Current Outpatient Prescriptions on File Prior to Visit  Medication Sig Dispense  Refill  . albuterol (PROVENTIL HFA;VENTOLIN HFA) 108 (90 BASE) MCG/ACT inhaler Inhale 2 puffs into the lungs every 4 (four) hours as needed for wheezing or shortness of breath. 1 Inhaler 0  . albuterol (PROVENTIL) (2.5 MG/3ML) 0.083% nebulizer solution Take 3 mLs (2.5 mg total) by nebulization every 6 (six) hours as needed for wheezing or shortness of breath. 150 mL 0  . amLODipine (NORVASC) 10 MG tablet Take 1 tablet (10 mg total) by mouth daily. 90 tablet 1  . atorvastatin (LIPITOR) 20 MG tablet Take 1 tablet (20 mg total) by mouth daily. 90 tablet 3  . Biotin 2500 MCG CAPS Take 2,500 mg by mouth daily.    . butalbital-aspirin-caffeine (FIORINAL) 50-325-40 MG per capsule TAKE ONE CAPSULE BY MOUTH EVERY 8 HOURS AS  NEEDED FOR HEADACHE 30 capsule 0  . cetirizine (ZYRTEC) 10 MG tablet Take 10 mg by mouth daily.    . citalopram (CELEXA) 20 MG tablet TAKE 1 TABLET DAILY 90 tablet 3  . clotrimazole-betamethasone (LOTRISONE) cream Apply 1 application topically 2 (two) times daily. 30 g 0  . ferrous sulfate 325 (65 FE) MG tablet Take 325 mg by mouth 2 (two) times daily with a meal.    . gabapentin (NEURONTIN) 300 MG capsule Take 1 capsule (300 mg total) by mouth at bedtime.    Marland Kitchen HYDROcodone-acetaminophen (NORCO/VICODIN) 5-325 MG tablet Take 0.5-1 tablets by mouth every 6 (six) hours as needed for moderate pain. 60 tablet 0  . losartan-hydrochlorothiazide (HYZAAR) 50-12.5 MG tablet Take 1 tablet by mouth daily. 30 tablet 11  . metoprolol tartrate (LOPRESSOR) 25 MG tablet TAKE 1 TABLET BY MOUTH TWICE A DAY 60 tablet 1  . montelukast (SINGULAIR) 10 MG tablet Take 1 tablet (10 mg total) by mouth at bedtime. 30 tablet 3  . Multiple Vitamin (MULTIVITAMIN WITH MINERALS) TABS tablet Take 1 tablet by mouth daily.    . pantoprazole (PROTONIX) 40 MG tablet Take 1 tablet (40 mg total) by mouth 2 (two) times daily. For 4weeks then daily 60 tablet 1  . vitamin B-12 (CYANOCOBALAMIN) 500 MCG tablet Take 500 mcg by mouth  daily.     No current facility-administered medications on file prior to visit.    Allergies  Allergen Reactions  . Demerol [Meperidine] Other (See Comments)    Altered mental status  . Meperidine Hcl Other (See Comments)    Altered mental status   Social History   Social History  . Marital status: Widowed    Spouse name: N/A  . Number of children: N/A  . Years of education: N/A   Occupational History  . Not on file.   Social History Main Topics  . Smoking status: Never Smoker  . Smokeless tobacco: Never Used  . Alcohol use No  . Drug use: No  . Sexual activity: No     Comment: widowed   Other Topics Concern  . Not on file   Social History Narrative  . No narrative on file     Review of Systems  All other systems reviewed and are negative.      Objective:   Physical Exam  Constitutional: She appears well-developed and well-nourished.  Neck: No JVD present.  Cardiovascular: Normal rate, regular rhythm and normal heart sounds.   No murmur heard. Pulmonary/Chest: Effort normal and breath sounds normal. No respiratory distress. She has no wheezes. She has no rales.  Abdominal: Soft. Bowel sounds are normal. She exhibits no distension. There is no tenderness. There is no rebound and no guarding.  Musculoskeletal: She exhibits no edema.  Lymphadenopathy:    She has no cervical adenopathy.  Vitals reviewed.         Assessment & Plan:  Vascular dementia, without behavioral disturbance - Plan: donepezil (ARICEPT) 10 MG tablet  Acute post-hemorrhagic anemia - Plan: CBC with Differential/Platelet  Although the patient is doing better. I believe the patient has vascular dementia. MRI obtained of the brain in June 2017 revealed chronic white matter changes consistent with vascular dementia. Begin Aricept 5 mg a day and in 3 weeks increase to 10 mg a day. Recheck in 6 weeks at that point switch the patient to Namzeric if tolerable. Repeat CBC today to ensure  improvement in her anemia

## 2015-11-15 ENCOUNTER — Other Ambulatory Visit: Payer: Self-pay | Admitting: Family Medicine

## 2015-11-15 ENCOUNTER — Ambulatory Visit
Admission: RE | Admit: 2015-11-15 | Discharge: 2015-11-15 | Disposition: A | Discharge status: Home / Self Care | Payer: Medicare Other | Source: Ambulatory Visit | Attending: Family Medicine | Admitting: Family Medicine

## 2015-11-15 DIAGNOSIS — K4090 Unilateral inguinal hernia, without obstruction or gangrene, not specified as recurrent: Secondary | ICD-10-CM | POA: Diagnosis not present

## 2015-11-15 DIAGNOSIS — R1904 Left lower quadrant abdominal swelling, mass and lump: Secondary | ICD-10-CM

## 2015-11-15 MED ORDER — IOPAMIDOL (ISOVUE-300) INJECTION 61%
75.0000 mL | Freq: Once | INTRAVENOUS | Status: AC | PRN
  Administered 2015-11-15: 75 mL via INTRAVENOUS

## 2015-12-22 ENCOUNTER — Encounter: Payer: Self-pay | Admitting: Family Medicine

## 2015-12-22 ENCOUNTER — Ambulatory Visit (INDEPENDENT_AMBULATORY_CARE_PROVIDER_SITE_OTHER): Payer: Medicare Other | Admitting: Family Medicine

## 2015-12-22 VITALS — BP 130/80 | HR 80 | Temp 97.4°F | Resp 18 | Wt 154.0 lb

## 2015-12-22 DIAGNOSIS — L659 Nonscarring hair loss, unspecified: Secondary | ICD-10-CM

## 2015-12-22 DIAGNOSIS — L723 Sebaceous cyst: Secondary | ICD-10-CM | POA: Diagnosis not present

## 2015-12-22 DIAGNOSIS — L089 Local infection of the skin and subcutaneous tissue, unspecified: Secondary | ICD-10-CM | POA: Diagnosis not present

## 2015-12-22 LAB — CBC WITH DIFFERENTIAL/PLATELET
BASOS PCT: 0 %
Basophils Absolute: 0 cells/uL (ref 0–200)
EOS ABS: 660 {cells}/uL — AB (ref 15–500)
Eosinophils Relative: 6 %
HEMATOCRIT: 42.7 % (ref 35.0–45.0)
HEMOGLOBIN: 13.9 g/dL (ref 12.0–15.0)
LYMPHS PCT: 14 %
Lymphs Abs: 1540 cells/uL (ref 850–3900)
MCH: 26.1 pg — ABNORMAL LOW (ref 27.0–33.0)
MCHC: 32.6 g/dL (ref 32.0–36.0)
MCV: 80.3 fL (ref 80.0–100.0)
MONO ABS: 770 {cells}/uL (ref 200–950)
MPV: 9.3 fL (ref 7.5–12.5)
Monocytes Relative: 7 %
NEUTROS PCT: 73 %
Neutro Abs: 8030 cells/uL — ABNORMAL HIGH (ref 1500–7800)
Platelets: 445 10*3/uL — ABNORMAL HIGH (ref 140–400)
RBC: 5.32 MIL/uL — ABNORMAL HIGH (ref 3.80–5.10)
RDW: 16.4 % — AB (ref 11.0–15.0)
WBC: 11 10*3/uL — ABNORMAL HIGH (ref 3.8–10.8)

## 2015-12-22 LAB — TSH: TSH: 1.84 m[IU]/L

## 2015-12-22 MED ORDER — SULFAMETHOXAZOLE-TRIMETHOPRIM 800-160 MG PO TABS: 1.0000 | ORAL_TABLET | Freq: Two times a day (BID) | ORAL | 0 refills | Status: DC

## 2015-12-22 NOTE — Progress Notes (Signed)
Subjective:    Patient ID: [name], female    DOB: 08/03/1932, 82 y.o.   MRN: [id number]  HPI  Recently admitted to the hospital for shortness of breath secondary to asthma exacerbation complicated by aspiration pneumonia. Also developed an upper GI bleed secondary to duodenal ulcers while in the hospital. I have copied relevant portions of the discharge summary and included them below for my reference:  Admit date: 09/03/2015 Discharge date: 09/16/2015  Time spent:45 minutes  Recommendations for Outpatient Follow-up:  1. PCP in 1 week   Discharge Diagnoses:   Asthma exacerbation  Upper Gi bleed  Duodenal ulcer  Sepsis  Aspiration pneumonia  CKD (chronic kidney disease) stage 3, GFR 30-59 ml/min  Hypoxemia  Hypokalemia  Asthma exacerbation  Melena  Acute blood loss anemia  Duodenal ulcer hemorrhage  Leukocytosis   Discharge Condition: stable  Diet recommendation: low sodium  Filed Weights   09/09/15 1619 09/10/15 0427 09/14/15 1606  Weight: 73.2 kg (161 lb 6 oz) 74 kg (163 lb 2.3 oz) 69.4 kg (153 lb)    History of present illness:  82 y.o. female with past medical history significant for hypertension, spinal stenosis, dyslipidemia, asthma. Patient presented to [facility] with worsening shortness of breath over past 3 days prior to this admission. She used at home inhalers with no significant symptomatic relief. She initially presented to urgent care and found to be hypoxic around 80s and she was referred to emergency department for further evaluation  Hospital Course:   Acute asthma exacerbation -improved, resolved  Acute blood loss anemia / Acute upper GI bleed secondary to duodenal bulb hemorrhage / Multiple duodenal ulcers one with visible vessel - developed blood and tarry stools after admission overnight on 6/21 -Gi was consulted, she had an EGD on 6/23 - EGD 09/09/2015 - LA Grade D reflux esophagitis. Multiple  non-obstructing duodenal ulcers, 1 with a visible vessel. H/Pylori negative. - s/p injection and Endo Clipping of ulcer with visible vessel, was on IV PPI, now changed to PO - s/p 2 units PRBC transfusion, no further bleeding now - CT abdomen pelvis without findings of intraabdominal bleed - GI signed off on 09/12/2015  Sepsis, unspecified organism / Aspiration pneumonia - Hospitalization was also complicated by Sepsis/aspiration pnuemonia - leukocytosis initially thought to be secondary to steroids.  - Then sepsis criteria met on 6/22. WBC count up to 43.6. RR was 14-20 and HR 74-105 - CXR with RLL pneumonia and she was started on zosyn 6/23. She was also given PO vanco 6/23 for possible C.diff colitis but stopped 09/10/2015 because C. difficile PCR negative - Blood cultures so far show no growth - Leukocytosis is improving,afebrile now - Day 6 of Zosyn, and 2days of augmentin, improving, Abx stopped - Per SLP study - thin liquids, meds with pure.  -leukocytosis continues to improve from 43K at peak to 19K now  Ileus -resolved -tolerating Po and Having loose stools  Diarrhea -due to Abx, resolving ileus -Cdiff PCR negative -improving  Acute respiratory failure with hypoxia /Acute Asthma exacerbation  -improved, had Asthma exacerbation and Asp PNA  Acute metabolic encephalopathy - due to Sepsis/Aspiration pneumonia - MRI with no acute intracranial findings - improved back to baseline  Accelerated hypertension / Essential hypertension - Continue Norvasc and metoprolol   Chronic kidney disease stage III - Baseline creatinine 1.4 about 10 months ago - Creatinine on this admission 1.09 and up to 1.4, still within baseline range   Hypokalemia - Likely secondary  to nebulizer treatments - Potassium subsequently within normal limits  Depression - held celexa 6/23 due to lethargy - resumed now  Dyslipidemia - Continue Lipitor 20 mg daily   Steroid induced  hyperglycemia - A1c is 5.6 - Continue sliding scale insulin - CBGs in past 24 hours:114, 125, 124  Recent falls - PT evaluation - HHPT recommended   Consultants:   Physical therapy   Pulmonary - respiratory team   Gastroenterology  Procedures:   EGD by Dr. Hilarie Fredrickson 09/09/2015 - LA Grade D reflux esophagitis. No gross lesions in the stomach. Blood in the duodenal bulb and in the second portion of the duodenum. Multiple non-obstructing duodenal ulcers, 1 with a visible vessel. Injected.   Protonix infusion 6/23 -->  1 U PRBC 09/10/2015   1 U PRBC 09/12/2015    09/27/15 Since discharge from the hospital, her daughter is very concerned about memory loss. Patient is becoming very forgetful. She is forgetting the names of family members including spouse is a sons and daughters. She is also becoming confused particularly at night. She was sundowning in the hospital. She is not taking any iron. She is taking a proton pump inhibitor twice a day. Her breathing has improved. She denies any fevers or chills or coughing. She denies any black tarry stools or blood in her stool. She denies any fevers or chills. However she has lost significant weight since last time I saw her. She is still not eating well. She has poor appetite.  At that time, my plan was: Recheck a CBC to monitor her anemia. Recommended iron sulfate 325 mg by mouth twice a day. Recommended in short 2 cans per day to increase her protein consumption and correct her protein calorie malnutrition. Her blood pressures elevated today but I hesitate to make any changes given how weak she appears. Recheck blood pressure in 2 weeks. In 2 weeks I would also perform a Mini-Mental status exam. Hopefully that time the patient is closer to her baseline and we will get a more accurate assessment of her underlying mental status. She very well could be developing some mild dementia. Recommended avoidance of all NSAIDs area she can use Tylenol and/or  hydrocodone for pain.  09/30/15 Hemoglobin returned low at 8.7. My partner discussed the case with Dr. Hilarie Fredrickson with GI recommended ER evaluation and possible admission for repeat EGD as he was concerned that she may be rebleeding from her duodenal ulcers. At the emergency room, he hemoglobin was found to be 8.6.It was felt that she was stable to go home and follow-up with me after the emergency room physician discussed the case with the GI physician given the fact that hemoglobin had been stable for 24 hours. She is here today to recheck a CBC. Overall she is doing well. She denies any chest pain shortness of breath and dyspnea on exertion. She does complain of fatigue. She is not tachycardic. Denies any black tarry stools. Stat CBC today at the office shows a hemoglobin of 8.7 which is stable  10/03/15 Hemoglobin is stable at last office visit and 8.7. She has increased her iron to 325 mg by mouth twice a day. She denies any bleeding or bruising although she does have black stools due to iron. Her weight is up 1 pound in her blood pressure stable. I did perform Mini-Mental Status exam today. She is able to get 4 out of 5 questions correct on the date. She was able to remember 2 out of 3 objects on  recall. She was unable to perform serial sevens or spell world backward. On mini cog assessment/clock drawing. The patient was able to draw a clock but only able to get 6 numbers. All of them were lumped into the upper right quadrant of the clock. She was unable to draw the hands on the clock to state the appropriate time. All this is consistent with moderate dementia.  At that time, my plan was: Recheck hemoglobin today. If stable recheck in one month. I will repeat Mini-Mental status exam in one month and if no significant change, I plan to start the patient on Aricept and quickly transitioned to namzeric.  11/10/15 Here today to recheck her hemoglobin. Overall she states she feels well. She looks better.  Unfortunately the family continues to complain of problems with her short-term memory. I repeated Mini-Mental Status exam today. She is able to get 4 out of 5 questions correct on date. She is only able to remember 1 out of 3 objects on recall. She is able to spell world backward today with assistance. She is still unable to perform serial sevens.  At that time, my plan was: Although the patient is doing better. I believe the patient has vascular dementia. MRI obtained of the brain in June 2017 revealed chronic white matter changes consistent with vascular dementia. Begin Aricept 5 mg a day and in 3 weeks increase to 10 mg a day. Recheck in 6 weeks at that point switch the patient to Namzeric if tolerable. Repeat CBC today to ensure improvement in her anemia  12/22/15 The patient is tolerating Aricept without difficulty. She is here today to discuss adding Namenda. However she has a painful red hot abscess in her left axilla. Upon further inspection there is a 2.5 cm fluctuant lump in her left axilla. It is spontaneously draining yellow cystlike contents. I am able to grasp the cyst sac with a pair of forceps through a spontaneous opening and removed a large portion of the cyst sac. The surrounding skin is erythematous warm and painful. It appears to be an inflamed/infected sebaceous cyst. She is also concerned because she is losing hair rapidly. I believe a lot of this stems from the tremendous shock she has been through in the last 3-4 months. But she also is losing hair in an androgenic pattern in the center of her scalp and in the front. Past Medical History:  Diagnosis Date  . CKD (chronic kidney disease) stage 3, GFR 30-59 ml/min   . Hypercholesterolemia   . Hypertension   . Mild acid reflux   . Mitral valve prolapse   . Renal insufficiency   . Spinal stenosis   . Staph infection     history of a wound infection following a previous right hip surgery  . Upper GI bleed    duodenal ulcer (09/2015)  s/p endo clipping   Past Surgical History:  Procedure Laterality Date  . ABCESS DRAINAGE    . ABDOMINAL HYSTERECTOMY    . COLONOSCOPY  04/30/01   OIN:OMVEHMCN hemorrhoids, otherwise, normal rectum/colon/Normal terminal ileum  . COLONOSCOPY  03/02/2007   OBS:JGGE sigmoid colon diverticula.  Otherwise no polyps, masses/Normal terminal ileum.  Approximately 10-15 cm of the distal terminal ileum visualized/normal view of the rectum  . ESOPHAGOGASTRODUODENOSCOPY    03/01/2007   ZMO:QHUTML esophagus without evidence of Barrett's, mass/Mild erythema in the antrum.  Distorted pylorus consistent with prior ulcer disease.  Biopsies obtained/ 3. Junction of D1 and D2 erythematous, edematous, and strictured.  The  lumen was narrowed to approximately 10 mm.   . ESOPHAGOGASTRODUODENOSCOPY N/A 09/09/2015   Procedure: ESOPHAGOGASTRODUODENOSCOPY (EGD);  Surgeon: Jerene Bears, MD;  Location: [facility] ENDOSCOPY;  Service: Endoscopy;  Laterality: N/A;  . Left total hip replacement arthroplasty  June of 2001  . Right breast lumpectomy    . Right total hip replacement arthroplasty  August of 2001  . TOTAL KNEE ARTHROPLASTY     Current Outpatient Prescriptions on File Prior to Visit  Medication Sig Dispense Refill  . albuterol (PROVENTIL HFA;VENTOLIN HFA) 108 (90 BASE) MCG/ACT inhaler Inhale 2 puffs into the lungs every 4 (four) hours as needed for wheezing or shortness of breath. 1 Inhaler 0  . albuterol (PROVENTIL) (2.5 MG/3ML) 0.083% nebulizer solution Take 3 mLs (2.5 mg total) by nebulization every 6 (six) hours as needed for wheezing or shortness of breath. 150 mL 0  . amLODipine (NORVASC) 10 MG tablet TAKE 1 TABLET BY MOUTH DAILY 90 tablet 0  . atorvastatin (LIPITOR) 20 MG tablet Take 1 tablet (20 mg total) by mouth daily. 90 tablet 3  . Biotin 2500 MCG CAPS Take 2,500 mg by mouth daily.    . butalbital-aspirin-caffeine (FIORINAL) 50-325-40 MG per capsule TAKE ONE CAPSULE BY MOUTH EVERY 8 HOURS AS NEEDED FOR  HEADACHE 30 capsule 0  . cetirizine (ZYRTEC) 10 MG tablet Take 10 mg by mouth daily.    . citalopram (CELEXA) 20 MG tablet TAKE 1 TABLET DAILY 90 tablet 3  . clotrimazole-betamethasone (LOTRISONE) cream Apply 1 application topically 2 (two) times daily. 30 g 0  . donepezil (ARICEPT) 10 MG tablet Take 1 tablet (10 mg total) by mouth at bedtime. 30 tablet 5  . ferrous sulfate 325 (65 FE) MG tablet Take 325 mg by mouth 2 (two) times daily with a meal.    . gabapentin (NEURONTIN) 300 MG capsule Take 1 capsule (300 mg total) by mouth at bedtime.    Marland Kitchen HYDROcodone-acetaminophen (NORCO/VICODIN) 5-325 MG tablet Take 0.5-1 tablets by mouth every 6 (six) hours as needed for moderate pain. 60 tablet 0  . losartan-hydrochlorothiazide (HYZAAR) 50-12.5 MG tablet Take 1 tablet by mouth daily. 30 tablet 11  . metoprolol tartrate (LOPRESSOR) 25 MG tablet TAKE 1 TABLET BY MOUTH TWICE A DAY 60 tablet 1  . montelukast (SINGULAIR) 10 MG tablet Take 1 tablet (10 mg total) by mouth at bedtime. 30 tablet 3  . Multiple Vitamin (MULTIVITAMIN WITH MINERALS) TABS tablet Take 1 tablet by mouth daily.    . pantoprazole (PROTONIX) 40 MG tablet Take 1 tablet (40 mg total) by mouth 2 (two) times daily. For 4weeks then daily 60 tablet 1  . vitamin B-12 (CYANOCOBALAMIN) 500 MCG tablet Take 500 mcg by mouth daily.     No current facility-administered medications on file prior to visit.    Allergies  Allergen Reactions  . Demerol [Meperidine] Other (See Comments)    Altered mental status  . Meperidine Hcl Other (See Comments)    Altered mental status   Social History   Social History  . Marital status: Widowed    Spouse name: N/A  . Number of children: N/A  . Years of education: N/A   Occupational History  . Not on file.   Social History Main Topics  . Smoking status: Never Smoker  . Smokeless tobacco: Never Used  . Alcohol use No  . Drug use: No  . Sexual activity: No     Comment: widowed   Other Topics  Concern  . Not on  file   Social History Narrative  . No narrative on file     Review of Systems  All other systems reviewed and are negative.      Objective:   Physical Exam  Constitutional: She appears well-developed and well-nourished.  Neck: No JVD present.  Cardiovascular: Normal rate, regular rhythm and normal heart sounds.   No murmur heard. Pulmonary/Chest: Effort normal and breath sounds normal. No respiratory distress. She has no wheezes. She has no rales.  Abdominal: Soft. Bowel sounds are normal. She exhibits no distension. There is no tenderness. There is no rebound and no guarding.  Musculoskeletal: She exhibits no edema.  Lymphadenopathy:    She has no cervical adenopathy.  Skin: Skin is warm. There is erythema.  Vitals reviewed.         Assessment & Plan:  Infected sebaceous cyst - Plan: sulfamethoxazole-trimethoprim (BACTRIM DS,SEPTRA DS) 800-160 MG tablet  Alopecia - Plan: CBC with Differential/Platelet, COMPLETE METABOLIC PANEL WITH GFR, TSH  Patient appean infected inflamed sebs cyst. It . I&D is not necessary but I will start her on Bactrim double strength tablets 1 by mouday for 7 days. I believer alopecia is a combination of mild telogen effluvium after her life threatening illness this summer coupled with androgenic alopecia. However I will check a CBC, CMP, and TSH. Otherwise I tried to reassure the patient.  I will also have her discontinue Aricept and begin Namzeric ttitration pack ultimately to a goal of 28/10 by mouth daily

## 2015-12-23 LAB — COMPLETE METABOLIC PANEL WITH GFR
ALBUMIN: 4.1 g/dL (ref 3.6–5.1)
ALK PHOS: 137 U/L — AB (ref 33–130)
ALT: 21 U/L (ref 6–29)
AST: 24 U/L (ref 10–35)
BUN: 31 mg/dL — ABNORMAL HIGH (ref 7–25)
CALCIUM: 9.7 mg/dL (ref 8.6–10.4)
CHLORIDE: 101 mmol/L (ref 98–110)
CO2: 23 mmol/L (ref 20–31)
CREATININE: 1.39 mg/dL — AB (ref 0.60–0.88)
GFR, EST AFRICAN AMERICAN: 41 mL/min — AB (ref >=60)
GFR, Est Non African American: 36 mL/min — ABNORMAL LOW (ref >=60)
Glucose, Bld: 100 mg/dL — ABNORMAL HIGH (ref 70–99)
Potassium: 4.3 mmol/L (ref 3.5–5.3)
Sodium: 139 mmol/L (ref 135–146)
Total Bilirubin: 0.3 mg/dL (ref 0.2–1.2)
Total Protein: 7.1 g/dL (ref 6.1–8.1)

## 2015-12-27 ENCOUNTER — Other Ambulatory Visit: Payer: Self-pay | Admitting: Family Medicine

## 2015-12-28 ENCOUNTER — Other Ambulatory Visit: Payer: Self-pay | Admitting: Family Medicine

## 2016-01-23 ENCOUNTER — Telehealth: Payer: Self-pay | Admitting: Family Medicine

## 2016-01-23 MED ORDER — MEMANTINE HCL 10 MG PO TABS: 10.0000 mg | ORAL_TABLET | Freq: Two times a day (BID) | ORAL | 3 refills | Status: DC

## 2016-01-23 MED ORDER — GABAPENTIN 300 MG PO CAPS: 300.0000 mg | ORAL_CAPSULE | Freq: Every day | ORAL | 5 refills | Status: DC

## 2016-01-23 MED ORDER — DONEPEZIL HCL 10 MG PO TABS: 10.0000 mg | ORAL_TABLET | Freq: Every day | ORAL | 3 refills | Status: DC

## 2016-01-23 NOTE — Telephone Encounter (Signed)
I am fine with refill on gabapentin and breaking up namzeric into aricept 10 qday and namenda 10 bid.  I would only use meclizine if vertigo is severe because it can cause worsening confusion.

## 2016-01-23 NOTE — Telephone Encounter (Signed)
Spoke to [employer] and she does not want to use the Meclizine to cause more confusion. She would like rx sent to the 2 meds.   Medication called/sent to requested pharmacy.

## 2016-01-23 NOTE — Telephone Encounter (Signed)
[name] called and needs refill on Gabapentin - sent to pharm. Would like to break up namzeric as it would be cheaper for both generic medications separate? Also has been having some Vertigo and Dramamine not helping, can we call her in something for that as well?

## 2016-01-25 ENCOUNTER — Telehealth: Payer: Self-pay | Admitting: Family Medicine

## 2016-01-25 MED ORDER — GABAPENTIN 300 MG PO CAPS: 300.0000 mg | ORAL_CAPSULE | Freq: Every day | ORAL | 5 refills | Status: DC

## 2016-01-25 MED ORDER — DICLOFENAC SODIUM 1 % TD GEL: 2.0000 g | Freq: Four times a day (QID) | TRANSDERMAL | 3 refills | Status: DC

## 2016-01-25 NOTE — Telephone Encounter (Signed)
Needed refill on Gabapentin - it was marked as no print - did not realize this will resend and sent in rx for voltaren gel

## 2016-01-26 ENCOUNTER — Telehealth: Payer: Self-pay | Admitting: *Deleted

## 2016-01-26 NOTE — Telephone Encounter (Signed)
Received request from pharmacy for PA on Voltaren Gel.   PA submitted.   Dx: M17.9- OA

## 2016-01-27 MED ORDER — GABAPENTIN 300 MG PO CAPS
300.0000 mg | ORAL_CAPSULE | Freq: Three times a day (TID) | ORAL | 5 refills | Status: DC
Start: 2016-01-27 — End: 2017-09-04

## 2016-01-27 NOTE — Telephone Encounter (Signed)
Pt called and gabapentin should have been tid - called pharm and fixed that rx to tid. Pt aware.

## 2016-01-27 NOTE — Addendum Note (Signed)
Addended by: Shary Decamp B on: 01/27/2016 04:15 PM   Modules accepted: Orders

## 2016-01-31 ENCOUNTER — Other Ambulatory Visit: Payer: Self-pay

## 2016-01-31 DIAGNOSIS — R945 Abnormal results of liver function studies: Secondary | ICD-10-CM

## 2016-01-31 DIAGNOSIS — R7989 Other specified abnormal findings of blood chemistry: Secondary | ICD-10-CM

## 2016-02-02 ENCOUNTER — Telehealth: Payer: Self-pay | Admitting: Family Medicine

## 2016-02-02 MED ORDER — HYDROCODONE-ACETAMINOPHEN 5-325 MG PO TABS: 0.5000 | ORAL_TABLET | Freq: Four times a day (QID) | ORAL | 0 refills | Status: DC | PRN

## 2016-02-02 NOTE — Telephone Encounter (Signed)
Approved. # [id number] + 0. 

## 2016-02-02 NOTE — Telephone Encounter (Signed)
RX printed, left up front and patient aware to pick up in am via vm 

## 2016-02-02 NOTE — Telephone Encounter (Signed)
Requesting a refill on her Hydrocodone - Ok to refill??      Last rf 09/27/15 LOV 12/22/15

## 2016-02-20 ENCOUNTER — Other Ambulatory Visit: Payer: Self-pay | Admitting: Family Medicine

## 2016-02-22 NOTE — Telephone Encounter (Signed)
Received PA determination.   PA approved.   pharmacy made aware.

## 2016-03-23 ENCOUNTER — Encounter: Payer: Self-pay | Admitting: Family Medicine

## 2016-03-23 ENCOUNTER — Ambulatory Visit (INDEPENDENT_AMBULATORY_CARE_PROVIDER_SITE_OTHER): Payer: Medicare Other | Admitting: Family Medicine

## 2016-03-23 VITALS — BP 138/84 | HR 78 | Temp 99.1°F | Resp 18 | Wt 162.0 lb

## 2016-03-23 DIAGNOSIS — R05 Cough: Secondary | ICD-10-CM | POA: Diagnosis not present

## 2016-03-23 DIAGNOSIS — R059 Cough, unspecified: Secondary | ICD-10-CM

## 2016-03-23 MED ORDER — BENZONATATE 100 MG PO CAPS: 200.0000 mg | ORAL_CAPSULE | Freq: Three times a day (TID) | ORAL | 0 refills | Status: DC | PRN

## 2016-03-23 NOTE — Progress Notes (Signed)
Subjective:    Patient ID: [name], female    DOB: 1932-12-14, 83 y.o.   MRN: [id number]  HPI Patient reports a four-day history of cough. Cough is nonproductive. She denies any chest pain or shortness of breath. She denies any fever. She denies any pleurisy. She has had some head congestion some mild rhinorrhea and some mild scratchy throat. She denies any severe symptoms of illness. Cough is more of an irritant. It is worse at night when she lies down. Past Medical History:  Diagnosis Date  . CKD (chronic kidney disease) stage 3, GFR 30-59 ml/min   . Hypercholesterolemia   . Hypertension   . Mild acid reflux   . Mitral valve prolapse   . Renal insufficiency   . Spinal stenosis   . Staph infection     history of a wound infection following a previous right hip surgery  . Upper GI bleed    duodenal ulcer (09/2015) s/p endo clipping   Past Surgical History:  Procedure Laterality Date  . ABCESS DRAINAGE    . ABDOMINAL HYSTERECTOMY    . COLONOSCOPY  04/30/01   [id number] hemorrhoids, otherwise, normal rectum/colon/Normal terminal ileum  . COLONOSCOPY  03/02/2007   [id number] sigmoid colon diverticula.  Otherwise no polyps, masses/Normal terminal ileum.  Approximately 10-15 cm of the distal terminal ileum visualized/normal view of the rectum  . ESOPHAGOGASTRODUODENOSCOPY    03/01/2007   [id number] esophagus without evidence of Barrett's, mass/Mild erythema in the antrum.  Distorted pylorus consistent with prior ulcer disease.  Biopsies obtained/ 3. Junction of D1 and D2 erythematous, edematous, and strictured.  The lumen was narrowed to approximately 10 mm.   . ESOPHAGOGASTRODUODENOSCOPY N/A 09/09/2015   Procedure: ESOPHAGOGASTRODUODENOSCOPY (EGD);  Surgeon: Jerene Bears, MD;  Location: [facility] ENDOSCOPY;  Service: Endoscopy;  Laterality: N/A;  . Left total hip replacement arthroplasty  June of 2001  . Right breast lumpectomy    . Right total hip replacement arthroplasty  August of  2001  . TOTAL KNEE ARTHROPLASTY     Current Outpatient Prescriptions on File Prior to Visit  Medication Sig Dispense Refill  . albuterol (PROVENTIL HFA;VENTOLIN HFA) 108 (90 BASE) MCG/ACT inhaler Inhale 2 puffs into the lungs every 4 (four) hours as needed for wheezing or shortness of breath. 1 Inhaler 0  . albuterol (PROVENTIL) (2.5 MG/3ML) 0.083% nebulizer solution Take 3 mLs (2.5 mg total) by nebulization every 6 (six) hours as needed for wheezing or shortness of breath. 150 mL 0  . amLODipine (NORVASC) 10 MG tablet TAKE 1 TABLET BY MOUTH DAILY 90 tablet 3  . atorvastatin (LIPITOR) 20 MG tablet Take 1 tablet (20 mg total) by mouth daily. 90 tablet 3  . Biotin 2500 MCG CAPS Take 2,500 mg by mouth daily.    . butalbital-aspirin-caffeine (FIORINAL) 50-325-40 MG per capsule TAKE ONE CAPSULE BY MOUTH EVERY 8 HOURS AS NEEDED FOR HEADACHE 30 capsule 0  . cetirizine (ZYRTEC) 10 MG tablet Take 10 mg by mouth daily.    . citalopram (CELEXA) 20 MG tablet TAKE 1 TABLET DAILY 90 tablet 3  . clotrimazole-betamethasone (LOTRISONE) cream Apply 1 application topically 2 (two) times daily. (Patient taking differently: Apply 1 application topically 2 (two) times daily as needed. ) 30 g 0  . diclofenac sodium (VOLTAREN) 1 % GEL Apply 2 g topically 4 (four) times daily. 100 g 3  . donepezil (ARICEPT) 10 MG tablet Take 1 tablet (10 mg total) by mouth at bedtime. 90 tablet 3  .  ferrous sulfate 325 (65 FE) MG tablet Take 325 mg by mouth 2 (two) times daily with a meal.    . gabapentin (NEURONTIN) 300 MG capsule Take 1 capsule (300 mg total) by mouth 3 (three) times daily. 90 capsule 5  . HYDROcodone-acetaminophen (NORCO/VICODIN) 5-325 MG tablet Take 0.5-1 tablets by mouth every 6 (six) hours as needed for moderate pain. 60 tablet 0  . losartan-hydrochlorothiazide (HYZAAR) 50-12.5 MG tablet Take 1 tablet by mouth daily. 30 tablet 11  . memantine (NAMENDA) 10 MG tablet Take 1 tablet (10 mg total) by mouth 2 (two) times  daily. 90 tablet 3  . metoprolol tartrate (LOPRESSOR) 25 MG tablet TAKE 1 TABLET BY MOUTH TWICE A DAY 60 tablet 11  . montelukast (SINGULAIR) 10 MG tablet Take 1 tablet (10 mg total) by mouth at bedtime. 30 tablet 3  . Multiple Vitamin (MULTIVITAMIN WITH MINERALS) TABS tablet Take 1 tablet by mouth daily.    . pantoprazole (PROTONIX) 40 MG tablet Take 1 tablet (40 mg total) by mouth daily. 90 tablet 3  . vitamin B-12 (CYANOCOBALAMIN) 500 MCG tablet Take 500 mcg by mouth daily.     No current facility-administered medications on file prior to visit.    Allergies  Allergen Reactions  . Demerol [Meperidine] Other (See Comments)    Altered mental status  . Meperidine Hcl Other (See Comments)    Altered mental status   Social History   Social History  . Marital status: Widowed    Spouse name: N/A  . Number of children: N/A  . Years of education: N/A   Occupational History  . Not on file.   Social History Main Topics  . Smoking status: Never Smoker  . Smokeless tobacco: Never Used  . Alcohol use No  . Drug use: No  . Sexual activity: No     Comment: widowed   Other Topics Concern  . Not on file   Social History Narrative  . No narrative on file      Review of Systems  All other systems reviewed and are negative.      Objective:   Physical Exam  Constitutional: She appears well-developed and well-nourished. No distress.  HENT:  Right Ear: External ear normal.  Left Ear: External ear normal.  Nose: Nose normal.  Mouth/Throat: Oropharynx is clear and moist. No oropharyngeal exudate.  Eyes: Conjunctivae are normal.  Neck: Neck supple.  Cardiovascular: Normal rate, regular rhythm and normal heart sounds.   Pulmonary/Chest: Effort normal and breath sounds normal. No respiratory distress. She has no wheezes. She has no rales.  Abdominal: Soft. Bowel sounds are normal.  Lymphadenopathy:    She has no cervical adenopathy.  Skin: She is not diaphoretic.  Vitals  reviewed.         Assessment & Plan:  Cough  I believe the patient has an upper respiratory infection similar to a cold. I will treat the patient symptomatically with Tessalon Perles 200 mg every 8 hours as needed. Recheck next week if no better or sooner if worse.

## 2016-03-29 ENCOUNTER — Telehealth: Payer: Self-pay | Admitting: Family Medicine

## 2016-03-29 NOTE — Telephone Encounter (Signed)
I would recommend against this as sleeping medication can increase confusion and she has dementia.

## 2016-03-29 NOTE — Telephone Encounter (Signed)
Joelene Millin called and states that the pt is having trouble sleeping at night and was wanting to know if she could get something call in to help her sleep?

## 2016-03-30 NOTE — Telephone Encounter (Signed)
Pt's daughter aware of recommendations  

## 2016-05-26 ENCOUNTER — Encounter (HOSPITAL_COMMUNITY): Payer: Self-pay | Admitting: Emergency Medicine

## 2016-05-26 ENCOUNTER — Emergency Department (HOSPITAL_COMMUNITY): Payer: Medicare Other

## 2016-05-26 ENCOUNTER — Emergency Department (HOSPITAL_COMMUNITY)
Admission: EM | Admit: 2016-05-26 | Discharge: 2016-05-26 | Disposition: A | Discharge status: Home / Self Care | Payer: Medicare Other | Attending: Emergency Medicine | Admitting: Emergency Medicine

## 2016-05-26 DIAGNOSIS — Y999 Unspecified external cause status: Secondary | ICD-10-CM | POA: Insufficient documentation

## 2016-05-26 DIAGNOSIS — N183 Chronic kidney disease, stage 3 (moderate): Secondary | ICD-10-CM | POA: Insufficient documentation

## 2016-05-26 DIAGNOSIS — W06XXXA Fall from bed, initial encounter: Secondary | ICD-10-CM | POA: Diagnosis not present

## 2016-05-26 DIAGNOSIS — Y939 Activity, unspecified: Secondary | ICD-10-CM | POA: Diagnosis not present

## 2016-05-26 DIAGNOSIS — W19XXXA Unspecified fall, initial encounter: Secondary | ICD-10-CM

## 2016-05-26 DIAGNOSIS — Z79899 Other long term (current) drug therapy: Secondary | ICD-10-CM | POA: Insufficient documentation

## 2016-05-26 DIAGNOSIS — Z96643 Presence of artificial hip joint, bilateral: Secondary | ICD-10-CM | POA: Diagnosis not present

## 2016-05-26 DIAGNOSIS — S0101XA Laceration without foreign body of scalp, initial encounter: Secondary | ICD-10-CM

## 2016-05-26 DIAGNOSIS — R51 Headache: Secondary | ICD-10-CM | POA: Diagnosis not present

## 2016-05-26 DIAGNOSIS — S0990XA Unspecified injury of head, initial encounter: Secondary | ICD-10-CM | POA: Diagnosis present

## 2016-05-26 DIAGNOSIS — Y929 Unspecified place or not applicable: Secondary | ICD-10-CM | POA: Diagnosis not present

## 2016-05-26 DIAGNOSIS — Z96659 Presence of unspecified artificial knee joint: Secondary | ICD-10-CM | POA: Diagnosis not present

## 2016-05-26 DIAGNOSIS — I129 Hypertensive chronic kidney disease with stage 1 through stage 4 chronic kidney disease, or unspecified chronic kidney disease: Secondary | ICD-10-CM | POA: Insufficient documentation

## 2016-05-26 DIAGNOSIS — Z23 Encounter for immunization: Secondary | ICD-10-CM | POA: Diagnosis not present

## 2016-05-26 DIAGNOSIS — J45909 Unspecified asthma, uncomplicated: Secondary | ICD-10-CM | POA: Diagnosis not present

## 2016-05-26 MED ORDER — LIDOCAINE-EPINEPHRINE (PF) 2 %-1:200000 IJ SOLN
10.0000 mL | Freq: Once | INTRAMUSCULAR | Status: AC
  Administered 2016-05-26: 10 mL
  Filled 2016-05-26: qty 20

## 2016-05-26 MED ORDER — TETANUS-DIPHTH-ACELL PERTUSSIS 5-2.5-18.5 LF-MCG/0.5 IM SUSP
0.5000 mL | Freq: Once | INTRAMUSCULAR | Status: AC
  Administered 2016-05-26: 0.5 mL via INTRAMUSCULAR
  Filled 2016-05-26: qty 0.5

## 2016-05-26 NOTE — ED Notes (Signed)
Patient transported to CT 

## 2016-05-26 NOTE — ED Notes (Signed)
ED Provider at bedside. 

## 2016-05-26 NOTE — ED Triage Notes (Signed)
NO LOC

## 2016-05-26 NOTE — ED Provider Notes (Signed)
[facility] DEPT Provider Note   CSN: [id number] Arrival date & time: 05/26/16  0707     History   Chief Complaint Chief Complaint  Patient presents with  . Fall  . Head Injury  . Back Pain    HPI [name] is a 83 y.o. female.  Pt presents to the ED today with a laceration to the back of her head.  She said she became tangled in the covers and fell while trying to get out of bed.  She hit her head on the bedside table.  The pt did not have a loc.  She was able to ambulate after the fall.  She denies other injuries.      Past Medical History:  Diagnosis Date  . CKD (chronic kidney disease) stage 3, GFR 30-59 ml/min   . Hypercholesterolemia   . Hypertension   . Mild acid reflux   . Mitral valve prolapse   . Renal insufficiency   . Spinal stenosis   . Staph infection     history of a wound infection following a previous right hip surgery  . Upper GI bleed    duodenal ulcer (09/2015) s/p endo clipping    Patient Active Problem List   Diagnosis Date Noted  . Upper GI bleed   . Aspiration pneumonia ([place])   . Leukocytosis   . Melena   . Acute blood loss anemia   . Duodenal ulcer hemorrhage   . Hypoxemia 09/03/2015  . Hypokalemia 09/03/2015  . Asthma exacerbation 09/03/2015  . Encounter for screening colonoscopy 09/24/2013  . Elevated LFTs 09/24/2013  . CKD (chronic kidney disease) stage 3, GFR 30-59 ml/min   . Spinal stenosis   . Food impaction of esophagus 08/02/2011  . GERD 09/23/2009  . RENAL INSUFFICIENCY 09/23/2009  . GASTROINTESTINAL HEMORRHAGE, HX OF 08/13/2008    Past Surgical History:  Procedure Laterality Date  . ABCESS DRAINAGE    . ABDOMINAL HYSTERECTOMY    . COLONOSCOPY  04/30/01   UUE:[id number] hemorrhoids, otherwise, normal rectum/colon/Normal terminal ileum  . COLONOSCOPY  03/02/2007   PHX:TAVW sigmoid colon diverticula.  Otherwise no polyps, masses/Normal terminal ileum.  Approximately 10-15 cm of the distal terminal ileum  visualized/normal view of the rectum  . ESOPHAGOGASTRODUODENOSCOPY    03/01/2007   PVX:YIAXKP esophagus without evidence of Barrett's, mass/Mild erythema in the antrum.  Distorted pylorus consistent with prior ulcer disease.  Biopsies obtained/ 3. Junction of D1 and D2 erythematous, edematous, and strictured.  The lumen was narrowed to approximately 10 mm.   . ESOPHAGOGASTRODUODENOSCOPY N/A 09/09/2015   Procedure: ESOPHAGOGASTRODUODENOSCOPY (EGD);  Surgeon: Jerene Bears, MD;  Location: [facility] ENDOSCOPY;  Service: Endoscopy;  Laterality: N/A;  . Left total hip replacement arthroplasty  June of 2001  . Right breast lumpectomy    . Right total hip replacement arthroplasty  August of 2001  . TOTAL KNEE ARTHROPLASTY      OB History    No data available       Home Medications    Prior to Admission medications   Medication Sig Start Date End Date Taking? Authorizing Provider  albuterol (PROVENTIL HFA;VENTOLIN HFA) 108 (90 BASE) MCG/ACT inhaler Inhale 2 puffs into the lungs every 4 (four) hours as needed for wheezing or shortness of breath. 05/25/14   Susy Frizzle, MD  albuterol (PROVENTIL) (2.5 MG/3ML) 0.083% nebulizer solution Take 3 mLs (2.5 mg total) by nebulization every 6 (six) hours as needed for wheezing or shortness of breath. 06/14/14   Cletus Gash  Avel Peace, MD  amLODipine (NORVASC) 10 MG tablet TAKE 1 TABLET BY MOUTH DAILY 02/20/16   Susy Frizzle, MD  atorvastatin (LIPITOR) 20 MG tablet Take 1 tablet (20 mg total) by mouth daily. 09/27/15   Susy Frizzle, MD  benzonatate (TESSALON) 100 MG capsule Take 2 capsules (200 mg total) by mouth 3 (three) times daily as needed for cough. 03/23/16   Susy Frizzle, MD  Biotin 2500 MCG CAPS Take 2,500 mg by mouth daily.    Historical Provider, MD  butalbital-aspirin-caffeine [facility]) 50-325-40 MG per capsule TAKE ONE CAPSULE BY MOUTH EVERY 8 HOURS AS NEEDED FOR HEADACHE 04/15/14   Susy Frizzle, MD  cetirizine (ZYRTEC) 10 MG tablet Take 10 mg by  mouth daily.    Historical Provider, MD  citalopram (CELEXA) 20 MG tablet TAKE 1 TABLET DAILY 11/04/14   Susy Frizzle, MD  clotrimazole-betamethasone (LOTRISONE) cream Apply 1 application topically 2 (two) times daily. Patient taking differently: Apply 1 application topically 2 (two) times daily as needed.  07/02/13   Susy Frizzle, MD  diclofenac sodium (VOLTAREN) 1 % GEL Apply 2 g topically 4 (four) times daily. 01/25/16   Susy Frizzle, MD  donepezil (ARICEPT) 10 MG tablet Take 1 tablet (10 mg total) by mouth at bedtime. 01/23/16   Susy Frizzle, MD  ferrous sulfate 325 (65 FE) MG tablet Take 325 mg by mouth 2 (two) times daily with a meal.    Historical Provider, MD  gabapentin (NEURONTIN) 300 MG capsule Take 1 capsule (300 mg total) by mouth 3 (three) times daily. 01/27/16   Susy Frizzle, MD  HYDROcodone-acetaminophen (NORCO/VICODIN) 5-325 MG tablet Take 0.5-1 tablets by mouth every 6 (six) hours as needed for moderate pain. 02/02/16   Lonie Peak Dixon, PA-C  losartan-hydrochlorothiazide (HYZAAR) 50-12.5 MG tablet Take 1 tablet by mouth daily. 01/10/15   Susy Frizzle, MD  memantine (NAMENDA) 10 MG tablet Take 1 tablet (10 mg total) by mouth 2 (two) times daily. 01/23/16   Susy Frizzle, MD  metoprolol tartrate (LOPRESSOR) 25 MG tablet TAKE 1 TABLET BY MOUTH TWICE A DAY 12/27/15   Susy Frizzle, MD  montelukast (SINGULAIR) 10 MG tablet Take 1 tablet (10 mg total) by mouth at bedtime. 11/01/14   Susy Frizzle, MD  Multiple Vitamin (MULTIVITAMIN WITH MINERALS) TABS tablet Take 1 tablet by mouth daily.    Historical Provider, MD  pantoprazole (PROTONIX) 40 MG tablet Take 1 tablet (40 mg total) by mouth daily. 12/28/15   Susy Frizzle, MD  vitamin B-12 (CYANOCOBALAMIN) 500 MCG tablet Take 500 mcg by mouth daily.    Historical Provider, MD    Family History Family History  Problem Relation Age of Onset  . Colon cancer Neg Hx     Social History Social History  Substance  Use Topics  . Smoking status: Never Smoker  . Smokeless tobacco: Never Used  . Alcohol use No     Allergies   Demerol [meperidine] and Meperidine hcl   Review of Systems Review of Systems  Skin: Positive for wound.  Neurological: Positive for headaches.  All other systems reviewed and are negative.    Physical Exam Updated Vital Signs BP 115/71 (BP Location: Right Arm)   Pulse 79   Temp 97.3 F (36.3 C) (Oral)   Resp 20   SpO2 97%   Physical Exam  Constitutional: She is oriented to person, place, and time. She appears well-developed and well-nourished.  HENT:  Head: Normocephalic.    Right Ear: External ear normal.  Left Ear: External ear normal.  Nose: Nose normal.  Mouth/Throat: Oropharynx is clear and moist.  Eyes: Conjunctivae and EOM are normal. Pupils are equal, round, and reactive to light.  Neck: Normal range of motion. Neck supple.  Cardiovascular: Normal rate, regular rhythm, normal heart sounds and intact distal pulses.   Pulmonary/Chest: Effort normal and breath sounds normal.  Abdominal: Soft. Bowel sounds are normal.  Musculoskeletal: Normal range of motion.  Neurological: She is alert and oriented to person, place, and time.  Skin: Skin is warm and dry.  Psychiatric: She has a normal mood and affect. Her behavior is normal. Judgment and thought content normal.  Nursing note and vitals reviewed.    ED Treatments / Results  Labs (all labs ordered are listed, but only abnormal results are displayed) Labs Reviewed - No data to display  EKG  EKG Interpretation None       Radiology Ct Head Wo Contrast  Result Date: 05/26/2016 CLINICAL DATA:  Trip and fall with headaches, initial encounter EXAM: CT HEAD WITHOUT CONTRAST TECHNIQUE: Contiguous axial images were obtained from the base of the skull through the vertex without intravenous contrast. COMPARISON:  09/08/2015 FINDINGS: Brain: Mild atrophic changes and chronic white matter ischemic  change are noted. No findings to suggest acute hemorrhage, acute infarction or space-occupying mass lesion are seen. Vascular: No hyperdense vessel or unexpected calcification. Skull: Bony calvarium is intact. Sinuses/Orbits: Chronic changes are noted in the right maxillary antrum. The air-fluid level is noted within the left maxillary antrum as well a mucosal thickening in the ethmoid sinuses. Other: Arachnoid cyst is again seen in the left middle cranial fossa, stable from the prior exam. IMPRESSION: Chronic changes without acute abnormality. Electronically Signed   By: Inez Catalina M.D.   On: 05/26/2016 08:58    Procedures .Marland KitchenLaceration Repair Date/Time: 05/26/2016 8:25 AM Performed by: Isla Pence Authorized by: Isla Pence   Consent:    Consent obtained:  Verbal   Consent given by:  Patient   Risks discussed:  Infection, pain, need for additional repair and poor wound healing   Alternatives discussed:  No treatment Anesthesia (see MAR for exact dosages):    Anesthesia method:  Local infiltration   Local anesthetic:  Lidocaine 2% WITH epi Laceration details:    Location:  Scalp   Scalp location:  Occipital   Length (cm):  3 Repair type:    Repair type:  Simple Pre-procedure details:    Preparation:  Patient was prepped and draped in usual sterile fashion Exploration:    Hemostasis achieved with:  Epinephrine   Contaminated: no   Treatment:    Area cleansed with:  Saline   Amount of cleaning:  Standard   Irrigation solution:  Sterile saline   Irrigation method:  Syringe Skin repair:    Repair method:  Staples   Number of staples:  4 Approximation:    Approximation:  Close   Vermilion border: well-aligned   Post-procedure details:    Dressing:  Antibiotic ointment and sterile dressing   Patient tolerance of procedure:  Tolerated well, no immediate complications   (including critical care time)  Medications Ordered in ED Medications  lidocaine-EPINEPHrine  (XYLOCAINE W/EPI) 2 %-1:200000 (PF) injection 10 mL (10 mLs Infiltration Given 05/26/16 0821)  Tdap (BOOSTRIX) injection 0.5 mL (0.5 mLs Intramuscular Given 05/26/16 0830)     Initial Impression / Assessment and Plan / ED Course  I have reviewed the  triage vital signs and the nursing notes.  Pertinent labs & imaging results that were available during my care of the patient were reviewed by me and considered in my medical decision making (see chart for details).    Pt is awake and alert and stable for d/c.  She knows to return if worse.  Staples to be removed in 7 to 10 days.  Final Clinical Impressions(s) / ED Diagnoses   Final diagnoses:  Accidental fall, initial encounter  Laceration of scalp, initial encounter    New Prescriptions New Prescriptions   No medications on file     Isla Pence, MD 05/26/16 [phone]

## 2016-05-26 NOTE — ED Triage Notes (Signed)
Daughter stated, she said (pt) she was fighting with the covers and fell out of bed, hit the center of back of head, bleeding controlled.

## 2016-06-05 ENCOUNTER — Ambulatory Visit (INDEPENDENT_AMBULATORY_CARE_PROVIDER_SITE_OTHER): Payer: Medicare Other | Admitting: Family Medicine

## 2016-06-05 ENCOUNTER — Encounter: Payer: Self-pay | Admitting: Family Medicine

## 2016-06-05 VITALS — BP 160/100 | HR 76 | Temp 98.2°F | Resp 16 | Wt 168.0 lb

## 2016-06-05 DIAGNOSIS — Z4802 Encounter for removal of sutures: Secondary | ICD-10-CM

## 2016-06-05 NOTE — Progress Notes (Signed)
Subjective:    Patient ID: [name], female    DOB: 05-17-1932, 83 y.o.   MRN: [id number]  HPI Saturday, one week ago, the patient fell and struck her occiput on a nightstand suffering a horizontal laceration in the back of her head. In the emergency room this was closed with 4 staples. She is here today for staple removal. There is no erythema. There is no fluctuance. There is no sign of infection. The wound is well-healed. The patient denies any headache, dizziness, or neurologic deficit. Overall she is doing well Past Medical History:  Diagnosis Date  . CKD (chronic kidney disease) stage 3, GFR 30-59 ml/min   . Hypercholesterolemia   . Hypertension   . Mild acid reflux   . Mitral valve prolapse   . Renal insufficiency   . Spinal stenosis   . Staph infection     history of a wound infection following a previous right hip surgery  . Upper GI bleed    duodenal ulcer (09/2015) s/p endo clipping   Past Surgical History:  Procedure Laterality Date  . ABCESS DRAINAGE    . ABDOMINAL HYSTERECTOMY    . COLONOSCOPY  04/30/01   MCN:[id number] hemorrhoids, otherwise, normal rectum/colon/Normal terminal ileum  . COLONOSCOPY  03/02/2007   MOQ:HUTM sigmoid colon diverticula.  Otherwise no polyps, masses/Normal terminal ileum.  Approximately 10-15 cm of the distal terminal ileum visualized/normal view of the rectum  . ESOPHAGOGASTRODUODENOSCOPY    03/01/2007   LYY:TKPTWS esophagus without evidence of Barrett's, mass/Mild erythema in the antrum.  Distorted pylorus consistent with prior ulcer disease.  Biopsies obtained/ 3. Junction of D1 and D2 erythematous, edematous, and strictured.  The lumen was narrowed to approximately 10 mm.   . ESOPHAGOGASTRODUODENOSCOPY N/A 09/09/2015   Procedure: ESOPHAGOGASTRODUODENOSCOPY (EGD);  Surgeon: Jerene Bears, MD;  Location: [facility] ENDOSCOPY;  Service: Endoscopy;  Laterality: N/A;  . Left total hip replacement arthroplasty  June of 2001  . Right breast  lumpectomy    . Right total hip replacement arthroplasty  August of 2001  . TOTAL KNEE ARTHROPLASTY     Current Outpatient Prescriptions on File Prior to Visit  Medication Sig Dispense Refill  . albuterol (PROVENTIL HFA;VENTOLIN HFA) 108 (90 BASE) MCG/ACT inhaler Inhale 2 puffs into the lungs every 4 (four) hours as needed for wheezing or shortness of breath. 1 Inhaler 0  . albuterol (PROVENTIL) (2.5 MG/3ML) 0.083% nebulizer solution Take 3 mLs (2.5 mg total) by nebulization every 6 (six) hours as needed for wheezing or shortness of breath. 150 mL 0  . amLODipine (NORVASC) 10 MG tablet TAKE 1 TABLET BY MOUTH DAILY 90 tablet 3  . atorvastatin (LIPITOR) 20 MG tablet Take 1 tablet (20 mg total) by mouth daily. 90 tablet 3  . Biotin 2500 MCG CAPS Take 2,500 mg by mouth daily.    . butalbital-aspirin-caffeine (FIORINAL) 50-325-40 MG per capsule TAKE ONE CAPSULE BY MOUTH EVERY 8 HOURS AS NEEDED FOR HEADACHE 30 capsule 0  . cetirizine (ZYRTEC) 10 MG tablet Take 10 mg by mouth daily.    . citalopram (CELEXA) 20 MG tablet TAKE 1 TABLET DAILY 90 tablet 3  . clotrimazole-betamethasone (LOTRISONE) cream Apply 1 application topically 2 (two) times daily. (Patient taking differently: Apply 1 application topically 2 (two) times daily as needed. ) 30 g 0  . diclofenac sodium (VOLTAREN) 1 % GEL Apply 2 g topically 4 (four) times daily. 100 g 3  . donepezil (ARICEPT) 10 MG tablet Take 1 tablet (10 mg  total) by mouth at bedtime. 90 tablet 3  . ferrous sulfate 325 (65 FE) MG tablet Take 325 mg by mouth 2 (two) times daily with a meal.    . gabapentin (NEURONTIN) 300 MG capsule Take 1 capsule (300 mg total) by mouth 3 (three) times daily. 90 capsule 5  . HYDROcodone-acetaminophen (NORCO/VICODIN) 5-325 MG tablet Take 0.5-1 tablets by mouth every 6 (six) hours as needed for moderate pain. 60 tablet 0  . losartan-hydrochlorothiazide (HYZAAR) 50-12.5 MG tablet Take 1 tablet by mouth daily. 30 tablet 11  . memantine  (NAMENDA) 10 MG tablet Take 1 tablet (10 mg total) by mouth 2 (two) times daily. 90 tablet 3  . metoprolol tartrate (LOPRESSOR) 25 MG tablet TAKE 1 TABLET BY MOUTH TWICE A DAY 60 tablet 11  . montelukast (SINGULAIR) 10 MG tablet Take 1 tablet (10 mg total) by mouth at bedtime. 30 tablet 3  . Multiple Vitamin (MULTIVITAMIN WITH MINERALS) TABS tablet Take 1 tablet by mouth daily.    . pantoprazole (PROTONIX) 40 MG tablet Take 1 tablet (40 mg total) by mouth daily. 90 tablet 3  . vitamin B-12 (CYANOCOBALAMIN) 500 MCG tablet Take 500 mcg by mouth daily.     No current facility-administered medications on file prior to visit.    Allergies  Allergen Reactions  . Demerol [Meperidine] Other (See Comments)    Altered mental status  . Meperidine Hcl Other (See Comments)    Altered mental status   Social History   Social History  . Marital status: Widowed    Spouse name: N/A  . Number of children: N/A  . Years of education: N/A   Occupational History  . Not on file.   Social History Main Topics  . Smoking status: Never Smoker  . Smokeless tobacco: Never Used  . Alcohol use No  . Drug use: No  . Sexual activity: No     Comment: widowed   Other Topics Concern  . Not on file   Social History Narrative  . No narrative on file      Review of Systems  All other systems reviewed and are negative.      Objective:   Physical Exam  Constitutional: She is oriented to person, place, and time.  Cardiovascular: Normal rate and normal heart sounds.   Pulmonary/Chest: Effort normal and breath sounds normal.  Neurological: She is alert and oriented to person, place, and time. No cranial nerve deficit. Coordination normal.  Skin: No rash noted. No erythema.  Vitals reviewed.         Assessment & Plan:  Encounter for staple removal  4 staples removed without difficulty. No further follow-up is necessary for the head laceration. Blood pressures elevated but family states the blood  pressure is much better controlled at home. The patient does get anxious coming to the doctor secondary to her dementia

## 2016-06-18 ENCOUNTER — Telehealth: Payer: Self-pay | Admitting: Gastroenterology

## 2016-06-18 NOTE — Telephone Encounter (Signed)
Recall for tcs °

## 2016-06-18 NOTE — Telephone Encounter (Signed)
Letter mailed to pt.  

## 2016-07-10 ENCOUNTER — Ambulatory Visit (INDEPENDENT_AMBULATORY_CARE_PROVIDER_SITE_OTHER): Payer: Medicare Other | Admitting: Family Medicine

## 2016-07-10 ENCOUNTER — Encounter: Payer: Self-pay | Admitting: Family Medicine

## 2016-07-10 VITALS — BP 130/80 | HR 68 | Temp 97.4°F | Resp 16 | Wt 166.0 lb

## 2016-07-10 DIAGNOSIS — J209 Acute bronchitis, unspecified: Secondary | ICD-10-CM

## 2016-07-10 MED ORDER — AZITHROMYCIN 250 MG PO TABS: ORAL_TABLET | ORAL | 0 refills | Status: DC

## 2016-07-10 MED ORDER — BENZONATATE 100 MG PO CAPS: 200.0000 mg | ORAL_CAPSULE | Freq: Three times a day (TID) | ORAL | 0 refills | Status: DC | PRN

## 2016-07-10 NOTE — Progress Notes (Signed)
Subjective:    Patient ID: [name], female    DOB: [birth year], 83 y.o.   MRN: [id number]  HPI  Symptoms began 1 week ago. Symptoms include cough productive of brown sputum, wheezing, worsening cough. She denies any pleurisy. She denies any fever. She denies any shortness of breath. She does report worsening chest congestion. She denies any sinus pain. She denies any rhinorrhea. She denies any otalgia. She denies any sore throat. Past Medical History:  Diagnosis Date  . CKD (chronic kidney disease) stage 3, GFR 30-59 ml/min   . Hypercholesterolemia   . Hypertension   . Mild acid reflux   . Mitral valve prolapse   . Renal insufficiency   . Spinal stenosis   . Staph infection     history of a wound infection following a previous right hip surgery  . Upper GI bleed    duodenal ulcer (09/2015) s/p endo clipping   Past Surgical History:  Procedure Laterality Date  . ABCESS DRAINAGE    . ABDOMINAL HYSTERECTOMY    . COLONOSCOPY  04/30/01   TWS:FKCLEXNT hemorrhoids, otherwise, normal rectum/colon/Normal terminal ileum  . COLONOSCOPY  03/02/2007   ZGY:FVCB sigmoid colon diverticula.  Otherwise no polyps, masses/Normal terminal ileum.  Approximately 10-15 cm of the distal terminal ileum visualized/normal view of the rectum  . ESOPHAGOGASTRODUODENOSCOPY    03/01/2007   SWH:QPRFFM esophagus without evidence of Barrett's, mass/Mild erythema in the antrum.  Distorted pylorus consistent with prior ulcer disease.  Biopsies obtained/ 3. Junction of D1 and D2 erythematous, edematous, and strictured.  The lumen was narrowed to approximately 10 mm.   . ESOPHAGOGASTRODUODENOSCOPY N/A 09/09/2015   Procedure: ESOPHAGOGASTRODUODENOSCOPY (EGD);  Surgeon: Jerene Bears, MD;  Location: [facility] ENDOSCOPY;  Service: Endoscopy;  Laterality: N/A;  . Left total hip replacement arthroplasty  June of 2001  . Right breast lumpectomy    . Right total hip replacement arthroplasty  August of 2001  . TOTAL KNEE  ARTHROPLASTY     Current Outpatient Prescriptions on File Prior to Visit  Medication Sig Dispense Refill  . albuterol (PROVENTIL HFA;VENTOLIN HFA) 108 (90 BASE) MCG/ACT inhaler Inhale 2 puffs into the lungs every 4 (four) hours as needed for wheezing or shortness of breath. 1 Inhaler 0  . albuterol (PROVENTIL) (2.5 MG/3ML) 0.083% nebulizer solution Take 3 mLs (2.5 mg total) by nebulization every 6 (six) hours as needed for wheezing or shortness of breath. 150 mL 0  . amLODipine (NORVASC) 10 MG tablet TAKE 1 TABLET BY MOUTH DAILY 90 tablet 3  . atorvastatin (LIPITOR) 20 MG tablet Take 1 tablet (20 mg total) by mouth daily. 90 tablet 3  . Biotin 2500 MCG CAPS Take 2,500 mg by mouth daily.    . butalbital-aspirin-caffeine (FIORINAL) 50-325-40 MG per capsule TAKE ONE CAPSULE BY MOUTH EVERY 8 HOURS AS NEEDED FOR HEADACHE 30 capsule 0  . cetirizine (ZYRTEC) 10 MG tablet Take 10 mg by mouth daily.    . citalopram (CELEXA) 20 MG tablet TAKE 1 TABLET DAILY 90 tablet 3  . clotrimazole-betamethasone (LOTRISONE) cream Apply 1 application topically 2 (two) times daily. (Patient taking differently: Apply 1 application topically 2 (two) times daily as needed. ) 30 g 0  . diclofenac sodium (VOLTAREN) 1 % GEL Apply 2 g topically 4 (four) times daily. 100 g 3  . donepezil (ARICEPT) 10 MG tablet Take 1 tablet (10 mg total) by mouth at bedtime. 90 tablet 3  . ferrous sulfate 325 (65 FE) MG tablet Take 325  mg by mouth 2 (two) times daily with a meal.    . gabapentin (NEURONTIN) 300 MG capsule Take 1 capsule (300 mg total) by mouth 3 (three) times daily. 90 capsule 5  . HYDROcodone-acetaminophen (NORCO/VICODIN) 5-325 MG tablet Take 0.5-1 tablets by mouth every 6 (six) hours as needed for moderate pain. 60 tablet 0  . losartan-hydrochlorothiazide (HYZAAR) 50-12.5 MG tablet Take 1 tablet by mouth daily. 30 tablet 11  . memantine (NAMENDA) 10 MG tablet Take 1 tablet (10 mg total) by mouth 2 (two) times daily. 90 tablet 3    . metoprolol tartrate (LOPRESSOR) 25 MG tablet TAKE 1 TABLET BY MOUTH TWICE A DAY 60 tablet 11  . montelukast (SINGULAIR) 10 MG tablet Take 1 tablet (10 mg total) by mouth at bedtime. 30 tablet 3  . Multiple Vitamin (MULTIVITAMIN WITH MINERALS) TABS tablet Take 1 tablet by mouth daily.    . pantoprazole (PROTONIX) 40 MG tablet Take 1 tablet (40 mg total) by mouth daily. 90 tablet 3  . vitamin B-12 (CYANOCOBALAMIN) 500 MCG tablet Take 500 mcg by mouth daily.     No current facility-administered medications on file prior to visit.    Allergies  Allergen Reactions  . Demerol [Meperidine] Other (See Comments)    Altered mental status  . Meperidine Hcl Other (See Comments)    Altered mental status   Social History   Social History  . Marital status: Widowed    Spouse name: N/A  . Number of children: N/A  . Years of education: N/A   Occupational History  . Not on file.   Social History Main Topics  . Smoking status: Never Smoker  . Smokeless tobacco: Never Used  . Alcohol use No  . Drug use: No  . Sexual activity: No     Comment: widowed   Other Topics Concern  . Not on file   Social History Narrative  . No narrative on file     Review of Systems  All other systems reviewed and are negative.      Objective:   Physical Exam  Constitutional: She appears well-developed and well-nourished.  Neck: No JVD present.  Cardiovascular: Normal rate, regular rhythm and normal heart sounds.   No murmur heard. Pulmonary/Chest: Effort normal. No respiratory distress. She has wheezes.  Abdominal: Soft. Bowel sounds are normal. She exhibits no distension. There is no tenderness. There is no rebound and no guarding.  Musculoskeletal: She exhibits no edema.  Lymphadenopathy:    She has no cervical adenopathy.  Skin: Skin is warm.  Vitals reviewed.  + chest congestion bilaterally       Assessment & Plan:  Acute bronchitis, unspecified organism - Plan: azithromycin  (ZITHROMAX) 250 MG tablet, benzonatate (TESSALON) 100 MG capsule  Begin a Z-Pak 500 mg by mouth day one, 250 mg by mouth days 2 through 5. Add Tessalon Perles 200 mg every 8 hours when necessary cough. Recheck in one week.

## 2016-07-17 ENCOUNTER — Ambulatory Visit (INDEPENDENT_AMBULATORY_CARE_PROVIDER_SITE_OTHER): Payer: Medicare Other | Admitting: Family Medicine

## 2016-07-17 ENCOUNTER — Encounter: Payer: Self-pay | Admitting: Family Medicine

## 2016-07-17 VITALS — BP 130/90 | HR 74 | Temp 97.5°F | Resp 18 | Ht 61.0 in | Wt 164.0 lb

## 2016-07-17 DIAGNOSIS — L821 Other seborrheic keratosis: Secondary | ICD-10-CM | POA: Diagnosis not present

## 2016-07-17 DIAGNOSIS — D485 Neoplasm of uncertain behavior of skin: Secondary | ICD-10-CM

## 2016-07-17 NOTE — Progress Notes (Signed)
Subjective:    Patient ID: [name], female    DOB: Mar 27, 1932, 83 y.o.   MRN: [id number]  HPI 07/10/16 Symptoms began 1 week ago. Symptoms include cough productive of brown sputum, wheezing, worsening cough. She denies any pleurisy. She denies any fever. She denies any shortness of breath. She does report worsening chest congestion. She denies any sinus pain. She denies any rhinorrhea. She denies any otalgia. She denies any sore throat. At that time, my plan was: Begin a Z-Pak 500 mg by mouth day one, 250 mg by mouth days 2 through 5. Add Tessalon Perles 200 mg every 8 hours when necessary cough. Recheck in one week.  07/17/16 Lungs sound much better now. Cough is essentially resolved. She does have a suspicious lesion on her right hip and she would like removed   Past Medical History:  Diagnosis Date  . CKD (chronic kidney disease) stage 3, GFR 30-59 ml/min   . Hypercholesterolemia   . Hypertension   . Mild acid reflux   . Mitral valve prolapse   . Renal insufficiency   . Spinal stenosis   . Staph infection     history of a wound infection following a previous right hip surgery  . Upper GI bleed    duodenal ulcer (09/2015) s/p endo clipping   Past Surgical History:  Procedure Laterality Date  . ABCESS DRAINAGE    . ABDOMINAL HYSTERECTOMY    . COLONOSCOPY  04/30/01   QBH:[id number] hemorrhoids, otherwise, normal rectum/colon/Normal terminal ileum  . COLONOSCOPY  03/02/2007   IOX:BDZH sigmoid colon diverticula.  Otherwise no polyps, masses/Normal terminal ileum.  Approximately 10-15 cm of the distal terminal ileum visualized/normal view of the rectum  . ESOPHAGOGASTRODUODENOSCOPY    03/01/2007   GDJ:MEQAST esophagus without evidence of Barrett's, mass/Mild erythema in the antrum.  Distorted pylorus consistent with prior ulcer disease.  Biopsies obtained/ 3. Junction of D1 and D2 erythematous, edematous, and strictured.  The lumen was narrowed to approximately 10 mm.   .  ESOPHAGOGASTRODUODENOSCOPY N/A 09/09/2015   Procedure: ESOPHAGOGASTRODUODENOSCOPY (EGD);  Surgeon: Jerene Bears, MD;  Location: [facility] ENDOSCOPY;  Service: Endoscopy;  Laterality: N/A;  . Left total hip replacement arthroplasty  June of 2001  . Right breast lumpectomy    . Right total hip replacement arthroplasty  August of 2001  . TOTAL KNEE ARTHROPLASTY     Current Outpatient Prescriptions on File Prior to Visit  Medication Sig Dispense Refill  . albuterol (PROVENTIL HFA;VENTOLIN HFA) 108 (90 BASE) MCG/ACT inhaler Inhale 2 puffs into the lungs every 4 (four) hours as needed for wheezing or shortness of breath. 1 Inhaler 0  . albuterol (PROVENTIL) (2.5 MG/3ML) 0.083% nebulizer solution Take 3 mLs (2.5 mg total) by nebulization every 6 (six) hours as needed for wheezing or shortness of breath. 150 mL 0  . amLODipine (NORVASC) 10 MG tablet TAKE 1 TABLET BY MOUTH DAILY 90 tablet 3  . atorvastatin (LIPITOR) 20 MG tablet Take 1 tablet (20 mg total) by mouth daily. 90 tablet 3  . Biotin 2500 MCG CAPS Take 2,500 mg by mouth daily.    . butalbital-aspirin-caffeine (FIORINAL) 50-325-40 MG per capsule TAKE ONE CAPSULE BY MOUTH EVERY 8 HOURS AS NEEDED FOR HEADACHE 30 capsule 0  . cetirizine (ZYRTEC) 10 MG tablet Take 10 mg by mouth daily.    . citalopram (CELEXA) 20 MG tablet TAKE 1 TABLET DAILY 90 tablet 3  . clotrimazole-betamethasone (LOTRISONE) cream Apply 1 application topically 2 (two) times daily. (Patient  taking differently: Apply 1 application topically 2 (two) times daily as needed. ) 30 g 0  . donepezil (ARICEPT) 10 MG tablet Take 1 tablet (10 mg total) by mouth at bedtime. 90 tablet 3  . ferrous sulfate 325 (65 FE) MG tablet Take 325 mg by mouth 2 (two) times daily with a meal.    . gabapentin (NEURONTIN) 300 MG capsule Take 1 capsule (300 mg total) by mouth 3 (three) times daily. 90 capsule 5  . HYDROcodone-acetaminophen (NORCO/VICODIN) 5-325 MG tablet Take 0.5-1 tablets by mouth every 6 (six)  hours as needed for moderate pain. 60 tablet 0  . memantine (NAMENDA) 10 MG tablet Take 1 tablet (10 mg total) by mouth 2 (two) times daily. 90 tablet 3  . metoprolol tartrate (LOPRESSOR) 25 MG tablet TAKE 1 TABLET BY MOUTH TWICE A DAY 60 tablet 11  . Multiple Vitamin (MULTIVITAMIN WITH MINERALS) TABS tablet Take 1 tablet by mouth daily.    . vitamin B-12 (CYANOCOBALAMIN) 500 MCG tablet Take 500 mcg by mouth daily.    . pantoprazole (PROTONIX) 40 MG tablet Take 1 tablet (40 mg total) by mouth daily. (Patient not taking: Reported on 07/17/2016) 90 tablet 3   No current facility-administered medications on file prior to visit.    Allergies  Allergen Reactions  . Demerol [Meperidine] Other (See Comments)    Altered mental status  . Meperidine Hcl Other (See Comments)    Altered mental status   Social History   Social History  . Marital status: Widowed    Spouse name: N/A  . Number of children: N/A  . Years of education: N/A   Occupational History  . Not on file.   Social History Main Topics  . Smoking status: Never Smoker  . Smokeless tobacco: Never Used  . Alcohol use No  . Drug use: No  . Sexual activity: No     Comment: widowed   Other Topics Concern  . Not on file   Social History Narrative  . No narrative on file     Review of Systems  All other systems reviewed and are negative.      Objective:   Physical Exam  Constitutional: She appears well-developed and well-nourished.  Neck: No JVD present.  Cardiovascular: Normal rate, regular rhythm and normal heart sounds.   No murmur heard. Pulmonary/Chest: Effort normal. No respiratory distress. She has no wheezes. She has no rales.  Abdominal: Soft. Bowel sounds are normal. She exhibits no distension. There is no tenderness. There is no rebound and no guarding.  Musculoskeletal: She exhibits no edema.  Lymphadenopathy:    She has no cervical adenopathy.  Skin: Skin is warm.  Vitals reviewed.   Lesion on the  right hip superior to the greater trochanter is a 4 mm erythematous scaly papule with excoriation. Differential diagnosis includes irritated SK versus actinic keratoses versus irritated skin tag versus cancer    Assessment & Plan:  Neoplasm of uncertain behavior of skin - Plan: Pathology Lesion was anesthetized 0.1% lidocaine with epinephrine. Shave biopsy was performed using sterile technique and the lesion was sent to pathology in a labeled container. Hemostasis was achieved with Drysol and Band-Aid

## 2016-07-18 LAB — PATHOLOGY

## 2016-08-27 ENCOUNTER — Other Ambulatory Visit: Payer: Self-pay | Admitting: Family Medicine

## 2016-08-31 ENCOUNTER — Encounter: Payer: Self-pay | Admitting: Family Medicine

## 2016-08-31 ENCOUNTER — Ambulatory Visit (INDEPENDENT_AMBULATORY_CARE_PROVIDER_SITE_OTHER): Payer: Medicare Other | Admitting: Family Medicine

## 2016-08-31 VITALS — BP 128/82 | HR 84 | Temp 98.0°F | Resp 18 | Ht 61.0 in | Wt 167.0 lb

## 2016-08-31 DIAGNOSIS — G8929 Other chronic pain: Secondary | ICD-10-CM | POA: Diagnosis not present

## 2016-08-31 DIAGNOSIS — M25561 Pain in right knee: Secondary | ICD-10-CM

## 2016-08-31 DIAGNOSIS — M25562 Pain in left knee: Secondary | ICD-10-CM

## 2016-08-31 NOTE — Progress Notes (Signed)
Subjective:    Patient ID: [name], female    DOB: November 02, 1932, 83 y.o.   MRN: [id number]  HPI Patient presents with severe bilateral knee pain. She has a history of a knee replacement more than 20 years ago in her left knee per her report. She also has severe osteoarthritis in her right knee. She has a difficult time even standing up to walk. She reports sharp pain below her kneecap on the right side whenever she walks. She reports stabbing pain in both knees with ambulation. She has a hard time getting in and out of a chair. However pain is present even at night when she tries to sleep. She denies any falls or injuries. Past Medical History:  Diagnosis Date  . CKD (chronic kidney disease) stage 3, GFR 30-59 ml/min   . Hypercholesterolemia   . Hypertension   . Mild acid reflux   . Mitral valve prolapse   . Renal insufficiency   . Spinal stenosis   . Staph infection     history of a wound infection following a previous right hip surgery  . Upper GI bleed    duodenal ulcer (09/2015) s/p endo clipping   Past Surgical History:  Procedure Laterality Date  . ABCESS DRAINAGE    . ABDOMINAL HYSTERECTOMY    . COLONOSCOPY  04/30/01   YTK:[id number] hemorrhoids, otherwise, normal rectum/colon/Normal terminal ileum  . COLONOSCOPY  03/02/2007   EXN:TZGY sigmoid colon diverticula.  Otherwise no polyps, masses/Normal terminal ileum.  Approximately 10-15 cm of the distal terminal ileum visualized/normal view of the rectum  . ESOPHAGOGASTRODUODENOSCOPY    03/01/2007   FVC:BSWHQP esophagus without evidence of Barrett's, mass/Mild erythema in the antrum.  Distorted pylorus consistent with prior ulcer disease.  Biopsies obtained/ 3. Junction of D1 and D2 erythematous, edematous, and strictured.  The lumen was narrowed to approximately 10 mm.   . ESOPHAGOGASTRODUODENOSCOPY N/A 09/09/2015   Procedure: ESOPHAGOGASTRODUODENOSCOPY (EGD);  Surgeon: Jerene Bears, MD;  Location: [facility] ENDOSCOPY;  Service:  Endoscopy;  Laterality: N/A;  . Left total hip replacement arthroplasty  June of 2001  . Right breast lumpectomy    . Right total hip replacement arthroplasty  August of 2001  . TOTAL KNEE ARTHROPLASTY     Current Outpatient Prescriptions on File Prior to Visit  Medication Sig Dispense Refill  . albuterol (PROVENTIL HFA;VENTOLIN HFA) 108 (90 BASE) MCG/ACT inhaler Inhale 2 puffs into the lungs every 4 (four) hours as needed for wheezing or shortness of breath. 1 Inhaler 0  . albuterol (PROVENTIL) (2.5 MG/3ML) 0.083% nebulizer solution Take 3 mLs (2.5 mg total) by nebulization every 6 (six) hours as needed for wheezing or shortness of breath. 150 mL 0  . amLODipine (NORVASC) 10 MG tablet TAKE 1 TABLET BY MOUTH DAILY 90 tablet 3  . atorvastatin (LIPITOR) 20 MG tablet Take 1 tablet (20 mg total) by mouth daily. 90 tablet 3  . Biotin 2500 MCG CAPS Take 2,500 mg by mouth daily.    . butalbital-aspirin-caffeine (FIORINAL) 50-325-40 MG per capsule TAKE ONE CAPSULE BY MOUTH EVERY 8 HOURS AS NEEDED FOR HEADACHE 30 capsule 0  . cetirizine (ZYRTEC) 10 MG tablet Take 10 mg by mouth daily.    . citalopram (CELEXA) 20 MG tablet TAKE 1 TABLET DAILY 90 tablet 3  . clotrimazole-betamethasone (LOTRISONE) cream Apply 1 application topically 2 (two) times daily. (Patient taking differently: Apply 1 application topically 2 (two) times daily as needed. ) 30 g 0  . donepezil (ARICEPT) 10  MG tablet Take 1 tablet (10 mg total) by mouth at bedtime. 90 tablet 3  . ferrous sulfate 325 (65 FE) MG tablet Take 325 mg by mouth 2 (two) times daily with a meal.    . gabapentin (NEURONTIN) 300 MG capsule Take 1 capsule (300 mg total) by mouth 3 (three) times daily. 90 capsule 5  . HYDROcodone-acetaminophen (NORCO/VICODIN) 5-325 MG tablet Take 0.5-1 tablets by mouth every 6 (six) hours as needed for moderate pain. 60 tablet 0  . memantine (NAMENDA) 10 MG tablet Take 1 tablet (10 mg total) by mouth 2 (two) times daily. 90 tablet 3    . metoprolol tartrate (LOPRESSOR) 25 MG tablet TAKE 1 TABLET BY MOUTH TWICE A DAY 60 tablet 11  . Multiple Vitamin (MULTIVITAMIN WITH MINERALS) TABS tablet Take 1 tablet by mouth daily.    . vitamin B-12 (CYANOCOBALAMIN) 500 MCG tablet Take 500 mcg by mouth daily.    . pantoprazole (PROTONIX) 40 MG tablet Take 1 tablet (40 mg total) by mouth daily. (Patient not taking: Reported on 07/17/2016) 90 tablet 3   No current facility-administered medications on file prior to visit.    Allergies  Allergen Reactions  . Demerol [Meperidine] Other (See Comments)    Altered mental status  . Meperidine Hcl Other (See Comments)    Altered mental status   Social History   Social History  . Marital status: Widowed    Spouse name: N/A  . Number of children: N/A  . Years of education: N/A   Occupational History  . Not on file.   Social History Main Topics  . Smoking status: Never Smoker  . Smokeless tobacco: Never Used  . Alcohol use No  . Drug use: No  . Sexual activity: No     Comment: widowed   Other Topics Concern  . Not on file   Social History Narrative  . No narrative on file      Review of Systems  All other systems reviewed and are negative.      Objective:   Physical Exam  Cardiovascular: Normal rate, regular rhythm and normal heart sounds.   Pulmonary/Chest: Effort normal and breath sounds normal.  Musculoskeletal:       Right knee: She exhibits decreased range of motion and swelling. She exhibits no LCL laxity, normal meniscus and no MCL laxity. Tenderness found. Medial joint line and lateral joint line tenderness noted.       Left knee: She exhibits decreased range of motion. She exhibits no swelling and no effusion. Tenderness found. Medial joint line and lateral joint line tenderness noted.  Vitals reviewed.         Assessment & Plan:  Chronic pain of both knees  Using sterile technique, I injected the right knee with a mixture of 5 mL of lidocaine and 2 mL  of 40 mg per mL Kenalog. Patient tolerated the procedure well without complication. I next injected the left knee with a mixture of 2 mL of 40 mg per mL Kenalog and 5 mL of lidocaine. She tolerated that procedure well without complication. She is unable to take NSAIDs due to history of renal insufficiency as well as a history of an upper GI bleed. Therefore her options to manage her knee pain are limited.

## 2016-09-17 ENCOUNTER — Ambulatory Visit (INDEPENDENT_AMBULATORY_CARE_PROVIDER_SITE_OTHER): Payer: Medicare Other | Admitting: Physician Assistant

## 2016-09-17 ENCOUNTER — Encounter: Payer: Self-pay | Admitting: Physician Assistant

## 2016-09-17 VITALS — BP 124/70 | HR 80 | Temp 97.9°F | Resp 16 | Wt 160.6 lb

## 2016-09-17 DIAGNOSIS — J4521 Mild intermittent asthma with (acute) exacerbation: Secondary | ICD-10-CM

## 2016-09-17 DIAGNOSIS — J988 Other specified respiratory disorders: Secondary | ICD-10-CM | POA: Diagnosis not present

## 2016-09-17 DIAGNOSIS — B9689 Other specified bacterial agents as the cause of diseases classified elsewhere: Secondary | ICD-10-CM

## 2016-09-17 MED ORDER — PREDNISONE 20 MG PO TABS: ORAL_TABLET | ORAL | 0 refills | Status: DC

## 2016-09-17 MED ORDER — AZITHROMYCIN 250 MG PO TABS: ORAL_TABLET | ORAL | 0 refills | Status: DC

## 2016-09-17 NOTE — Progress Notes (Signed)
Patient ID: [name] MRN: [id number], DOB: February 27, 1933, 83 y.o. Date of Encounter: 09/17/2016, 3:07 PM    Chief Complaint:  Chief Complaint  Patient presents with  . low grade fever    x6days  . chillis  . Generalized Body Aches   - cough  HPI: 83 y.o. year old female presents with above. Her daughter is here with her for office visit and does the talking. States that they babysat grandson couple weeks ago because he had ear infection and fever. Both the patient and the daughter who is here--they both started developing symptoms after that. Daughter states that she is having the same symptoms as patient. Reports that patient "just hasn't been feeling good", has had cough, has had head and nasal congestion. Is blowing mucus from her nose. Is unable to produce/cough up any phlegm. Has had no sore throat. Her throat has felt a little scratchy. Has not had to use her albuterol inhaler recently. Has had some low-grade fever.     Home Meds:   Outpatient Medications Prior to Visit  Medication Sig Dispense Refill  . albuterol (PROVENTIL HFA;VENTOLIN HFA) 108 (90 BASE) MCG/ACT inhaler Inhale 2 puffs into the lungs every 4 (four) hours as needed for wheezing or shortness of breath. 1 Inhaler 0  . albuterol (PROVENTIL) (2.5 MG/3ML) 0.083% nebulizer solution Take 3 mLs (2.5 mg total) by nebulization every 6 (six) hours as needed for wheezing or shortness of breath. 150 mL 0  . amLODipine (NORVASC) 10 MG tablet TAKE 1 TABLET BY MOUTH DAILY 90 tablet 3  . atorvastatin (LIPITOR) 20 MG tablet Take 1 tablet (20 mg total) by mouth daily. 90 tablet 3  . Biotin 2500 MCG CAPS Take 2,500 mg by mouth daily.    . butalbital-aspirin-caffeine (FIORINAL) 50-325-40 MG per capsule TAKE ONE CAPSULE BY MOUTH EVERY 8 HOURS AS NEEDED FOR HEADACHE 30 capsule 0  . cetirizine (ZYRTEC) 10 MG tablet Take 10 mg by mouth daily.    . citalopram (CELEXA) 20 MG tablet TAKE 1 TABLET DAILY 90 tablet 3  .  clotrimazole-betamethasone (LOTRISONE) cream Apply 1 application topically 2 (two) times daily. (Patient taking differently: Apply 1 application topically 2 (two) times daily as needed. ) 30 g 0  . donepezil (ARICEPT) 10 MG tablet Take 1 tablet (10 mg total) by mouth at bedtime. 90 tablet 3  . ferrous sulfate 325 (65 FE) MG tablet Take 325 mg by mouth 2 (two) times daily with a meal.    . gabapentin (NEURONTIN) 300 MG capsule Take 1 capsule (300 mg total) by mouth 3 (three) times daily. 90 capsule 5  . HYDROcodone-acetaminophen (NORCO/VICODIN) 5-325 MG tablet Take 0.5-1 tablets by mouth every 6 (six) hours as needed for moderate pain. 60 tablet 0  . memantine (NAMENDA) 10 MG tablet Take 1 tablet (10 mg total) by mouth 2 (two) times daily. 90 tablet 3  . metoprolol tartrate (LOPRESSOR) 25 MG tablet TAKE 1 TABLET BY MOUTH TWICE A DAY 60 tablet 11  . Multiple Vitamin (MULTIVITAMIN WITH MINERALS) TABS tablet Take 1 tablet by mouth daily.    . pantoprazole (PROTONIX) 40 MG tablet Take 1 tablet (40 mg total) by mouth daily. 90 tablet 3  . vitamin B-12 (CYANOCOBALAMIN) 500 MCG tablet Take 500 mcg by mouth daily.     No facility-administered medications prior to visit.     Allergies:  Allergies  Allergen Reactions  . Demerol [Meperidine] Other (See Comments)    Altered mental status  .  Meperidine Hcl Other (See Comments)    Altered mental status      Review of Systems: See HPI for pertinent ROS. All other ROS negative.    Physical Exam: Blood pressure 124/70, pulse 80, temperature 97.9 F (36.6 C), temperature source Oral, resp. rate 16, weight 160 lb 9.6 oz (72.8 kg), SpO2 93 %., Body mass index is 30.35 kg/m. General:  WF. Appears in no acute distress. HEENT: Normocephalic, atraumatic, eyes without discharge, sclera non-icteric, nares are without discharge. Bilateral auditory canals clear, TM's are without perforation, pearly grey and translucent with reflective cone of light bilaterally.  Oral cavity moist, posterior pharynx without exudate, erythema, peritonsillar abscess. No tenderness with percussion to frontal or maxillary sinuses bilaterally.  Neck: Supple. No thyromegaly. No lymphadenopathy. Lungs: Very slight wheeze--at upper lungs but really hear no wheeze in the lower lungs. Hear no rhonchi or rales. Has good breath sounds and good air movement. Heart: Regular rhythm. No murmurs, rubs, or gallops. Msk:  Strength and tone normal for age. Extremities/Skin: Warm and dry.  Neuro: Alert and oriented X 3. Moves all extremities spontaneously. Gait is normal. CNII-XII grossly in tact. Psych:  Responds to questions appropriately with a normal affect.     ASSESSMENT AND PLAN:  83 y.o. year old female with  1. Bacterial respiratory infection - azithromycin (ZITHROMAX) 250 MG tablet; Day 1: Take 2 daily.  Days 2 - 5: Take 1 daily.  Dispense: 6 tablet; Refill: 0  2. Mild intermittent asthma with exacerbation - predniSONE (DELTASONE) 20 MG tablet; Take once a day for 5 days  Dispense: 5 tablet; Refill: 0  Go ahead and give low-dose prednisone She is to also take the antibiotic as directed. Also use albuterol inhaler if needed. Also take Mucinex DM on a regular basis for 5 days. Also drink lots of clear liquids. Follow up if symptoms worsen or not resolving within 1 week after completion of antibiotic.  Marin Olp [place], [place], [facility] 09/17/2016 3:07 PM

## 2016-09-24 ENCOUNTER — Ambulatory Visit
Admission: RE | Admit: 2016-09-24 | Discharge: 2016-09-24 | Disposition: A | Discharge status: Home / Self Care | Payer: Medicare Other | Source: Ambulatory Visit | Attending: Family Medicine | Admitting: Family Medicine

## 2016-09-24 ENCOUNTER — Ambulatory Visit (INDEPENDENT_AMBULATORY_CARE_PROVIDER_SITE_OTHER): Payer: Medicare Other | Admitting: Family Medicine

## 2016-09-24 ENCOUNTER — Encounter: Payer: Self-pay | Admitting: Family Medicine

## 2016-09-24 VITALS — BP 170/86 | HR 80 | Temp 98.4°F | Resp 18 | Ht 61.0 in | Wt 161.0 lb

## 2016-09-24 DIAGNOSIS — R05 Cough: Secondary | ICD-10-CM | POA: Diagnosis not present

## 2016-09-24 DIAGNOSIS — J189 Pneumonia, unspecified organism: Secondary | ICD-10-CM

## 2016-09-24 MED ORDER — LEVOFLOXACIN 500 MG PO TABS: 500.0000 mg | ORAL_TABLET | Freq: Every day | ORAL | 0 refills | Status: DC

## 2016-09-24 NOTE — Progress Notes (Signed)
Subjective:    Patient ID: [name], female    DOB: 1932-11-11, 83 y.o.   MRN: [id number]  HPI  Symptoms began 2 weeks ago. Symptoms includeA nonproductive cough, increasing shortness of breath, pleurisy. She denies any wheezing. She denies any hemoptysis. She denies any fevers. She saw my partner one week ago and was started on a Z-Pak and prednisone for symptoms of worsened. Past Medical History:  Diagnosis Date  . CKD (chronic kidney disease) stage 3, GFR 30-59 ml/min   . Hypercholesterolemia   . Hypertension   . Mild acid reflux   . Mitral valve prolapse   . Renal insufficiency   . Spinal stenosis   . Staph infection     history of a wound infection following a previous right hip surgery  . Upper GI bleed    duodenal ulcer (09/2015) s/p endo clipping   Past Surgical History:  Procedure Laterality Date  . ABCESS DRAINAGE    . ABDOMINAL HYSTERECTOMY    . COLONOSCOPY  04/30/01   UEK:[id number] hemorrhoids, otherwise, normal rectum/colon/Normal terminal ileum  . COLONOSCOPY  03/02/2007   HXT:AVWP sigmoid colon diverticula.  Otherwise no polyps, masses/Normal terminal ileum.  Approximately 10-15 cm of the distal terminal ileum visualized/normal view of the rectum  . ESOPHAGOGASTRODUODENOSCOPY    03/01/2007   VXY:IAXKPV esophagus without evidence of Barrett's, mass/Mild erythema in the antrum.  Distorted pylorus consistent with prior ulcer disease.  Biopsies obtained/ 3. Junction of D1 and D2 erythematous, edematous, and strictured.  The lumen was narrowed to approximately 10 mm.   . ESOPHAGOGASTRODUODENOSCOPY N/A 09/09/2015   Procedure: ESOPHAGOGASTRODUODENOSCOPY (EGD);  Surgeon: Jerene Bears, MD;  Location: [facility] ENDOSCOPY;  Service: Endoscopy;  Laterality: N/A;  . Left total hip replacement arthroplasty  June of 2001  . Right breast lumpectomy    . Right total hip replacement arthroplasty  August of 2001  . TOTAL KNEE ARTHROPLASTY     Current Outpatient Prescriptions on File  Prior to Visit  Medication Sig Dispense Refill  . albuterol (PROVENTIL HFA;VENTOLIN HFA) 108 (90 BASE) MCG/ACT inhaler Inhale 2 puffs into the lungs every 4 (four) hours as needed for wheezing or shortness of breath. 1 Inhaler 0  . albuterol (PROVENTIL) (2.5 MG/3ML) 0.083% nebulizer solution Take 3 mLs (2.5 mg total) by nebulization every 6 (six) hours as needed for wheezing or shortness of breath. 150 mL 0  . amLODipine (NORVASC) 10 MG tablet TAKE 1 TABLET BY MOUTH DAILY 90 tablet 3  . atorvastatin (LIPITOR) 20 MG tablet Take 1 tablet (20 mg total) by mouth daily. 90 tablet 3  . Biotin 2500 MCG CAPS Take 2,500 mg by mouth daily.    . butalbital-aspirin-caffeine (FIORINAL) 50-325-40 MG per capsule TAKE ONE CAPSULE BY MOUTH EVERY 8 HOURS AS NEEDED FOR HEADACHE 30 capsule 0  . cetirizine (ZYRTEC) 10 MG tablet Take 10 mg by mouth daily.    . citalopram (CELEXA) 20 MG tablet TAKE 1 TABLET DAILY 90 tablet 3  . clotrimazole-betamethasone (LOTRISONE) cream Apply 1 application topically 2 (two) times daily. (Patient taking differently: Apply 1 application topically 2 (two) times daily as needed. ) 30 g 0  . donepezil (ARICEPT) 10 MG tablet Take 1 tablet (10 mg total) by mouth at bedtime. 90 tablet 3  . ferrous sulfate 325 (65 FE) MG tablet Take 325 mg by mouth 2 (two) times daily with a meal.    . gabapentin (NEURONTIN) 300 MG capsule Take 1 capsule (300 mg total) by mouth  3 (three) times daily. 90 capsule 5  . HYDROcodone-acetaminophen (NORCO/VICODIN) 5-325 MG tablet Take 0.5-1 tablets by mouth every 6 (six) hours as needed for moderate pain. 60 tablet 0  . memantine (NAMENDA) 10 MG tablet Take 1 tablet (10 mg total) by mouth 2 (two) times daily. 90 tablet 3  . metoprolol tartrate (LOPRESSOR) 25 MG tablet TAKE 1 TABLET BY MOUTH TWICE A DAY 60 tablet 11  . Multiple Vitamin (MULTIVITAMIN WITH MINERALS) TABS tablet Take 1 tablet by mouth daily.    . pantoprazole (PROTONIX) 40 MG tablet Take 1 tablet (40 mg  total) by mouth daily. 90 tablet 3  . vitamin B-12 (CYANOCOBALAMIN) 500 MCG tablet Take 500 mcg by mouth daily.     No current facility-administered medications on file prior to visit.    Allergies  Allergen Reactions  . Demerol [Meperidine] Other (See Comments)    Altered mental status  . Meperidine Hcl Other (See Comments)    Altered mental status   Social History   Social History  . Marital status: Widowed    Spouse name: N/A  . Number of children: N/A  . Years of education: N/A   Occupational History  . Not on file.   Social History Main Topics  . Smoking status: Never Smoker  . Smokeless tobacco: Never Used  . Alcohol use No  . Drug use: No  . Sexual activity: No     Comment: widowed   Other Topics Concern  . Not on file   Social History Narrative  . No narrative on file     Review of Systems  All other systems reviewed and are negative.      Objective:   Physical Exam  Constitutional: She appears well-developed and well-nourished.  Neck: No JVD present.  Cardiovascular: Normal rate, regular rhythm and normal heart sounds.   No murmur heard. Pulmonary/Chest: Effort normal. No respiratory distress. She has wheezes.  Abdominal: Soft. Bowel sounds are normal. She exhibits no distension. There is no tenderness. There is no rebound and no guarding.  Musculoskeletal: She exhibits no edema.  Lymphadenopathy:    She has no cervical adenopathy.  Skin: Skin is warm.  Vitals reviewed.  + chest congestion bilaterally       Assessment & Plan:  Walking pneumonia - Plan: levofloxacin (LEVAQUIN) 500 MG tablet, DG Chest 2 View  Begin levaquin 500 mg poqday for 7 days for walking pneumonia.  Check CXR.  Recheck in one week or sooner if worse

## 2016-10-11 ENCOUNTER — Other Ambulatory Visit: Payer: Self-pay | Admitting: Family Medicine

## 2016-10-19 ENCOUNTER — Ambulatory Visit: Payer: Medicare Other | Admitting: Family Medicine

## 2016-10-22 ENCOUNTER — Encounter: Payer: Self-pay | Admitting: Family Medicine

## 2016-11-02 IMAGING — CT CT ABD-PELV W/O CM
2 of 4 series · 11 of 46 positions shown, 12 images · non-contrast
Comparison: CT abdomen dated 06/10/2007.

CLINICAL DATA: Diffuse abdominal pain, nausea and vomiting,
shortness of breath. History of chronic kidney disease.

Evaluate for possibility of intra-abdominal bleed.
EXAM:
CT ABDOMEN AND PELVIS WITHOUT CONTRAST
TECHNIQUE: Multidetector CT imaging of the abdomen and pelvis was performed
following the standard protocol without IV contrast.

[Series 201: routine, idose (2) · axial · 0.71mm/px · z∈[+462,+817]mm · 8 of 85 slices shown, 9 images]
[im 7/85  soft-tissue]
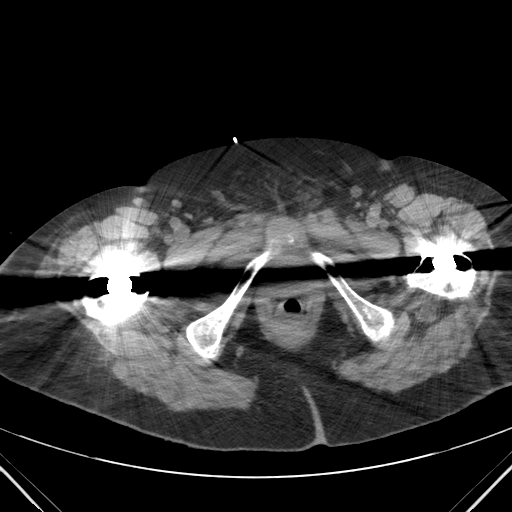
[im 7/85  bone]
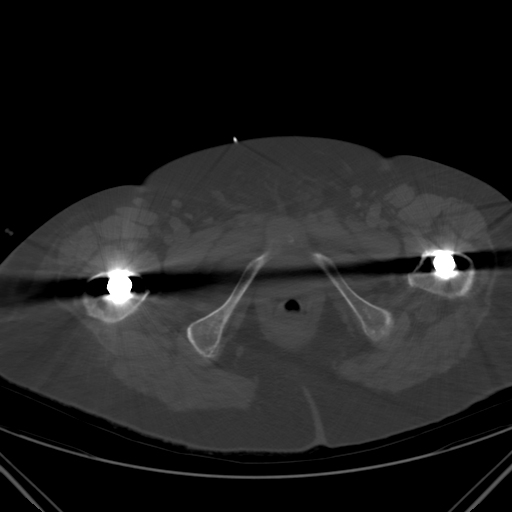
[im 17/85  soft-tissue]
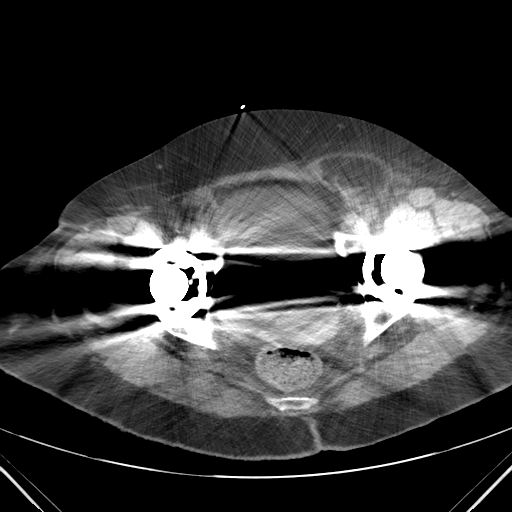
[im 27/85  soft-tissue]
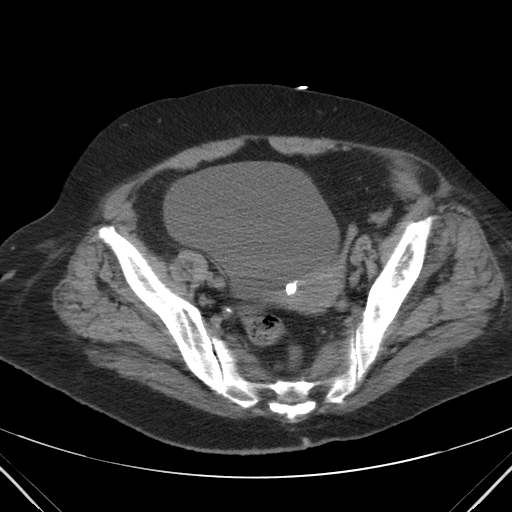
[im 37/85  soft-tissue]
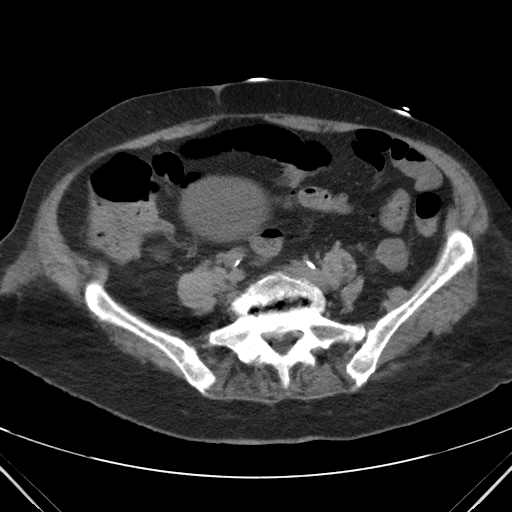
[im 48/85  soft-tissue]
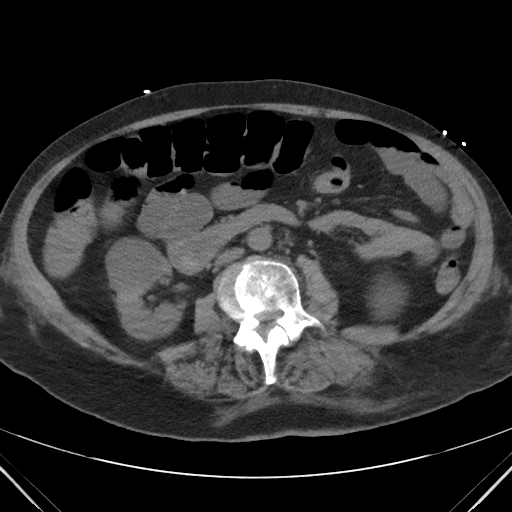
[im 58/85  soft-tissue]
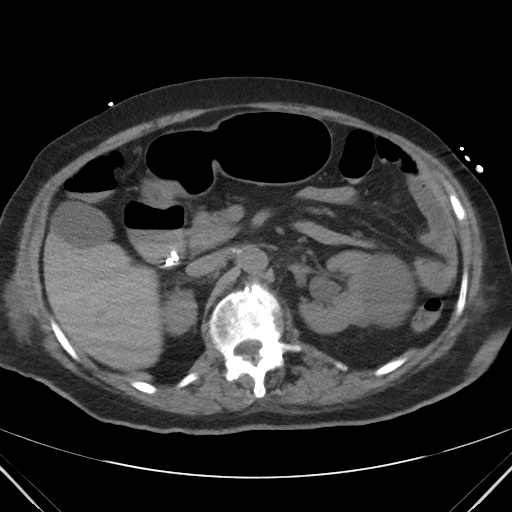
[im 68/85  soft-tissue]
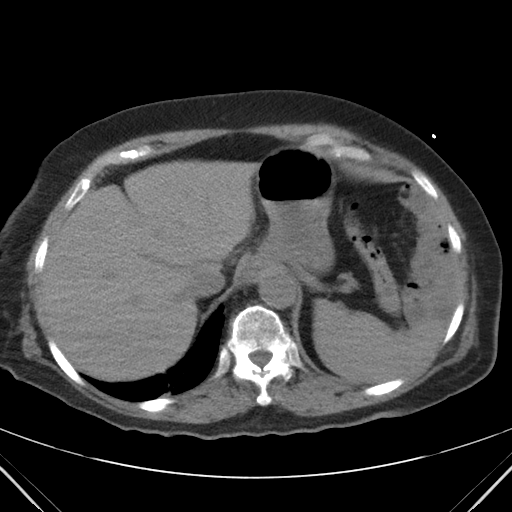
[im 78/85  soft-tissue]
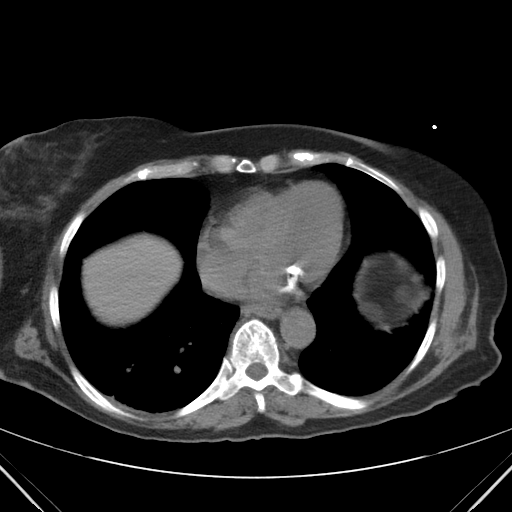

[Series 203: coronals, idose (2) · coronal · 0.45mm/px · 3 of 129 slices shown]
[im 43/129  soft-tissue]
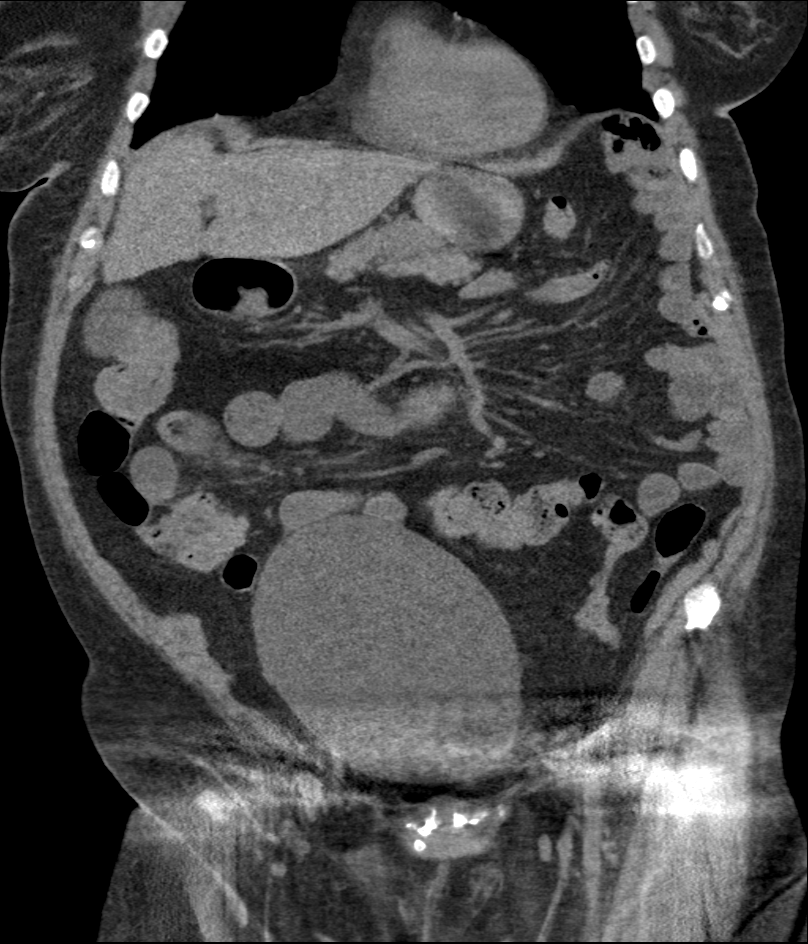
[im 57/129  soft-tissue]
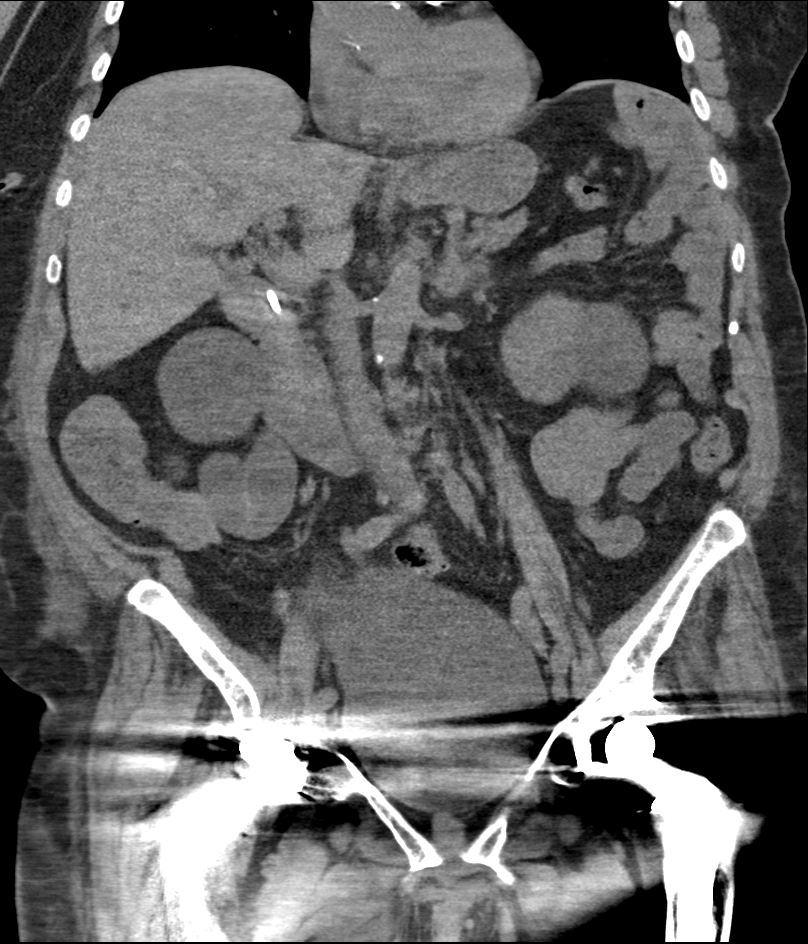
[im 72/129  soft-tissue]
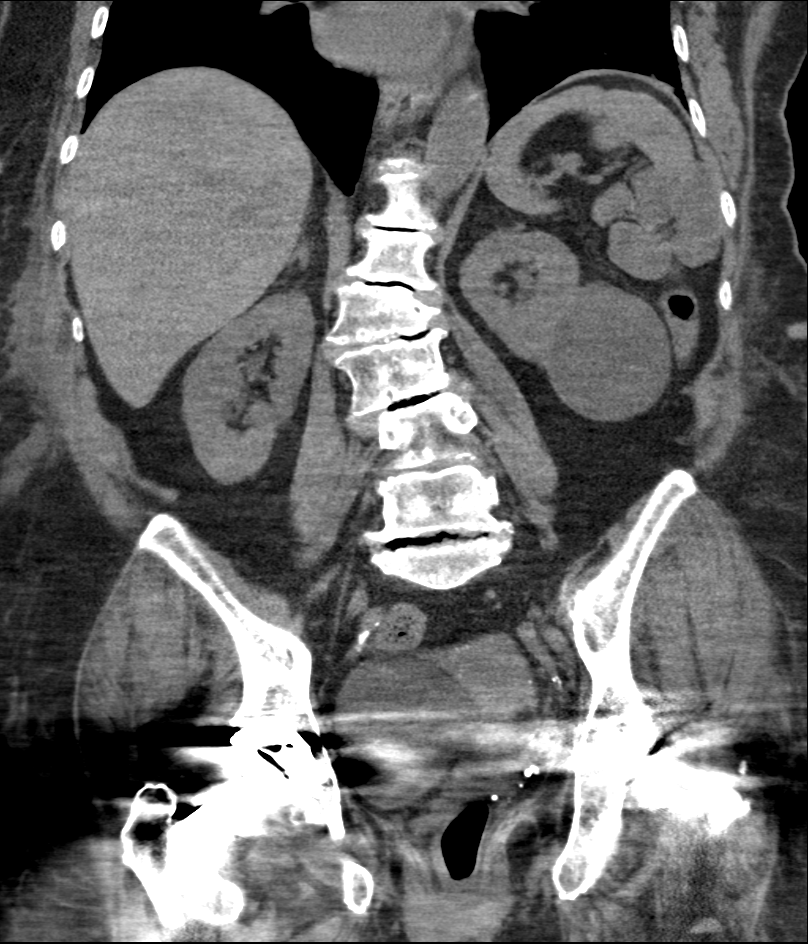

[11 of 46 positions shown; findings below may reference images not displayed]

FINDINGS: Lower chest: Mild atelectasis at each lung base. Mitral annulus
calcifications and coronary artery calcifications noted,
incompletely imaged.

Hepatobiliary: No mass visualized within the liver on this
un-enhanced exam. Gallbladder appears normal.

Pancreas: No mass or inflammatory process identified on this
un-enhanced exam.

Spleen: Within normal limits in size.

Adrenals/Urinary Tract: Left renal cyst measures 6.7 x 5.7 cm. Right
renal cyst measures 5.4 x 4.6 cm. Kidneys otherwise unremarkable
without stone or hydronephrosis.

No ureteral or bladder calculi identified, although the distal
ureters and a portion of the bladder are obscured by metallic
artifact emanating from patient's bilateral hip replacements.
Bladder is moderately distended but otherwise unremarkable.

Stomach/Bowel: No significantly dilated large or small bowel loops
appreciated. Several fluid-filled small bowel loops in the right
lower quadrant are upper normal in caliber. Nonspecific air-fluid
levels within the right lower quadrant small bowel could indicate
underlying enteritis and/or ileus. No evidence of bowel wall
inflammation seen.

Vascular/Lymphatic: Scattered atherosclerotic changes of the normal
caliber abdominal aorta. No enlarged lymph nodes appreciated in the
abdomen or pelvis.

Reproductive: No mass or other significant abnormality.

Other: No free fluid or abscess collection identified. No free
intraperitoneal air.

Musculoskeletal: Degenerative changes throughout the scoliotic
thoracolumbar spine, moderate in degree. No acute or suspicious
osseous lesion. Bilateral hip replacements.

Bilateral inguinal hernias which contain fat only. Superficial soft
tissues are otherwise unremarkable.
IMPRESSION: 1. Fluid filled small bowel loops in the right lower quadrant, with
associated nonspecific air-fluid levels. This could represent
enteritis and/or mild ileus. Recommend follow-up plain film
examination in 12-24 hours to exclude the less likely possibility of
early developing small bowel obstruction.
2. No evidence of intra-abdominal or intrapelvic hemorrhage. No free
fluid.
3. Bladder is moderately distended.
4. Aortic atherosclerosis. Additional chronic/incidental findings
detailed above.
These results will be called to the ordering clinician or
representative by the Radiologist Assistant, and communication
documented in the PACS or zVision Dashboard.

## 2016-12-31 ENCOUNTER — Other Ambulatory Visit: Payer: Self-pay | Admitting: Family Medicine

## 2017-01-03 ENCOUNTER — Other Ambulatory Visit: Payer: Self-pay | Admitting: Family Medicine

## 2017-01-07 ENCOUNTER — Ambulatory Visit: Payer: Medicare Other | Admitting: Family Medicine

## 2017-01-10 ENCOUNTER — Ambulatory Visit: Payer: Medicare Other | Admitting: Family Medicine

## 2017-01-10 ENCOUNTER — Other Ambulatory Visit: Payer: Self-pay | Admitting: Family Medicine

## 2017-01-15 ENCOUNTER — Encounter: Payer: Self-pay | Admitting: Family Medicine

## 2017-01-15 ENCOUNTER — Ambulatory Visit (INDEPENDENT_AMBULATORY_CARE_PROVIDER_SITE_OTHER): Payer: Medicare Other | Admitting: Family Medicine

## 2017-01-15 VITALS — BP 156/82 | HR 66 | Temp 98.3°F | Resp 16 | Ht 61.0 in | Wt 168.0 lb

## 2017-01-15 DIAGNOSIS — D492 Neoplasm of unspecified behavior of bone, soft tissue, and skin: Secondary | ICD-10-CM

## 2017-01-15 NOTE — Progress Notes (Signed)
Subjective:    Patient ID: [name], female    DOB: Apr 05, 1932, 83 y.o.   MRN: [id number]  HPI There is a suspicious lesion just developed on her nasal bridge that has been growing for the last 2 or 3 weeks.  It is approximately 5 mm in matter.  It is a hard, hyperkeratotic, scaly papule that almost appears to be a cutaneous horn.  It is on the left side of the nasal bridge.  It does not hurt.  It is not bleeding. Past Medical History:  Diagnosis Date  . CKD (chronic kidney disease) stage 3, GFR 30-59 ml/min (HCC)   . Hypercholesterolemia   . Hypertension   . Mild acid reflux   . Mitral valve prolapse   . Renal insufficiency   . Spinal stenosis   . Staph infection     history of a wound infection following a previous right hip surgery  . Upper GI bleed    duodenal ulcer (09/2015) s/p endo clipping   Past Surgical History:  Procedure Laterality Date  . ABCESS DRAINAGE    . ABDOMINAL HYSTERECTOMY    . COLONOSCOPY  04/30/01   ZES:[id number] hemorrhoids, otherwise, normal rectum/colon/Normal terminal ileum  . COLONOSCOPY  03/02/2007   AUQ:JFHL sigmoid colon diverticula.  Otherwise no polyps, masses/Normal terminal ileum.  Approximately 10-15 cm of the distal terminal ileum visualized/normal view of the rectum  . ESOPHAGOGASTRODUODENOSCOPY    03/01/2007   KTG:[id number] esophagus without evidence of Barrett's, mass/Mild erythema in the antrum.  Distorted pylorus consistent with prior ulcer disease.  Biopsies obtained/ 3. Junction of D1 and D2 erythematous, edematous, and strictured.  The lumen was narrowed to approximately 10 mm.   . ESOPHAGOGASTRODUODENOSCOPY N/A 09/09/2015   Procedure: ESOPHAGOGASTRODUODENOSCOPY (EGD);  Surgeon: Jerene Bears, MD;  Location: [facility] ENDOSCOPY;  Service: Endoscopy;  Laterality: N/A;  . Left total hip replacement arthroplasty  June of 2001  . Right breast lumpectomy    . Right total hip replacement arthroplasty  August of 2001  . TOTAL KNEE ARTHROPLASTY      Current Outpatient Prescriptions on File Prior to Visit  Medication Sig Dispense Refill  . albuterol (PROVENTIL HFA;VENTOLIN HFA) 108 (90 BASE) MCG/ACT inhaler Inhale 2 puffs into the lungs every 4 (four) hours as needed for wheezing or shortness of breath. 1 Inhaler 0  . albuterol (PROVENTIL) (2.5 MG/3ML) 0.083% nebulizer solution Take 3 mLs (2.5 mg total) by nebulization every 6 (six) hours as needed for wheezing or shortness of breath. 150 mL 0  . amLODipine (NORVASC) 10 MG tablet TAKE 1 TABLET BY MOUTH DAILY 90 tablet 3  . atorvastatin (LIPITOR) 20 MG tablet TAKE 1 TABLET BY MOUTH DAILY 90 tablet 2  . Biotin 2500 MCG CAPS Take 2,500 mg by mouth daily.    . butalbital-aspirin-caffeine (FIORINAL) 50-325-40 MG per capsule TAKE ONE CAPSULE BY MOUTH EVERY 8 HOURS AS NEEDED FOR HEADACHE 30 capsule 0  . citalopram (CELEXA) 20 MG tablet TAKE 1 TABLET DAILY 90 tablet 3  . donepezil (ARICEPT) 10 MG tablet Take 1 tablet (10 mg total) by mouth at bedtime. 90 tablet 3  . ferrous sulfate 325 (65 FE) MG tablet Take 325 mg by mouth 2 (two) times daily with a meal.    . gabapentin (NEURONTIN) 300 MG capsule Take 1 capsule (300 mg total) by mouth 3 (three) times daily. 90 capsule 5  . HYDROcodone-acetaminophen (NORCO/VICODIN) 5-325 MG tablet Take 0.5-1 tablets by mouth every 6 (six) hours as needed for  moderate pain. 60 tablet 0  . memantine (NAMENDA) 10 MG tablet TAKE 1 TABLET BY MOUTH TWICE A DAY 180 tablet 3  . metoprolol tartrate (LOPRESSOR) 25 MG tablet TAKE 1 TABLET BY MOUTH TWICE A DAY 60 tablet 3  . Multiple Vitamin (MULTIVITAMIN WITH MINERALS) TABS tablet Take 1 tablet by mouth daily.    . pantoprazole (PROTONIX) 40 MG tablet TAKE 1 TABLET BY MOUTH DAILY 90 tablet 0  . vitamin B-12 (CYANOCOBALAMIN) 500 MCG tablet Take 500 mcg by mouth daily.     No current facility-administered medications on file prior to visit.    Allergies  Allergen Reactions  . Demerol [Meperidine] Other (See Comments)     Altered mental status  . Meperidine Hcl Other (See Comments)    Altered mental status   Social History   Social History  . Marital status: Widowed    Spouse name: N/A  . Number of children: N/A  . Years of education: N/A   Occupational History  . Not on file.   Social History Main Topics  . Smoking status: Never Smoker  . Smokeless tobacco: Never Used  . Alcohol use No  . Drug use: No  . Sexual activity: No     Comment: widowed   Other Topics Concern  . Not on file   Social History Narrative  . No narrative on file      Review of Systems  All other systems reviewed and are negative.      Objective:   Physical Exam  HENT:  Nose:    Cardiovascular: Normal rate, regular rhythm and normal heart sounds.   Pulmonary/Chest: Effort normal and breath sounds normal. No respiratory distress. She has no wheezes. She has no rales.  Vitals reviewed.         Assessment & Plan:  Neoplasm of skin of nose  I am uncertain of the pathology of this.  Could represent squamous cell carcinoma versus an irritated seborrheic keratosis versus cutaneous horn.  Patient elects to try cryotherapy using liquid nitrogen.  This was performed for a total of 30-40 seconds in the office.  Wound care was discussed.  If lesion persists greater than 3 weeks, I would recommend dermatology consult given the location for an excisional biopsy

## 2017-01-15 NOTE — Progress Notes (Signed)
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 is I will try freezing it for recent neck is if it comes back and he is swelling I would recommend versus location exactly patient is here Skin right excision control once and states as far as 06 is 50-50 within 2-3 weeks if it falls off and goes away in his home so he could take up to 2 weeks for follow-up to tell me to keep to 3 weeks and then wait 2-3weeks is blister blister is normal is a really pleasant a small fluid was so is not of a will keep it covered excuse me this morning is right can have presented to is to be right back                                                                                              Is be

## 2017-01-28 ENCOUNTER — Other Ambulatory Visit: Payer: Self-pay | Admitting: Family Medicine

## 2017-02-10 ENCOUNTER — Other Ambulatory Visit: Payer: Self-pay | Admitting: Family Medicine

## 2017-02-11 NOTE — Telephone Encounter (Signed)
Medication refill for one time only.  Patient needs to be seen.  Letter sent for patient to call and schedule.  Has been over 1 year since routine OV and any blood work.

## 2017-02-18 ENCOUNTER — Other Ambulatory Visit: Payer: Self-pay | Admitting: Family Medicine

## 2017-04-02 ENCOUNTER — Other Ambulatory Visit: Payer: Self-pay | Admitting: Family Medicine

## 2017-05-02 ENCOUNTER — Ambulatory Visit (INDEPENDENT_AMBULATORY_CARE_PROVIDER_SITE_OTHER): Payer: Medicare Other | Admitting: Family Medicine

## 2017-05-02 ENCOUNTER — Encounter: Payer: Self-pay | Admitting: Family Medicine

## 2017-05-02 VITALS — BP 130/72 | HR 74 | Temp 98.1°F | Resp 16 | Ht 61.0 in | Wt 172.0 lb

## 2017-05-02 DIAGNOSIS — L732 Hidradenitis suppurativa: Secondary | ICD-10-CM | POA: Diagnosis not present

## 2017-05-02 MED ORDER — DOXYCYCLINE HYCLATE 100 MG PO TABS: 100.0000 mg | ORAL_TABLET | Freq: Two times a day (BID) | ORAL | 0 refills | Status: DC

## 2017-05-02 NOTE — Progress Notes (Signed)
Subjective:    Patient ID: [name], female    DOB: 01/14/1933, 84 y.o.   MRN: [id number]  HPI Patient presents today with 2 "boils" in her left axilla.  One is 1.5 cm in diameter erythematous and tender and swollen and painful.  The other one is small and less than 6 mm in diameter.  Both are fluctuant erythematous and tender to the touch.  She previously had a boil like this in her left axilla about a year ago. Past Medical History:  Diagnosis Date  . CKD (chronic kidney disease) stage 3, GFR 30-59 ml/min (HCC)   . Hypercholesterolemia   . Hypertension   . Mild acid reflux   . Mitral valve prolapse   . Renal insufficiency   . Spinal stenosis   . Staph infection     history of a wound infection following a previous right hip surgery  . Upper GI bleed    duodenal ulcer (09/2015) s/p endo clipping   Past Surgical History:  Procedure Laterality Date  . ABCESS DRAINAGE    . ABDOMINAL HYSTERECTOMY    . COLONOSCOPY  04/30/01   SWF:[id number] hemorrhoids, otherwise, normal rectum/colon/Normal terminal ileum  . COLONOSCOPY  03/02/2007   DUK:GURK sigmoid colon diverticula.  Otherwise no polyps, masses/Normal terminal ileum.  Approximately 10-15 cm of the distal terminal ileum visualized/normal view of the rectum  . ESOPHAGOGASTRODUODENOSCOPY    03/01/2007   YHC:WCBJSE esophagus without evidence of Barrett's, mass/Mild erythema in the antrum.  Distorted pylorus consistent with prior ulcer disease.  Biopsies obtained/ 3. Junction of D1 and D2 erythematous, edematous, and strictured.  The lumen was narrowed to approximately 10 mm.   . ESOPHAGOGASTRODUODENOSCOPY N/A 09/09/2015   Procedure: ESOPHAGOGASTRODUODENOSCOPY (EGD);  Surgeon: Jerene Bears, MD;  Location: [facility] ENDOSCOPY;  Service: Endoscopy;  Laterality: N/A;  . Left total hip replacement arthroplasty  June of 2001  . Right breast lumpectomy    . Right total hip replacement arthroplasty  August of 2001  . TOTAL KNEE ARTHROPLASTY      Current Outpatient Medications on File Prior to Visit  Medication Sig Dispense Refill  . albuterol (PROVENTIL HFA;VENTOLIN HFA) 108 (90 BASE) MCG/ACT inhaler Inhale 2 puffs into the lungs every 4 (four) hours as needed for wheezing or shortness of breath. 1 Inhaler 0  . albuterol (PROVENTIL) (2.5 MG/3ML) 0.083% nebulizer solution Take 3 mLs (2.5 mg total) by nebulization every 6 (six) hours as needed for wheezing or shortness of breath. 150 mL 0  . amLODipine (NORVASC) 10 MG tablet TAKE 1 TABLET BY MOUTH DAILY 90 tablet 3  . atorvastatin (LIPITOR) 20 MG tablet TAKE 1 TABLET BY MOUTH DAILY 90 tablet 2  . Biotin 2500 MCG CAPS Take 2,500 mg by mouth daily.    . butalbital-aspirin-caffeine (FIORINAL) 50-325-40 MG per capsule TAKE ONE CAPSULE BY MOUTH EVERY 8 HOURS AS NEEDED FOR HEADACHE 30 capsule 0  . citalopram (CELEXA) 20 MG tablet TAKE 1 TABLET DAILY 90 tablet 3  . donepezil (ARICEPT) 10 MG tablet TAKE ONE TABLET BY MOUTH EVERY NIGHT AT BEDTIME 90 tablet 3  . ferrous sulfate 325 (65 FE) MG tablet Take 325 mg by mouth 2 (two) times daily with a meal.    . gabapentin (NEURONTIN) 300 MG capsule Take 1 capsule (300 mg total) by mouth 3 (three) times daily. 90 capsule 5  . HYDROcodone-acetaminophen (NORCO/VICODIN) 5-325 MG tablet Take 0.5-1 tablets by mouth every 6 (six) hours as needed for moderate pain. 60 tablet 0  .  memantine (NAMENDA) 10 MG tablet TAKE 1 TABLET BY MOUTH TWICE A DAY 180 tablet 3  . metoprolol tartrate (LOPRESSOR) 25 MG tablet TAKE 1 TABLET BY MOUTH TWICE A DAY 60 tablet 0  . Multiple Vitamin (MULTIVITAMIN WITH MINERALS) TABS tablet Take 1 tablet by mouth daily.    . pantoprazole (PROTONIX) 40 MG tablet TAKE 1 TABLET BY MOUTH DAILY 90 tablet 0  . vitamin B-12 (CYANOCOBALAMIN) 500 MCG tablet Take 500 mcg by mouth daily.     No current facility-administered medications on file prior to visit.    Allergies  Allergen Reactions  . Demerol [Meperidine] Other (See Comments)     Altered mental status  . Meperidine Hcl Other (See Comments)    Altered mental status   Social History   Socioeconomic History  . Marital status: Widowed    Spouse name: Not on file  . Number of children: Not on file  . Years of education: Not on file  . Highest education level: Not on file  Social Needs  . Financial resource strain: Not on file  . Food insecurity - worry: Not on file  . Food insecurity - inability: Not on file  . Transportation needs - medical: Not on file  . Transportation needs - non-medical: Not on file  Occupational History  . Not on file  Tobacco Use  . Smoking status: Never Smoker  . Smokeless tobacco: Never Used  Substance and Sexual Activity  . Alcohol use: No  . Drug use: No  . Sexual activity: No    Comment: widowed  Other Topics Concern  . Not on file  Social History Narrative  . Not on file      Review of Systems  All other systems reviewed and are negative.      Objective:   Physical Exam  Cardiovascular: Normal rate, regular rhythm and normal heart sounds.  Pulmonary/Chest: Effort normal and breath sounds normal.    Vitals reviewed.  1.5 cm large fluctuant red tender nodule.  6 mm red tender nodule as diagrammed       Assessment & Plan:  Hidradenitis suppurativa of left axilla  Patient has previously had similar lesions in the left axilla and also lesions in her pubic area.  I believe she has very mild hidradenitis suppurtiva.  The large 1.5 cm nodule was anesthetized with 0.1% lidocaine with epinephrine.  A 1/2 cm horizontal incision was made.  Only bloody drainage came from the opening.  The wound was opened with a pair hemostats and probed with Q-tip soaked in peroxide.  There was no significant drainage.  The wound was then packed with 1.5 inches of 1/4 inch iodoform gauze and wound care was discussed.  Begin doxycycline 100 mg p.o. twice daily for 2 weeks.  If these lesions continue, we may need to discuss topical  clindamycin versus a prolonged course of doxycycline

## 2017-06-14 ENCOUNTER — Other Ambulatory Visit: Payer: Self-pay | Admitting: Family Medicine

## 2017-06-14 MED ORDER — METOPROLOL TARTRATE 25 MG PO TABS: 25.0000 mg | ORAL_TABLET | Freq: Two times a day (BID) | ORAL | 3 refills | Status: DC

## 2017-07-22 ENCOUNTER — Telehealth: Payer: Self-pay | Admitting: Family Medicine

## 2017-07-22 MED ORDER — ATORVASTATIN CALCIUM 20 MG PO TABS: 20.0000 mg | ORAL_TABLET | Freq: Every day | ORAL | 2 refills | Status: DC

## 2017-07-22 MED ORDER — PANTOPRAZOLE SODIUM 40 MG PO TBEC: 40.0000 mg | DELAYED_RELEASE_TABLET | Freq: Every day | ORAL | 3 refills | Status: DC

## 2017-07-22 NOTE — Telephone Encounter (Signed)
Pt needs refillon lipitor and protonix, sent to [facility].

## 2017-07-22 NOTE — Telephone Encounter (Signed)
Medication called/sent to requested pharmacy  

## 2017-07-25 ENCOUNTER — Ambulatory Visit: Payer: Medicare Other | Admitting: Family Medicine

## 2017-07-26 ENCOUNTER — Ambulatory Visit: Payer: Medicare Other | Admitting: Family Medicine

## 2017-09-04 ENCOUNTER — Other Ambulatory Visit: Payer: Self-pay | Admitting: Family Medicine

## 2017-09-04 MED ORDER — GABAPENTIN 300 MG PO CAPS: 300.0000 mg | ORAL_CAPSULE | Freq: Three times a day (TID) | ORAL | 3 refills | Status: DC

## 2017-11-15 IMAGING — CR DG CHEST 2V
2 series · 2 of 2 positions shown · non-contrast
Comparison: Chest radiograph September 08, 2015

CLINICAL DATA: Cough and congestion for 2 weeks, assess for
pneumonia. History of aspiration pneumonia.

EXAM:
CHEST  2 VIEW

[w chest pa]
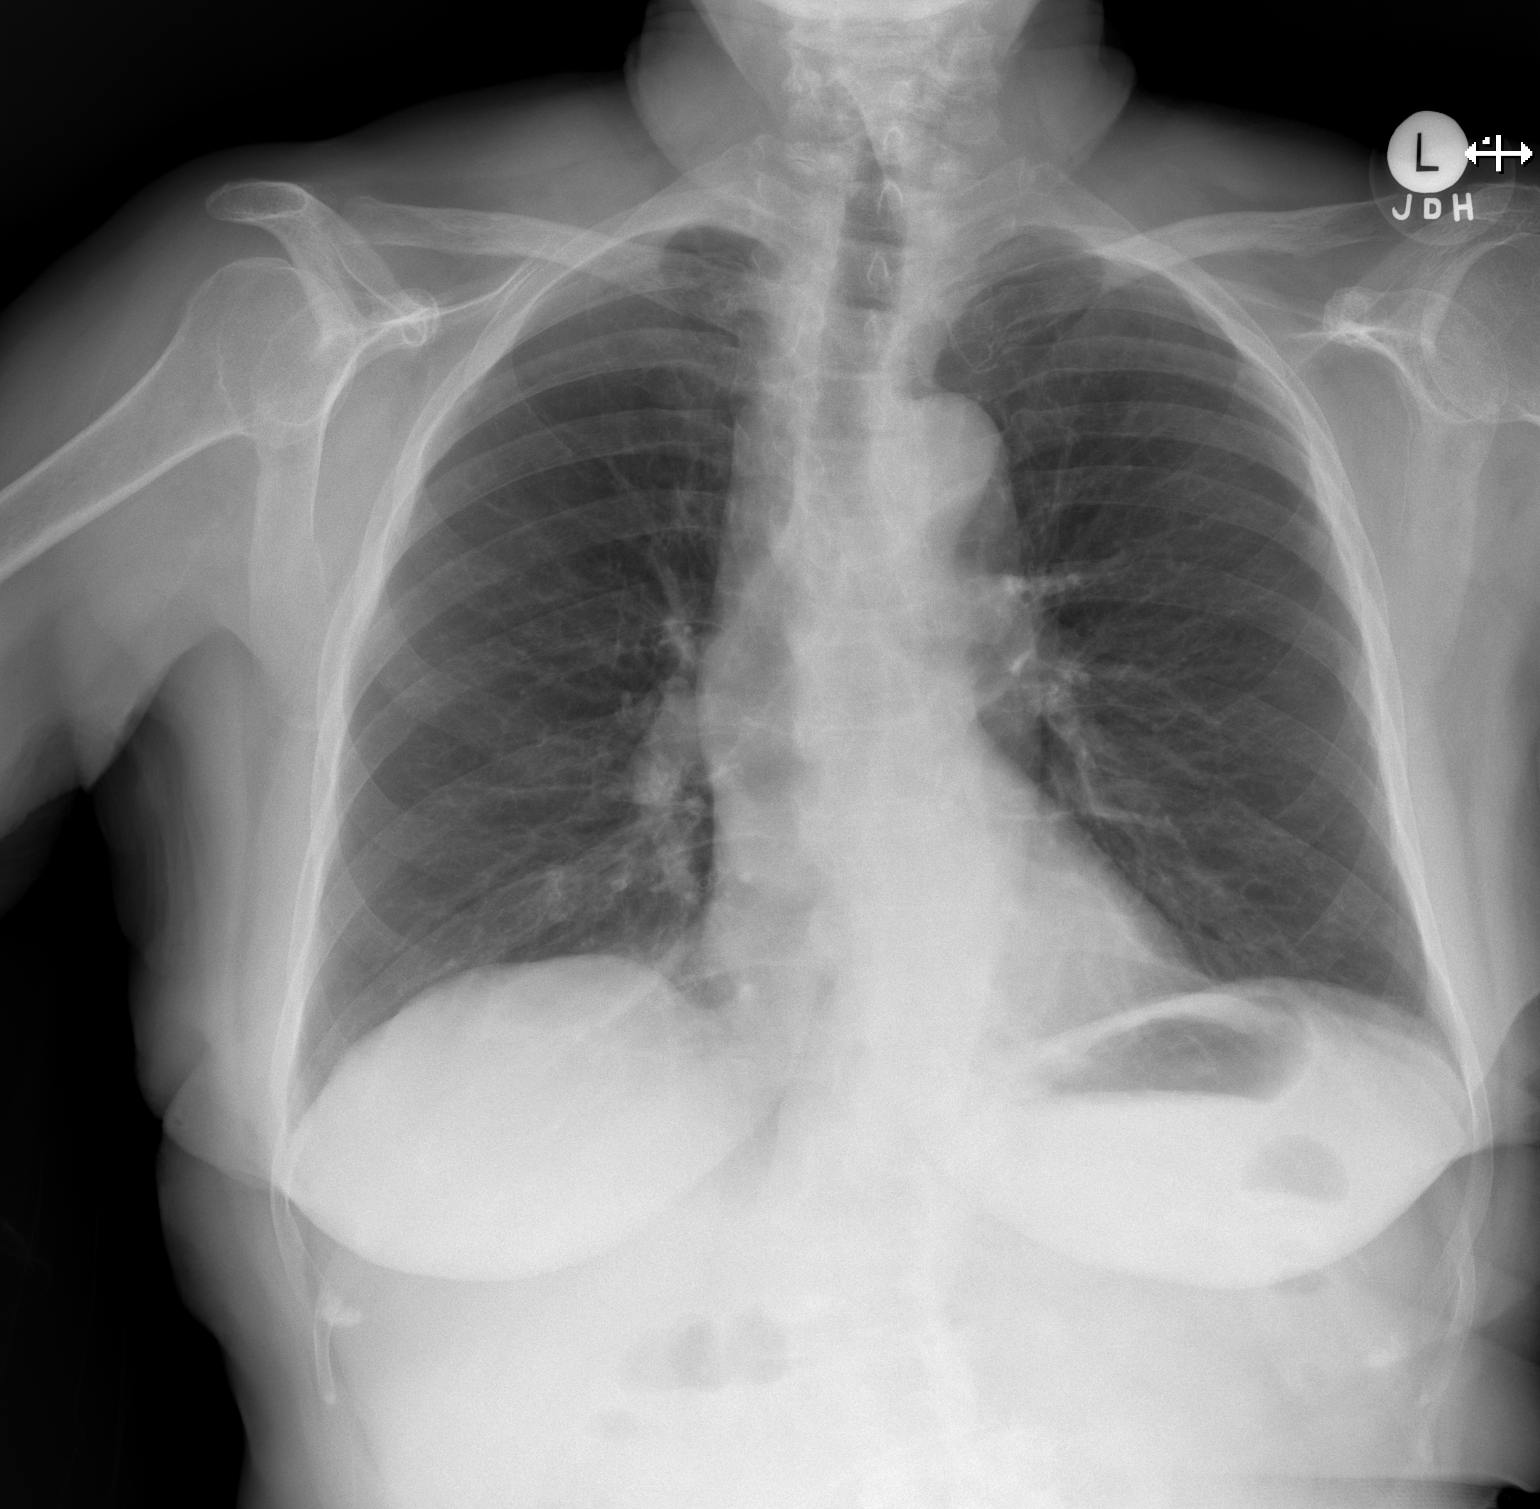

[w chest lat]
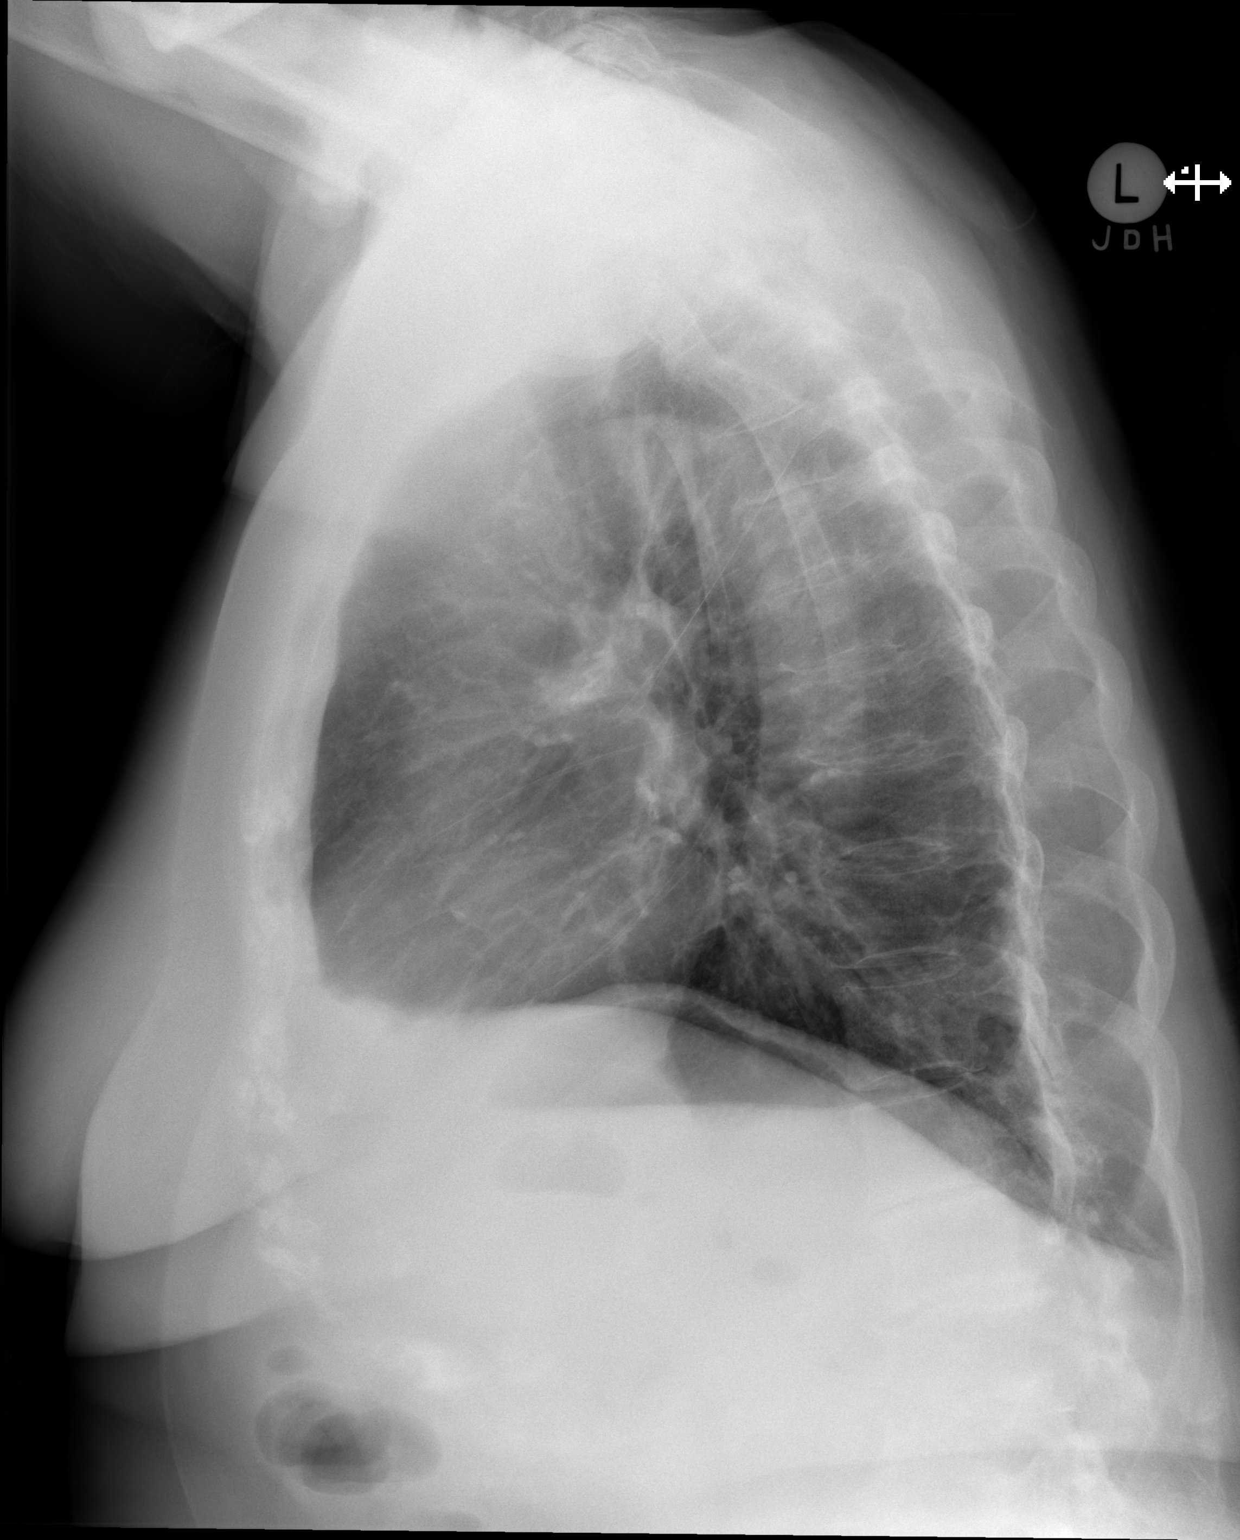

[2 of 2 positions shown; findings below may reference images not displayed]

FINDINGS: Cardiomediastinal silhouette is normal. Similar strandy densities
RIGHT lung base without pleural effusion. No pneumothorax. Moderate
degenerative change of the spine, scoliosis.
IMPRESSION: Similar examination: RIGHT lung base atelectasis, scarring or
recurrent pneumonia.

## 2017-11-27 ENCOUNTER — Other Ambulatory Visit: Payer: Self-pay | Admitting: Family Medicine

## 2018-01-01 ENCOUNTER — Telehealth: Payer: Self-pay | Admitting: Family Medicine

## 2018-01-01 NOTE — Telephone Encounter (Signed)
Patient's daughter called [facility] stating that with her back and hip pain she wanted to know if she could take Aleve back pain relief?   CB# [phone]

## 2018-01-02 ENCOUNTER — Other Ambulatory Visit: Payer: Self-pay | Admitting: Family Medicine

## 2018-01-02 MED ORDER — TRAMADOL HCL 50 MG PO TABS: 50.0000 mg | ORAL_TABLET | Freq: Three times a day (TID) | ORAL | 0 refills | Status: DC | PRN

## 2018-01-02 NOTE — Telephone Encounter (Signed)
Pt's daughter called back and would like for her to have some Tramadol. Please send to pharm.

## 2018-01-02 NOTE — Telephone Encounter (Signed)
I would not given her history of upper gi bleed and ckd.  She could use tramadol.

## 2018-01-02 NOTE — Telephone Encounter (Signed)
Pt's daughter aware via vm and instructed to call if she would like the Tramadol.

## 2018-01-02 NOTE — Telephone Encounter (Signed)
Tramadol was sent to pharmacy, do not take with hydrocodone (on her med list but I don't think she is taking).

## 2018-01-02 NOTE — Telephone Encounter (Signed)
Daughter aware and pt is no longer taking hydrocodone - med list updated.

## 2018-01-11 ENCOUNTER — Other Ambulatory Visit: Payer: Self-pay | Admitting: Family Medicine

## 2018-02-09 ENCOUNTER — Other Ambulatory Visit: Payer: Self-pay | Admitting: Family Medicine

## 2018-04-10 ENCOUNTER — Other Ambulatory Visit: Payer: Self-pay | Admitting: Family Medicine

## 2018-07-02 ENCOUNTER — Telehealth: Payer: Self-pay | Admitting: Family Medicine

## 2018-07-02 MED ORDER — METOPROLOL TARTRATE 25 MG PO TABS: 25.0000 mg | ORAL_TABLET | Freq: Two times a day (BID) | ORAL | 3 refills | Status: DC

## 2018-07-02 NOTE — Telephone Encounter (Signed)
Medication called/sent to requested pharmacy  

## 2018-07-02 NOTE — Telephone Encounter (Signed)
Patient needs refill on metoprolol  Send to [facility]

## 2018-07-09 ENCOUNTER — Other Ambulatory Visit: Payer: Self-pay | Admitting: Family Medicine

## 2018-07-09 MED ORDER — PANTOPRAZOLE SODIUM 40 MG PO TBEC: 40.0000 mg | DELAYED_RELEASE_TABLET | Freq: Every day | ORAL | 3 refills | Status: DC

## 2018-07-23 ENCOUNTER — Other Ambulatory Visit: Payer: Self-pay | Admitting: Family Medicine

## 2018-07-23 MED ORDER — MEMANTINE HCL 10 MG PO TABS: 10.0000 mg | ORAL_TABLET | Freq: Two times a day (BID) | ORAL | 3 refills | Status: DC

## 2018-09-11 ENCOUNTER — Other Ambulatory Visit: Payer: Self-pay | Admitting: Family Medicine

## 2018-09-12 ENCOUNTER — Other Ambulatory Visit: Payer: Self-pay | Admitting: Family Medicine

## 2018-11-17 ENCOUNTER — Encounter: Payer: Self-pay | Admitting: Family Medicine

## 2018-11-17 ENCOUNTER — Other Ambulatory Visit: Payer: Self-pay

## 2018-11-17 ENCOUNTER — Ambulatory Visit (INDEPENDENT_AMBULATORY_CARE_PROVIDER_SITE_OTHER): Payer: Medicare Other | Admitting: Family Medicine

## 2018-11-17 VITALS — BP 130/70 | HR 66 | Temp 98.6°F | Resp 16 | Ht 61.0 in | Wt 175.0 lb

## 2018-11-17 DIAGNOSIS — H6123 Impacted cerumen, bilateral: Secondary | ICD-10-CM | POA: Diagnosis not present

## 2018-11-17 NOTE — Progress Notes (Signed)
Subjective:    Patient ID: [name], female    DOB: [birth year], 85 y.o.   MRN: [id number]  HPI  Patient's daughter recently noticed that her mother was having increasing trouble hearing people.  Therefore she took her to her audiologist to have her hearing aids evaluated.  At the audiologist office they discovered that she had bilateral cerumen impactions.  She is here today to have these removed.  She denies any otalgia.  She denies any headache or sinus pain.  She denies any vertigo.  Examination shows bilateral cerumen impactions. Past Medical History:  Diagnosis Date  . CKD (chronic kidney disease) stage 3, GFR 30-59 ml/min (HCC)   . Hypercholesterolemia   . Hypertension   . Mild acid reflux   . Mitral valve prolapse   . Renal insufficiency   . Spinal stenosis   . Staph infection     history of a wound infection following a previous right hip surgery  . Upper GI bleed    duodenal ulcer (09/2015) s/p endo clipping   Past Surgical History:  Procedure Laterality Date  . ABCESS DRAINAGE    . ABDOMINAL HYSTERECTOMY    . COLONOSCOPY  04/30/01   [id number] hemorrhoids, otherwise, normal rectum/colon/Normal terminal ileum  . COLONOSCOPY  03/02/2007   [id number] sigmoid colon diverticula.  Otherwise no polyps, masses/Normal terminal ileum.  Approximately 10-15 cm of the distal terminal ileum visualized/normal view of the rectum  . ESOPHAGOGASTRODUODENOSCOPY    03/01/2007   [id number] esophagus without evidence of Barrett's, mass/Mild erythema in the antrum.  Distorted pylorus consistent with prior ulcer disease.  Biopsies obtained/ 3. Junction of D1 and D2 erythematous, edematous, and strictured.  The lumen was narrowed to approximately 10 mm.   . ESOPHAGOGASTRODUODENOSCOPY N/A 09/09/2015   Procedure: ESOPHAGOGASTRODUODENOSCOPY (EGD);  Surgeon: Jerene Bears, MD;  Location: [facility] ENDOSCOPY;  Service: Endoscopy;  Laterality: N/A;  . Left total hip replacement arthroplasty  June of 2001   . Right breast lumpectomy    . Right total hip replacement arthroplasty  August of 2001  . TOTAL KNEE ARTHROPLASTY     Current Outpatient Medications on File Prior to Visit  Medication Sig Dispense Refill  . albuterol (PROVENTIL HFA;VENTOLIN HFA) 108 (90 BASE) MCG/ACT inhaler Inhale 2 puffs into the lungs every 4 (four) hours as needed for wheezing or shortness of breath. 1 Inhaler 0  . albuterol (PROVENTIL) (2.5 MG/3ML) 0.083% nebulizer solution Take 3 mLs (2.5 mg total) by nebulization every 6 (six) hours as needed for wheezing or shortness of breath. 150 mL 0  . amLODipine (NORVASC) 10 MG tablet TAKE 1 TABLET BY MOUTH DAILY 90 tablet 1  . atorvastatin (LIPITOR) 20 MG tablet TAKE 1 TABLET BY MOUTH EVERY DAY 90 tablet 2  . Biotin 2500 MCG CAPS Take 2,500 mg by mouth daily.    . butalbital-aspirin-caffeine (FIORINAL) 50-325-40 MG per capsule TAKE ONE CAPSULE BY MOUTH EVERY 8 HOURS AS NEEDED FOR HEADACHE 30 capsule 0  . citalopram (CELEXA) 20 MG tablet TAKE 1 TABLET DAILY 90 tablet 3  . donepezil (ARICEPT) 10 MG tablet TAKE ONE TABLET BY MOUTH EVERY NIGHT AT BEDTIME 90 tablet 3  . doxycycline (VIBRA-TABS) 100 MG tablet Take 1 tablet (100 mg total) by mouth 2 (two) times daily. 28 tablet 0  . ferrous sulfate 325 (65 FE) MG tablet Take 325 mg by mouth 2 (two) times daily with a meal.    . gabapentin (NEURONTIN) 300 MG capsule TAKE 1 CAPSULE BY MOUTH  THREE TIMES A DAY 270 capsule 3  . memantine (NAMENDA) 10 MG tablet Take 1 tablet (10 mg total) by mouth 2 (two) times daily. 180 tablet 3  . metoprolol tartrate (LOPRESSOR) 25 MG tablet Take 1 tablet (25 mg total) by mouth 2 (two) times daily. 180 tablet 3  . Multiple Vitamin (MULTIVITAMIN WITH MINERALS) TABS tablet Take 1 tablet by mouth daily.    . pantoprazole (PROTONIX) 40 MG tablet Take 1 tablet (40 mg total) by mouth daily. 90 tablet 3  . traMADol (ULTRAM) 50 MG tablet Take 1 tablet (50 mg total) by mouth every 8 (eight) hours as needed for  severe pain. Do not take with hydrocodone 30 tablet 0  . vitamin B-12 (CYANOCOBALAMIN) 500 MCG tablet Take 500 mcg by mouth daily.     No current facility-administered medications on file prior to visit.    Allergies  Allergen Reactions  . Demerol [Meperidine] Other (See Comments)    Altered mental status  . Meperidine Hcl Other (See Comments)    Altered mental status   Social History   Socioeconomic History  . Marital status: Widowed    Spouse name: Not on file  . Number of children: Not on file  . Years of education: Not on file  . Highest education level: Not on file  Occupational History  . Not on file  Social Needs  . Financial resource strain: Not on file  . Food insecurity    Worry: Not on file    Inability: Not on file  . Transportation needs    Medical: Not on file    Non-medical: Not on file  Tobacco Use  . Smoking status: Never Smoker  . Smokeless tobacco: Never Used  Substance and Sexual Activity  . Alcohol use: No  . Drug use: No  . Sexual activity: Never    Comment: widowed  Lifestyle  . Physical activity    Days per week: Not on file    Minutes per session: Not on file  . Stress: Not on file  Relationships  . Social Herbalist on phone: Not on file    Gets together: Not on file    Attends religious service: Not on file    Active member of club or organization: Not on file    Attends meetings of clubs or organizations: Not on file    Relationship status: Not on file  . Intimate partner violence    Fear of current or ex partner: Not on file    Emotionally abused: Not on file    Physically abused: Not on file    Forced sexual activity: Not on file  Other Topics Concern  . Not on file  Social History Narrative  . Not on file     Review of Systems  All other systems reviewed and are negative.      Objective:   Physical Exam Constitutional:      Appearance: Normal appearance. She is normal weight.  HENT:     Right Ear: There  is impacted cerumen.     Left Ear: There is impacted cerumen.  Cardiovascular:     Rate and Rhythm: Normal rate and regular rhythm.  Pulmonary:     Effort: Pulmonary effort is normal.     Breath sounds: Normal breath sounds.  Neurological:     Mental Status: She is alert.           Assessment & Plan:  Bilateral hearing loss due to  cerumen impaction  Bilateral cerumen impactions were removed easily using irrigation and lavage.  Patient tolerated the procedure well without complication.  Examination of the auditory canals afterwards showed normal healthy-appearing tympanic membranes with normal landmarks and no evidence of middle ear effusion or infection.  We discussed strategies to help prevent this in the future and also recommended Debrox drops over-the-counter in the future if this occurs again to help prevent them from having to come to the doctor's office.  They can do that twice a day for 5 days prior to arrival to see if that would help.

## 2018-12-08 ENCOUNTER — Other Ambulatory Visit: Payer: Self-pay | Admitting: Family Medicine

## 2018-12-08 NOTE — Telephone Encounter (Signed)
Requesting refill    Tramadol  LOV:  11/17/18  LRF:  01/02/18

## 2018-12-09 MED ORDER — TRAMADOL HCL 50 MG PO TABS: 50.0000 mg | ORAL_TABLET | Freq: Three times a day (TID) | ORAL | 0 refills | Status: DC | PRN

## 2019-01-18 ENCOUNTER — Other Ambulatory Visit: Payer: Self-pay | Admitting: Family Medicine

## 2019-02-03 ENCOUNTER — Other Ambulatory Visit: Payer: Self-pay | Admitting: Family Medicine

## 2019-03-20 ENCOUNTER — Other Ambulatory Visit: Payer: Self-pay | Admitting: Family Medicine

## 2019-06-30 ENCOUNTER — Other Ambulatory Visit: Payer: Self-pay | Admitting: Family Medicine

## 2019-07-24 ENCOUNTER — Ambulatory Visit (INDEPENDENT_AMBULATORY_CARE_PROVIDER_SITE_OTHER): Payer: Medicare PPO | Admitting: Family Medicine

## 2019-07-24 ENCOUNTER — Other Ambulatory Visit: Payer: Self-pay

## 2019-07-24 VITALS — BP 130/80 | HR 68 | Temp 96.4°F | Resp 16 | Ht 61.0 in | Wt 177.0 lb

## 2019-07-24 DIAGNOSIS — M25562 Pain in left knee: Secondary | ICD-10-CM

## 2019-07-24 DIAGNOSIS — N183 Chronic kidney disease, stage 3 unspecified: Secondary | ICD-10-CM

## 2019-07-24 NOTE — Progress Notes (Signed)
Subjective:    Patient ID: [name], female    DOB: [birth year], 86 y.o.   MRN: [id number]  HPI  Patient has moderate dementia.  She presents today with her daughter complaining of pain in her left knee.  It hurts to walk.  It hurts to stand.  It hurts to go up and down steps.  She denies any falls or injuries.  There is no erythema or warmth in the left knee.  There is no significant effusion.  There is no bruising.  She does have crepitus with range of motion and tenderness over the lateral joint line posteriorly.  I suspect osteoarthritis.  She cannot take NSAIDs due to chronic kidney disease. Past Medical History:  Diagnosis Date  . CKD (chronic kidney disease) stage 3, GFR 30-59 ml/min (HCC)   . Hypercholesterolemia   . Hypertension   . Mild acid reflux   . Mitral valve prolapse   . Renal insufficiency   . Spinal stenosis   . Staph infection     history of a wound infection following a previous right hip surgery  . Upper GI bleed    duodenal ulcer (09/2015) s/p endo clipping   Past Surgical History:  Procedure Laterality Date  . ABCESS DRAINAGE    . ABDOMINAL HYSTERECTOMY    . COLONOSCOPY  04/30/01   [id number] hemorrhoids, otherwise, normal rectum/colon/Normal terminal ileum  . COLONOSCOPY  03/02/2007   [id number] sigmoid colon diverticula.  Otherwise no polyps, masses/Normal terminal ileum.  Approximately 10-15 cm of the distal terminal ileum visualized/normal view of the rectum  . ESOPHAGOGASTRODUODENOSCOPY    03/01/2007   [id number] esophagus without evidence of Barrett's, mass/Mild erythema in the antrum.  Distorted pylorus consistent with prior ulcer disease.  Biopsies obtained/ 3. Junction of D1 and D2 erythematous, edematous, and strictured.  The lumen was narrowed to approximately 10 mm.   . ESOPHAGOGASTRODUODENOSCOPY N/A 09/09/2015   Procedure: ESOPHAGOGASTRODUODENOSCOPY (EGD);  Surgeon: Jerene Bears, MD;  Location: [facility] ENDOSCOPY;  Service: Endoscopy;  Laterality:  N/A;  . Left total hip replacement arthroplasty  June of 2001  . Right breast lumpectomy    . Right total hip replacement arthroplasty  August of 2001  . TOTAL KNEE ARTHROPLASTY     Current Outpatient Medications on File Prior to Visit  Medication Sig Dispense Refill  . albuterol (PROVENTIL HFA;VENTOLIN HFA) 108 (90 BASE) MCG/ACT inhaler Inhale 2 puffs into the lungs every 4 (four) hours as needed for wheezing or shortness of breath. 1 Inhaler 0  . albuterol (PROVENTIL) (2.5 MG/3ML) 0.083% nebulizer solution Take 3 mLs (2.5 mg total) by nebulization every 6 (six) hours as needed for wheezing or shortness of breath. 150 mL 0  . amLODipine (NORVASC) 10 MG tablet TAKE 1 TABLET BY MOUTH DAILY 90 tablet 1  . atorvastatin (LIPITOR) 20 MG tablet TAKE 1 TABLET BY MOUTH EVERY DAY 90 tablet 2  . Biotin 2500 MCG CAPS Take 2,500 mg by mouth daily.    . butalbital-aspirin-caffeine (FIORINAL) 50-325-40 MG per capsule TAKE ONE CAPSULE BY MOUTH EVERY 8 HOURS AS NEEDED FOR HEADACHE 30 capsule 0  . citalopram (CELEXA) 20 MG tablet TAKE 1 TABLET DAILY 90 tablet 3  . donepezil (ARICEPT) 10 MG tablet TAKE ONE TABLET BY MOUTH EVERY NIGHT AT BEDTIME 90 tablet 3  . ferrous sulfate 325 (65 FE) MG tablet Take 325 mg by mouth 2 (two) times daily with a meal.    . gabapentin (NEURONTIN) 300 MG capsule TAKE 1 CAPSULE  BY MOUTH THREE TIMES A DAY 270 capsule 3  . memantine (NAMENDA) 10 MG tablet TAKE 1 TABLET BY MOUTH TWICE A DAY 180 tablet 3  . metoprolol tartrate (LOPRESSOR) 25 MG tablet TAKE 1 TABLET BY MOUTH TWICE A DAY 180 tablet 3  . Multiple Vitamin (MULTIVITAMIN WITH MINERALS) TABS tablet Take 1 tablet by mouth daily.    . pantoprazole (PROTONIX) 40 MG tablet TAKE 1 TABLET BY MOUTH EVERY DAY 90 tablet 3  . traMADol (ULTRAM) 50 MG tablet Take 1 tablet (50 mg total) by mouth every 8 (eight) hours as needed for severe pain. Do not take with hydrocodone 30 tablet 0  . vitamin B-12 (CYANOCOBALAMIN) 500 MCG tablet Take 500  mcg by mouth daily.     No current facility-administered medications on file prior to visit.   Allergies  Allergen Reactions  . Demerol [Meperidine] Other (See Comments)    Altered mental status  . Meperidine Hcl Other (See Comments)    Altered mental status   Social History   Socioeconomic History  . Marital status: Widowed    Spouse name: Not on file  . Number of children: Not on file  . Years of education: Not on file  . Highest education level: Not on file  Occupational History  . Not on file  Tobacco Use  . Smoking status: Never Smoker  . Smokeless tobacco: Never Used  Substance and Sexual Activity  . Alcohol use: No  . Drug use: No  . Sexual activity: Never    Comment: widowed  Other Topics Concern  . Not on file  Social History Narrative  . Not on file   Social Determinants of Health   Financial Resource Strain:   . Difficulty of Paying Living Expenses:   Food Insecurity:   . Worried About Charity fundraiser in the Last Year:   . Arboriculturist in the Last Year:   Transportation Needs:   . Film/video editor (Medical):   Marland Kitchen Lack of Transportation (Non-Medical):   Physical Activity:   . Days of Exercise per Week:   . Minutes of Exercise per Session:   Stress:   . Feeling of Stress :   Social Connections:   . Frequency of Communication with Friends and Family:   . Frequency of Social Gatherings with Friends and Family:   . Attends Religious Services:   . Active Member of Clubs or Organizations:   . Attends Archivist Meetings:   Marland Kitchen Marital Status:   Intimate Partner Violence:   . Fear of Current or Ex-Partner:   . Emotionally Abused:   Marland Kitchen Physically Abused:   . Sexually Abused:      Review of Systems  All other systems reviewed and are negative.      Objective:   Physical Exam Constitutional:      Appearance: Normal appearance. She is normal weight.  Cardiovascular:     Rate and Rhythm: Normal rate and regular rhythm.   Pulmonary:     Effort: Pulmonary effort is normal.     Breath sounds: Normal breath sounds.  Musculoskeletal:     Left knee: Decreased range of motion. Tenderness present over the lateral joint line. No LCL laxity or MCL laxity.Normal alignment, normal meniscus and normal patellar mobility.  Neurological:     Mental Status: She is alert.           Assessment & Plan:  Stage 3 chronic kidney disease, unspecified whether stage 3a or  3b CKD - Plan: CBC with Differential/Platelet, COMPLETE METABOLIC PANEL WITH GFR  Acute pain of left knee  I suspect the patient has osteoarthritis in her left knee.  We discussed her treatment options however given her history of stage III chronic kidney disease, she cannot take NSAIDs.  Therefore she elects to receive a cortisone injection.  Using sterile technique, I injected the left knee with 2 cc lidocaine, 2 cc of Marcaine, and 2 cc of 40 mg/mL Kenalog.  The patient tolerated the procedure well without complication.  She has not had lab work in quite some time.  While the patient is here today I will check a CBC and a CMP.

## 2019-07-25 LAB — COMPLETE METABOLIC PANEL WITH GFR
AG Ratio: 1.7 (calc) (ref 1.0–2.5)
ALT: 19 U/L (ref 6–29)
AST: 19 U/L (ref 10–35)
Albumin: 4.3 g/dL (ref 3.6–5.1)
Alkaline phosphatase (APISO): 105 U/L (ref 37–153)
BUN/Creatinine Ratio: 15 (calc) (ref 6–22)
BUN: 23 mg/dL (ref 7–25)
CO2: 28 mmol/L (ref 20–32)
Calcium: 10.2 mg/dL (ref 8.6–10.4)
Chloride: 103 mmol/L (ref 98–110)
Creat: 1.54 mg/dL — ABNORMAL HIGH (ref 0.60–0.88)
GFR, Est African American: 36 mL/min/{1.73_m2} — ABNORMAL LOW (ref >=60)
GFR, Est Non African American: 31 mL/min/{1.73_m2} — ABNORMAL LOW (ref >=60)
Globulin: 2.6 g/dL (calc) (ref 1.9–3.7)
Glucose, Bld: 79 mg/dL (ref 65–99)
Potassium: 4.5 mmol/L (ref 3.5–5.3)
Sodium: 140 mmol/L (ref 135–146)
Total Bilirubin: 0.5 mg/dL (ref 0.2–1.2)
Total Protein: 6.9 g/dL (ref 6.1–8.1)

## 2019-07-25 LAB — CBC WITH DIFFERENTIAL/PLATELET
Absolute Monocytes: 864 cells/uL (ref 200–950)
Basophils Absolute: 40 cells/uL (ref 0–200)
Basophils Relative: 0.5 %
Eosinophils Absolute: 672 cells/uL — ABNORMAL HIGH (ref 15–500)
Eosinophils Relative: 8.4 %
HCT: 43.6 % (ref 35.0–45.0)
Hemoglobin: 14.5 g/dL (ref 11.7–15.5)
Lymphs Abs: 1760 cells/uL (ref 850–3900)
MCH: 29.5 pg (ref 27.0–33.0)
MCHC: 33.3 g/dL (ref 32.0–36.0)
MCV: 88.8 fL (ref 80.0–100.0)
MPV: 10.5 fL (ref 7.5–12.5)
Monocytes Relative: 10.8 %
Neutro Abs: 4664 cells/uL (ref 1500–7800)
Neutrophils Relative %: 58.3 %
Platelets: 361 10*3/uL (ref 140–400)
RBC: 4.91 10*6/uL (ref 3.80–5.10)
RDW: 14 % (ref 11.0–15.0)
Total Lymphocyte: 22 %
WBC: 8 10*3/uL (ref 3.8–10.8)

## 2019-08-03 ENCOUNTER — Other Ambulatory Visit: Payer: Self-pay | Admitting: Family Medicine

## 2019-08-05 ENCOUNTER — Other Ambulatory Visit: Payer: Self-pay | Admitting: Family Medicine

## 2019-08-05 MED ORDER — AMLODIPINE BESYLATE 10 MG PO TABS: 10.0000 mg | ORAL_TABLET | Freq: Every day | ORAL | 1 refills | Status: DC

## 2019-09-16 ENCOUNTER — Telehealth: Payer: Self-pay

## 2019-09-16 NOTE — Telephone Encounter (Signed)
Pt's Daughter [name] called stating that she needs a Dr's note to explain that she is her mother's caregiver. [name] has jury on October 12, 2019 and she needs to try and get out of it due to nobody being there to take care of her mother. [name] states she has already spoken to the courthouse and this is what they advised her to do. Please advise.

## 2019-09-22 NOTE — Telephone Encounter (Signed)
Letter is ready to be picked up on my desk.

## 2019-11-30 ENCOUNTER — Other Ambulatory Visit: Payer: Self-pay | Admitting: Family Medicine

## 2019-12-01 ENCOUNTER — Other Ambulatory Visit: Payer: Self-pay

## 2019-12-01 ENCOUNTER — Encounter: Payer: Self-pay | Admitting: Family Medicine

## 2019-12-01 ENCOUNTER — Ambulatory Visit (INDEPENDENT_AMBULATORY_CARE_PROVIDER_SITE_OTHER): Payer: Medicare PPO | Admitting: Family Medicine

## 2019-12-01 VITALS — BP 150/98 | HR 71 | Resp 16 | Ht 62.0 in | Wt 177.0 lb

## 2019-12-01 DIAGNOSIS — N1832 Chronic kidney disease, stage 3b: Secondary | ICD-10-CM | POA: Diagnosis not present

## 2019-12-01 LAB — CBC WITH DIFFERENTIAL/PLATELET
Absolute Monocytes: 840 cells/uL (ref 200–950)
Basophils Absolute: 40 cells/uL (ref 0–200)
Basophils Relative: 0.5 %
Eosinophils Absolute: 392 cells/uL (ref 15–500)
Eosinophils Relative: 4.9 %
HCT: 40 % (ref 35.0–45.0)
Hemoglobin: 13.4 g/dL (ref 11.7–15.5)
Lymphs Abs: 1504 cells/uL (ref 850–3900)
MCH: 30 pg (ref 27.0–33.0)
MCHC: 33.5 g/dL (ref 32.0–36.0)
MCV: 89.7 fL (ref 80.0–100.0)
MPV: 10.2 fL (ref 7.5–12.5)
Monocytes Relative: 10.5 %
Neutro Abs: 5224 cells/uL (ref 1500–7800)
Neutrophils Relative %: 65.3 %
Platelets: 362 10*3/uL (ref 140–400)
RBC: 4.46 10*6/uL (ref 3.80–5.10)
RDW: 13.5 % (ref 11.0–15.0)
Total Lymphocyte: 18.8 %
WBC: 8 10*3/uL (ref 3.8–10.8)

## 2019-12-01 LAB — COMPLETE METABOLIC PANEL WITH GFR
AG Ratio: 2 (calc) (ref 1.0–2.5)
ALT: 21 U/L (ref 6–29)
AST: 21 U/L (ref 10–35)
Albumin: 4.2 g/dL (ref 3.6–5.1)
Alkaline phosphatase (APISO): 80 U/L (ref 37–153)
BUN/Creatinine Ratio: 20 (calc) (ref 6–22)
BUN: 29 mg/dL — ABNORMAL HIGH (ref 7–25)
CO2: 25 mmol/L (ref 20–32)
Calcium: 9.5 mg/dL (ref 8.6–10.4)
Chloride: 104 mmol/L (ref 98–110)
Creat: 1.44 mg/dL — ABNORMAL HIGH (ref 0.60–0.88)
GFR, Est African American: 38 mL/min/{1.73_m2} — ABNORMAL LOW (ref >=60)
GFR, Est Non African American: 33 mL/min/{1.73_m2} — ABNORMAL LOW (ref >=60)
Globulin: 2.1 g/dL (calc) (ref 1.9–3.7)
Glucose, Bld: 88 mg/dL (ref 65–99)
Potassium: 4.2 mmol/L (ref 3.5–5.3)
Sodium: 137 mmol/L (ref 135–146)
Total Bilirubin: 0.6 mg/dL (ref 0.2–1.2)
Total Protein: 6.3 g/dL (ref 6.1–8.1)

## 2019-12-01 NOTE — Progress Notes (Signed)
Subjective:    Patient ID: [name], female    DOB: 31-Jul-1932, 86 y.o.   MRN: [id number]  HPI  Patient has a history of stage III chronic kidney disease along with hypertension and hyperlipidemia as well as a remote history of an upper GI bleed.  Situation is complicated by her mild to moderate dementia.  She lives with her daughter who cares for her.  Patient is here today as I have recommended checking her renal function every 3 to 4 months to monitor for any deterioration in her renal function.  Blood pressure today is elevated at 150/98.  However the patient's daughter states it is much better at home.  She states that her mom gets nervous coming to the doctor and this raises her blood pressure.  Patient has not had her Covid shot yet.  Today we spent more than 15 minutes discussing this.  Patient will be at high risk for complications if she were to contract COVID-19.  I strongly recommended the Covid vaccine and try to talk her into it.  Patient will consider it.  Otherwise she is doing well. Past Medical History:  Diagnosis Date  . CKD (chronic kidney disease) stage 3, GFR 30-59 ml/min   . Hypercholesterolemia   . Hypertension   . Mild acid reflux   . Mitral valve prolapse   . Renal insufficiency   . Spinal stenosis   . Staph infection     history of a wound infection following a previous right hip surgery  . Upper GI bleed    duodenal ulcer (09/2015) s/p endo clipping   Past Surgical History:  Procedure Laterality Date  . ABCESS DRAINAGE    . ABDOMINAL HYSTERECTOMY    . COLONOSCOPY  04/30/01   [id number] hemorrhoids, otherwise, normal rectum/colon/Normal terminal ileum  . COLONOSCOPY  03/02/2007   [id number] sigmoid colon diverticula.  Otherwise no polyps, masses/Normal terminal ileum.  Approximately 10-15 cm of the distal terminal ileum visualized/normal view of the rectum  . ESOPHAGOGASTRODUODENOSCOPY    03/01/2007   [id number] esophagus without evidence of Barrett's,  mass/Mild erythema in the antrum.  Distorted pylorus consistent with prior ulcer disease.  Biopsies obtained/ 3. Junction of D1 and D2 erythematous, edematous, and strictured.  The lumen was narrowed to approximately 10 mm.   . ESOPHAGOGASTRODUODENOSCOPY N/A 09/09/2015   Procedure: ESOPHAGOGASTRODUODENOSCOPY (EGD);  Surgeon: Jerene Bears, MD;  Location: [facility] ENDOSCOPY;  Service: Endoscopy;  Laterality: N/A;  . Left total hip replacement arthroplasty  June of 2001  . Right breast lumpectomy    . Right total hip replacement arthroplasty  August of 2001  . TOTAL KNEE ARTHROPLASTY     Current Outpatient Medications on File Prior to Visit  Medication Sig Dispense Refill  . albuterol (PROVENTIL HFA;VENTOLIN HFA) 108 (90 BASE) MCG/ACT inhaler Inhale 2 puffs into the lungs every 4 (four) hours as needed for wheezing or shortness of breath. 1 Inhaler 0  . albuterol (PROVENTIL) (2.5 MG/3ML) 0.083% nebulizer solution Take 3 mLs (2.5 mg total) by nebulization every 6 (six) hours as needed for wheezing or shortness of breath. 150 mL 0  . amLODipine (NORVASC) 10 MG tablet Take 1 tablet (10 mg total) by mouth daily. 90 tablet 1  . atorvastatin (LIPITOR) 20 MG tablet TAKE 1 TABLET BY MOUTH EVERY DAY 90 tablet 2  . Biotin 2500 MCG CAPS Take 2,500 mg by mouth daily.    . butalbital-aspirin-caffeine (FIORINAL) 50-325-40 MG per capsule TAKE ONE CAPSULE BY MOUTH EVERY  8 HOURS AS NEEDED FOR HEADACHE 30 capsule 0  . citalopram (CELEXA) 20 MG tablet TAKE 1 TABLET DAILY 90 tablet 3  . donepezil (ARICEPT) 10 MG tablet TAKE ONE TABLET BY MOUTH EVERY NIGHT AT BEDTIME 90 tablet 3  . ferrous sulfate 325 (65 FE) MG tablet Take 325 mg by mouth 2 (two) times daily with a meal.    . gabapentin (NEURONTIN) 300 MG capsule TAKE 1 CAPSULE BY MOUTH THREE TIMES A DAY 270 capsule 3  . memantine (NAMENDA) 10 MG tablet TAKE 1 TABLET BY MOUTH TWICE A DAY 180 tablet 3  . metoprolol tartrate (LOPRESSOR) 25 MG tablet TAKE 1 TABLET BY MOUTH  TWICE A DAY 180 tablet 3  . Multiple Vitamin (MULTIVITAMIN WITH MINERALS) TABS tablet Take 1 tablet by mouth daily.    . pantoprazole (PROTONIX) 40 MG tablet TAKE 1 TABLET BY MOUTH EVERY DAY 90 tablet 3  . traMADol (ULTRAM) 50 MG tablet Take 1 tablet (50 mg total) by mouth every 8 (eight) hours as needed for severe pain. Do not take with hydrocodone 30 tablet 0  . vitamin B-12 (CYANOCOBALAMIN) 500 MCG tablet Take 500 mcg by mouth daily.     No current facility-administered medications on file prior to visit.   Allergies  Allergen Reactions  . Demerol [Meperidine] Other (See Comments)    Altered mental status  . Meperidine Hcl Other (See Comments)    Altered mental status   Social History   Socioeconomic History  . Marital status: Widowed    Spouse name: Not on file  . Number of children: Not on file  . Years of education: Not on file  . Highest education level: Not on file  Occupational History  . Not on file  Tobacco Use  . Smoking status: Never Smoker  . Smokeless tobacco: Never Used  Substance and Sexual Activity  . Alcohol use: No  . Drug use: No  . Sexual activity: Never    Comment: widowed  Other Topics Concern  . Not on file  Social History Narrative  . Not on file   Social Determinants of Health   Financial Resource Strain:   . Difficulty of Paying Living Expenses: Not on file  Food Insecurity:   . Worried About Charity fundraiser in the Last Year: Not on file  . Ran Out of Food in the Last Year: Not on file  Transportation Needs:   . Lack of Transportation (Medical): Not on file  . Lack of Transportation (Non-Medical): Not on file  Physical Activity:   . Days of Exercise per Week: Not on file  . Minutes of Exercise per Session: Not on file  Stress:   . Feeling of Stress : Not on file  Social Connections:   . Frequency of Communication with Friends and Family: Not on file  . Frequency of Social Gatherings with Friends and Family: Not on file  . Attends  Religious Services: Not on file  . Active Member of Clubs or Organizations: Not on file  . Attends Archivist Meetings: Not on file  . Marital Status: Not on file  Intimate Partner Violence:   . Fear of Current or Ex-Partner: Not on file  . Emotionally Abused: Not on file  . Physically Abused: Not on file  . Sexually Abused: Not on file     Review of Systems  All other systems reviewed and are negative.      Objective:   Physical Exam Constitutional:  Appearance: Normal appearance. She is normal weight.  Cardiovascular:     Rate and Rhythm: Normal rate and regular rhythm.  Pulmonary:     Effort: Pulmonary effort is normal.     Breath sounds: Normal breath sounds.  Neurological:     Mental Status: She is alert.           Assessment & Plan:  Stage 3b chronic kidney disease - Plan: COMPLETE METABOLIC PANEL WITH GFR, CBC with Differential/Platelet Primarily I want to recheck her renal function today to ensure that it has not deteriorated further.  She is not taking NSAIDs.  She is trying to drink plenty of water.  My only issue would be her elevated blood pressure however the daughter believes that this is due to whitecoat syndrome.  Therefore I recommended they monitor this closely at home and if consistently greater than 140/90, we need to address her blood pressure to prevent it contributing to worsening renal function.  I also strongly recommended that she receive the COVID-19 vaccine and spent more than 15 minutes trying to convince both she and her daughter that it is in their best interest.

## 2019-12-03 ENCOUNTER — Telehealth: Payer: Self-pay

## 2019-12-03 NOTE — Telephone Encounter (Signed)
Pt's daughter called to report a knot where the blood was drawn on 09/14. She reports a lot of bruising and states the pt has never had it hurt so bad before. Pt's daughter noticed the knot on 09/15 and the bruising is worst today 09/16. Please advise.

## 2019-12-03 NOTE — Telephone Encounter (Signed)
Lvm on Kimberly's phone of plan.

## 2019-12-03 NOTE — Telephone Encounter (Signed)
I would wrap it gently with gauze and it should subside in 1 week.

## 2019-12-04 ENCOUNTER — Other Ambulatory Visit: Payer: Self-pay | Admitting: Family Medicine

## 2019-12-04 NOTE — Telephone Encounter (Signed)
Requested Prescriptions   Pending Prescriptions Disp Refills  . traMADol (ULTRAM) 50 MG tablet [Pharmacy Med Name: TRAMADOL HCL 50 MG TABLET] 30 tablet 0    Sig: TAKE 1 TABLET BY MOUTH EVERY 8 HOURS AS NEEDED FOR SEVERE PAIN. DO NOT TAKE WITH HYDROCODONE     Last OV 12/01/2019   Last written 12/07/2018

## 2019-12-07 ENCOUNTER — Other Ambulatory Visit: Payer: Self-pay

## 2019-12-07 ENCOUNTER — Ambulatory Visit (INDEPENDENT_AMBULATORY_CARE_PROVIDER_SITE_OTHER): Payer: Medicare PPO | Admitting: Family Medicine

## 2019-12-07 VITALS — BP 130/90 | HR 79 | Temp 98.2°F | Ht 62.0 in | Wt 173.0 lb

## 2019-12-07 DIAGNOSIS — M25562 Pain in left knee: Secondary | ICD-10-CM

## 2019-12-07 MED ORDER — TRAMADOL HCL 50 MG PO TABS
ORAL_TABLET | ORAL | 0 refills | Status: DC
Start: 2019-12-07 — End: 2021-08-20

## 2019-12-07 NOTE — Progress Notes (Signed)
Subjective:    Patient ID: [name], female    DOB: August 28, 1932, 86 y.o.   MRN: [id number]  HPI  Patient developed a large bruise in her right antecubital fossa after her blood work.  There is a small hematoma palpable below the skin in the exact same area roughly the diameter of a nickel.  The swelling is improving and the bruising is fading.  The bruise extends from her mid bicep down to her mid forearm.  There is no warmth and no tenderness to palpation.  However the pain in her left leg is getting worse.  She states that whenever she walks, she feels like her left knee is going to buckle on her at any time.  She has tenderness to palpation over the joint lines bilaterally.  The majority of the pain is in the posterior knee and in the distal hamstring.  She states that her knee feels unsteady.  She states that due to the pain, she feels like the knee is going to give way on her and caused her to fall.  She has no pain to palpation distal to the knee.  There is no warmth or erythema or swelling.  There is no bruising or warmth or swelling proximal to the knee.  When I asked her to locate the pain, she circles her kneecap with her hands.   Past Medical History:  Diagnosis Date  . CKD (chronic kidney disease) stage 3, GFR 30-59 ml/min   . Hypercholesterolemia   . Hypertension   . Mild acid reflux   . Mitral valve prolapse   . Renal insufficiency   . Spinal stenosis   . Staph infection     history of a wound infection following a previous right hip surgery  . Upper GI bleed    duodenal ulcer (09/2015) s/p endo clipping   Past Surgical History:  Procedure Laterality Date  . ABCESS DRAINAGE    . ABDOMINAL HYSTERECTOMY    . COLONOSCOPY  04/30/01   SVX:[id number] hemorrhoids, otherwise, normal rectum/colon/Normal terminal ileum  . COLONOSCOPY  03/02/2007   PQZ:RAQT sigmoid colon diverticula.  Otherwise no polyps, masses/Normal terminal ileum.  Approximately 10-15 cm of the distal  terminal ileum visualized/normal view of the rectum  . ESOPHAGOGASTRODUODENOSCOPY    03/01/2007   MAU:QJFHLK esophagus without evidence of Barrett's, mass/Mild erythema in the antrum.  Distorted pylorus consistent with prior ulcer disease.  Biopsies obtained/ 3. Junction of D1 and D2 erythematous, edematous, and strictured.  The lumen was narrowed to approximately 10 mm.   . ESOPHAGOGASTRODUODENOSCOPY N/A 09/09/2015   Procedure: ESOPHAGOGASTRODUODENOSCOPY (EGD);  Surgeon: Jerene Bears, MD;  Location: [facility] ENDOSCOPY;  Service: Endoscopy;  Laterality: N/A;  . Left total hip replacement arthroplasty  June of 2001  . Right breast lumpectomy    . Right total hip replacement arthroplasty  August of 2001  . TOTAL KNEE ARTHROPLASTY     Current Outpatient Medications on File Prior to Visit  Medication Sig Dispense Refill  . albuterol (PROVENTIL HFA;VENTOLIN HFA) 108 (90 BASE) MCG/ACT inhaler Inhale 2 puffs into the lungs every 4 (four) hours as needed for wheezing or shortness of breath. 1 Inhaler 0  . albuterol (PROVENTIL) (2.5 MG/3ML) 0.083% nebulizer solution Take 3 mLs (2.5 mg total) by nebulization every 6 (six) hours as needed for wheezing or shortness of breath. 150 mL 0  . amLODipine (NORVASC) 10 MG tablet Take 1 tablet (10 mg total) by mouth daily. 90 tablet 1  . atorvastatin (  LIPITOR) 20 MG tablet TAKE 1 TABLET BY MOUTH EVERY DAY 90 tablet 2  . Biotin 2500 MCG CAPS Take 2,500 mg by mouth daily.    . butalbital-aspirin-caffeine (FIORINAL) 50-325-40 MG per capsule TAKE ONE CAPSULE BY MOUTH EVERY 8 HOURS AS NEEDED FOR HEADACHE 30 capsule 0  . citalopram (CELEXA) 20 MG tablet TAKE 1 TABLET DAILY 90 tablet 3  . donepezil (ARICEPT) 10 MG tablet TAKE ONE TABLET BY MOUTH EVERY NIGHT AT BEDTIME 90 tablet 3  . ferrous sulfate 325 (65 FE) MG tablet Take 325 mg by mouth 2 (two) times daily with a meal.    . gabapentin (NEURONTIN) 300 MG capsule TAKE 1 CAPSULE BY MOUTH THREE TIMES A DAY 270 capsule 3  .  memantine (NAMENDA) 10 MG tablet TAKE 1 TABLET BY MOUTH TWICE A DAY 180 tablet 3  . metoprolol tartrate (LOPRESSOR) 25 MG tablet TAKE 1 TABLET BY MOUTH TWICE A DAY 180 tablet 3  . Multiple Vitamin (MULTIVITAMIN WITH MINERALS) TABS tablet Take 1 tablet by mouth daily.    . pantoprazole (PROTONIX) 40 MG tablet TAKE 1 TABLET BY MOUTH EVERY DAY 90 tablet 3  . vitamin B-12 (CYANOCOBALAMIN) 500 MCG tablet Take 500 mcg by mouth daily.     No current facility-administered medications on file prior to visit.   Allergies  Allergen Reactions  . Demerol [Meperidine] Other (See Comments)    Altered mental status  . Meperidine Hcl Other (See Comments)    Altered mental status   Social History   Socioeconomic History  . Marital status: Widowed    Spouse name: Not on file  . Number of children: Not on file  . Years of education: Not on file  . Highest education level: Not on file  Occupational History  . Not on file  Tobacco Use  . Smoking status: Never Smoker  . Smokeless tobacco: Never Used  Substance and Sexual Activity  . Alcohol use: No  . Drug use: No  . Sexual activity: Never    Comment: widowed  Other Topics Concern  . Not on file  Social History Narrative  . Not on file   Social Determinants of Health   Financial Resource Strain:   . Difficulty of Paying Living Expenses: Not on file  Food Insecurity:   . Worried About Charity fundraiser in the Last Year: Not on file  . Ran Out of Food in the Last Year: Not on file  Transportation Needs:   . Lack of Transportation (Medical): Not on file  . Lack of Transportation (Non-Medical): Not on file  Physical Activity:   . Days of Exercise per Week: Not on file  . Minutes of Exercise per Session: Not on file  Stress:   . Feeling of Stress : Not on file  Social Connections:   . Frequency of Communication with Friends and Family: Not on file  . Frequency of Social Gatherings with Friends and Family: Not on file  . Attends  Religious Services: Not on file  . Active Member of Clubs or Organizations: Not on file  . Attends Archivist Meetings: Not on file  . Marital Status: Not on file  Intimate Partner Violence:   . Fear of Current or Ex-Partner: Not on file  . Emotionally Abused: Not on file  . Physically Abused: Not on file  . Sexually Abused: Not on file     Review of Systems  All other systems reviewed and are negative.  Objective:   Physical Exam Constitutional:      Appearance: Normal appearance. She is normal weight.  Cardiovascular:     Rate and Rhythm: Normal rate and regular rhythm.  Pulmonary:     Effort: Pulmonary effort is normal.     Breath sounds: Normal breath sounds.  Musculoskeletal:     Right upper arm: Swelling and deformity present.       Arms:     Left knee: Swelling, bony tenderness and crepitus present. Decreased range of motion. Tenderness present over the medial joint line and lateral joint line. No LCL laxity, MCL laxity, ACL laxity or PCL laxity. Neurological:     Mental Status: She is alert.           Assessment & Plan:  Acute pain of left knee  Given her history of chronic kidney disease, she cannot tolerate NSAIDs.  She can also not tolerate NSAIDs due to her history of an upper GI bleed.  She can use Tylenol for pain.  However Tylenol is not sufficient.  I am concerned about tramadol exacerbating her confusion and dementia and leading to falls.  Therefore I recommended a cortisone shot in the knee.  I believe she has osteoarthritis in the left knee and that is why her knee is buckling is due to the pain that she is feeling causing her to feel unstable on her legs and making her a high fall risk.  Therefore using sterile technique, I injected the left knee with 2 cc lidocaine, 2 cc of Marcaine, and 2 cc of 40 mg/mL Kenalog.  Patient tolerated procedure well without complication.

## 2020-02-10 ENCOUNTER — Other Ambulatory Visit: Payer: Self-pay | Admitting: *Deleted

## 2020-02-10 ENCOUNTER — Encounter: Payer: Self-pay | Admitting: Family Medicine

## 2020-02-10 ENCOUNTER — Ambulatory Visit (INDEPENDENT_AMBULATORY_CARE_PROVIDER_SITE_OTHER): Payer: Medicare PPO | Admitting: Family Medicine

## 2020-02-10 ENCOUNTER — Other Ambulatory Visit: Payer: Self-pay

## 2020-02-10 ENCOUNTER — Ambulatory Visit
Admission: RE | Admit: 2020-02-10 | Discharge: 2020-02-10 | Disposition: A | Discharge status: Home / Self Care | Payer: Medicare PPO | Source: Ambulatory Visit | Attending: Family Medicine | Admitting: Family Medicine

## 2020-02-10 ENCOUNTER — Ambulatory Visit (HOSPITAL_COMMUNITY): Payer: Medicare PPO

## 2020-02-10 ENCOUNTER — Other Ambulatory Visit: Payer: Self-pay | Admitting: Family Medicine

## 2020-02-10 VITALS — BP 130/68 | HR 78 | Temp 98.1°F | Resp 14 | Ht 62.0 in | Wt 163.0 lb

## 2020-02-10 DIAGNOSIS — R634 Abnormal weight loss: Secondary | ICD-10-CM

## 2020-02-10 DIAGNOSIS — R195 Other fecal abnormalities: Secondary | ICD-10-CM

## 2020-02-10 DIAGNOSIS — L089 Local infection of the skin and subcutaneous tissue, unspecified: Secondary | ICD-10-CM

## 2020-02-10 DIAGNOSIS — H5789 Other specified disorders of eye and adnexa: Secondary | ICD-10-CM | POA: Diagnosis not present

## 2020-02-10 DIAGNOSIS — R1904 Left lower quadrant abdominal swelling, mass and lump: Secondary | ICD-10-CM

## 2020-02-10 DIAGNOSIS — T148XXA Other injury of unspecified body region, initial encounter: Secondary | ICD-10-CM

## 2020-02-10 DIAGNOSIS — K439 Ventral hernia without obstruction or gangrene: Secondary | ICD-10-CM

## 2020-02-10 DIAGNOSIS — E860 Dehydration: Secondary | ICD-10-CM

## 2020-02-10 DIAGNOSIS — S60922A Unspecified superficial injury of left hand, initial encounter: Secondary | ICD-10-CM

## 2020-02-10 DIAGNOSIS — N281 Cyst of kidney, acquired: Secondary | ICD-10-CM | POA: Diagnosis not present

## 2020-02-10 LAB — CBC WITH DIFFERENTIAL/PLATELET
Absolute Monocytes: 935 cells/uL (ref 200–950)
Basophils Absolute: 61 cells/uL (ref 0–200)
Basophils Relative: 0.8 %
Eosinophils Absolute: 426 cells/uL (ref 15–500)
Eosinophils Relative: 5.6 %
HCT: 40.4 % (ref 35.0–45.0)
Hemoglobin: 13.7 g/dL (ref 11.7–15.5)
Lymphs Abs: 1368 cells/uL (ref 850–3900)
MCH: 30 pg (ref 27.0–33.0)
MCHC: 33.9 g/dL (ref 32.0–36.0)
MCV: 88.6 fL (ref 80.0–100.0)
MPV: 10.5 fL (ref 7.5–12.5)
Monocytes Relative: 12.3 %
Neutro Abs: 4811 cells/uL (ref 1500–7800)
Neutrophils Relative %: 63.3 %
Platelets: 344 10*3/uL (ref 140–400)
RBC: 4.56 10*6/uL (ref 3.80–5.10)
RDW: 14 % (ref 11.0–15.0)
Total Lymphocyte: 18 %
WBC: 7.6 10*3/uL (ref 3.8–10.8)

## 2020-02-10 LAB — COMPREHENSIVE METABOLIC PANEL
AG Ratio: 1.7 (calc) (ref 1.0–2.5)
ALT: 18 U/L (ref 6–29)
AST: 20 U/L (ref 10–35)
Albumin: 4.1 g/dL (ref 3.6–5.1)
Alkaline phosphatase (APISO): 95 U/L (ref 37–153)
BUN/Creatinine Ratio: 15 (calc) (ref 6–22)
BUN: 26 mg/dL — ABNORMAL HIGH (ref 7–25)
CO2: 27 mmol/L (ref 20–32)
Calcium: 9.4 mg/dL (ref 8.6–10.4)
Chloride: 104 mmol/L (ref 98–110)
Creat: 1.7 mg/dL — ABNORMAL HIGH (ref 0.60–0.88)
Globulin: 2.4 g/dL (calc) (ref 1.9–3.7)
Glucose, Bld: 100 mg/dL — ABNORMAL HIGH (ref 65–99)
Potassium: 3.9 mmol/L (ref 3.5–5.3)
Sodium: 139 mmol/L (ref 135–146)
Total Bilirubin: 0.4 mg/dL (ref 0.2–1.2)
Total Protein: 6.5 g/dL (ref 6.1–8.1)

## 2020-02-10 MED ORDER — CEPHALEXIN 500 MG PO CAPS: 500.0000 mg | ORAL_CAPSULE | Freq: Two times a day (BID) | ORAL | 0 refills | Status: DC

## 2020-02-10 NOTE — Progress Notes (Signed)
Subjective:    Patient ID: [name], female    DOB: 1932-03-22, 86 y.o.   MRN: [id number]  Patient presents for [facility] Stools (intermittent black stools), Lump to L Side (baseball size lump to L side of abd), and Laceration (L hand with green colored drainage)  Pt here knot on left side for the past 1-2 weeks.  She does not follow any particular injury she did not have any pain but she had noted some dark stools concerning for blood intermittently.  She does have history of GI bleed per report.  She is on PPI.  She does however also take iron tablets twice a day.  She states that the lump on her left side seems to be increasing the past few days that she wants to have it checked.  They also noted that she knocked her hand against something she resulted in a skin tear but has now had yellow-green pus draining out of it.  She does have tenderness in the left hand.  Medications reviewed no recent changes   Weightdown 10lbs, good appetite reported   Review Of Systems: PER PT AND DAUGHTER, 2/2 Dementia   GEN- denies fatigue, fever, +weight loss,weakness, recent illness HEENT- denies eye drainage, change in vision, nasal discharge, CVS- denies chest pain, palpitations RESP- denies SOB, cough, wheeze ABD- denies N/V, +change in stools, abd pain GU- denies dysuria, hematuria, dribbling, incontinence MSK- denies joint pain, muscle aches, injury Neuro- denies headache, dizziness, syncope, seizure activity       Objective:    BP 130/68   Pulse 78   Temp 98.1 F (36.7 C) (Temporal)   Resp 14   Ht 5\' 2"  (1.575 m)   Wt 163 lb (73.9 kg)   SpO2 96%   BMI 29.81 kg/m  GEN- NAD, alert and oriented x3 HEENT- PERRL, EOMI, non injected sclera, pink conjunctiva, MMM, oropharynx clear Neck- Supple, no lymphadenopathy  CVS- RRR, no murmur RESP-CTAB ABD-NABS,soft, LLQ soft tissue bulge palpated, NT, not reduced, no suprapubic tenderness  Left hand base of thumb skin tear with surrounding  erythema towards wrist, TTP, mild bogginess but no discrete abscess  EXT- No edema Pulses- Radial  2+        Assessment & Plan:      Problem List Items Addressed This Visit    None    Visit Diagnoses    Left lower quadrant abdominal mass    -  Primary   DD include hernia, subcutanous mass/lipoma, malignancy with her weight loss, obtain CT scan, STAT labs    Relevant Orders   CBC with Differential/Platelet (Completed)   Comprehensive metabolic panel (Completed)   CT Abdomen Pelvis Wo Contrast (Completed)   Dark stools       check CBC, pt on iron so this can give dark stools, no bright red blood, occ loose stools but not consistent    Relevant Orders   CT Abdomen Pelvis Wo Contrast (Completed)   Unintentional weight loss of more than 5% body weight within 1 month       Relevant Orders   CBC with Differential/Platelet (Completed)   Comprehensive metabolic panel (Completed)   CT Abdomen Pelvis Wo Contrast (Completed)   Infected skin tear       cleaned at bedside,keflex sent to pharmacy    Eye drainage - Chronic eye irritation/redness       End of visit states eye drains consistently and lid gets irritated brother had something similar needed referral to eye doctor  Relevant Orders   Ambulatory referral to Ophthalmology      Note: This dictation was prepared with Dragon dictation along with smaller phrase technology. Any transcriptional errors that result from this process are unintentional.

## 2020-02-10 NOTE — Patient Instructions (Signed)
CT scan to be done Referral to eye doctor We will call with lab results  Take antibiotics for hand

## 2020-02-19 ENCOUNTER — Other Ambulatory Visit: Payer: Medicare PPO

## 2020-02-19 ENCOUNTER — Other Ambulatory Visit: Payer: Self-pay

## 2020-02-19 DIAGNOSIS — E860 Dehydration: Secondary | ICD-10-CM | POA: Diagnosis not present

## 2020-02-20 LAB — BASIC METABOLIC PANEL WITH GFR
BUN/Creatinine Ratio: 19 (calc) (ref 6–22)
BUN: 27 mg/dL — ABNORMAL HIGH (ref 7–25)
CO2: 27 mmol/L (ref 20–32)
Calcium: 9.3 mg/dL (ref 8.6–10.4)
Chloride: 99 mmol/L (ref 98–110)
Creat: 1.39 mg/dL — ABNORMAL HIGH (ref 0.60–0.88)
GFR, Est African American: 40 mL/min/{1.73_m2} — ABNORMAL LOW (ref >=60)
GFR, Est Non African American: 34 mL/min/{1.73_m2} — ABNORMAL LOW (ref >=60)
Glucose, Bld: 80 mg/dL (ref 65–99)
Potassium: 4.6 mmol/L (ref 3.5–5.3)
Sodium: 135 mmol/L (ref 135–146)

## 2020-02-26 DIAGNOSIS — H16103 Unspecified superficial keratitis, bilateral: Secondary | ICD-10-CM | POA: Diagnosis not present

## 2020-03-17 DIAGNOSIS — K439 Ventral hernia without obstruction or gangrene: Secondary | ICD-10-CM | POA: Diagnosis not present

## 2020-04-25 ENCOUNTER — Other Ambulatory Visit: Payer: Self-pay | Admitting: Family Medicine

## 2020-04-26 DIAGNOSIS — H02102 Unspecified ectropion of right lower eyelid: Secondary | ICD-10-CM | POA: Diagnosis not present

## 2020-04-26 DIAGNOSIS — H2513 Age-related nuclear cataract, bilateral: Secondary | ICD-10-CM | POA: Diagnosis not present

## 2020-04-26 DIAGNOSIS — H02105 Unspecified ectropion of left lower eyelid: Secondary | ICD-10-CM | POA: Diagnosis not present

## 2020-05-23 ENCOUNTER — Encounter: Payer: Self-pay | Admitting: Family Medicine

## 2020-05-23 ENCOUNTER — Other Ambulatory Visit: Payer: Self-pay

## 2020-05-23 ENCOUNTER — Ambulatory Visit (INDEPENDENT_AMBULATORY_CARE_PROVIDER_SITE_OTHER): Payer: Medicare PPO | Admitting: Family Medicine

## 2020-05-23 VITALS — BP 138/88 | HR 80 | Temp 97.3°F | Ht 62.0 in | Wt 157.0 lb

## 2020-05-23 DIAGNOSIS — G309 Alzheimer's disease, unspecified: Secondary | ICD-10-CM | POA: Diagnosis not present

## 2020-05-23 DIAGNOSIS — N1832 Chronic kidney disease, stage 3b: Secondary | ICD-10-CM

## 2020-05-23 DIAGNOSIS — Z8719 Personal history of other diseases of the digestive system: Secondary | ICD-10-CM | POA: Diagnosis not present

## 2020-05-23 DIAGNOSIS — F0390 Unspecified dementia without behavioral disturbance: Secondary | ICD-10-CM | POA: Insufficient documentation

## 2020-05-23 DIAGNOSIS — F0280 Dementia in other diseases classified elsewhere without behavioral disturbance: Secondary | ICD-10-CM

## 2020-05-23 NOTE — Progress Notes (Signed)
Subjective:    Patient ID: [name], female    DOB: 1932/09/11, 87 y.o.   MRN: [id number]  HPI  Patient has a history of stage III chronic kidney disease along with hypertension and hyperlipidemia as well as a remote history of an upper GI bleed.  Situation is complicated by her worsening dementia.  She lives with her daughter who cares for her.  She has begun to forget who her daughter is. Patient is here today for follow-up. Daughter states that she is getting more wobbly on her feet and unsteady. She takes gabapentin for spinal stenosis and back pain however she does not mention back pain today on her visit and her daughter states that she seldom complains of any pain in her back. She primarily hurts in her knees and in her neck. She also uses tramadol occasionally for back pain and neck pain. I explained to her daughter that both of these medications could potentially cause unsteadiness particularly gabapentin. I question if she is really benefiting from the gabapentin. She is also on atorvastatin but given her advanced age I question if this is beneficial to her. Past Medical History:  Diagnosis Date  . CKD (chronic kidney disease) stage 3, GFR 30-59 ml/min (HCC)   . Dementia ([place])   . Hypercholesterolemia   . Hypertension   . Mild acid reflux   . Mitral valve prolapse   . Renal insufficiency   . Spinal stenosis   . Staph infection     history of a wound infection following a previous right hip surgery  . Upper GI bleed    duodenal ulcer (09/2015) s/p endo clipping    Past Surgical History:  Procedure Laterality Date  . ABCESS DRAINAGE    . ABDOMINAL HYSTERECTOMY    . COLONOSCOPY  04/30/01   QVZ:DGLOVFIE hemorrhoids, otherwise, normal rectum/colon/Normal terminal ileum  . COLONOSCOPY  03/02/2007   PPI:RJJO sigmoid colon diverticula.  Otherwise no polyps, masses/Normal terminal ileum.  Approximately 10-15 cm of the distal terminal ileum visualized/normal view of the rectum   . ESOPHAGOGASTRODUODENOSCOPY    03/01/2007   ACZ:YSAYTK esophagus without evidence of Barrett's, mass/Mild erythema in the antrum.  Distorted pylorus consistent with prior ulcer disease.  Biopsies obtained/ 3. Junction of D1 and D2 erythematous, edematous, and strictured.  The lumen was narrowed to approximately 10 mm.   . ESOPHAGOGASTRODUODENOSCOPY N/A 09/09/2015   Procedure: ESOPHAGOGASTRODUODENOSCOPY (EGD);  Surgeon: Jerene Bears, MD;  Location[facility] ENDOSCOPY;  Service: Endoscopy;  Laterality: N/A;  . Left total hip replacement arthroplasty  June of 2001  . Right breast lumpectomy    . Right total hip replacement arthroplasty  August of 2001  . TOTAL KNEE ARTHROPLASTY     Current Outpatient Medications on File Prior to Visit  Medication Sig Dispense Refill  . albuterol (PROVENTIL HFA;VENTOLIN HFA) 108 (90 BASE) MCG/ACT inhaler Inhale 2 puffs into the lungs every 4 (four) hours as needed for wheezing or shortness of breath. 1 Inhaler 0  . albuterol (PROVENTIL) (2.5 MG/3ML) 0.083% nebulizer solution Take 3 mLs (2.5 mg total) by nebulization every 6 (six) hours as needed for wheezing or shortness of breath. 150 mL 0  . amLODipine (NORVASC) 10 MG tablet TAKE 1 TABLET BY MOUTH EVERY DAY 90 tablet 1  . atorvastatin (LIPITOR) 20 MG tablet TAKE 1 TABLET BY MOUTH EVERY DAY 90 tablet 2  . Biotin 2500 MCG CAPS Take 2,500 mg by mouth daily.    . citalopram (CELEXA) 20 MG tablet  TAKE 1 TABLET DAILY 90 tablet 3  . donepezil (ARICEPT) 10 MG tablet TAKE ONE TABLET BY MOUTH EVERY NIGHT AT BEDTIME 90 tablet 3  . ferrous sulfate 325 (65 FE) MG tablet Take 325 mg by mouth 2 (two) times daily with a meal.    . gabapentin (NEURONTIN) 300 MG capsule TAKE 1 CAPSULE BY MOUTH THREE TIMES A DAY 270 capsule 3  . memantine (NAMENDA) 10 MG tablet TAKE 1 TABLET BY MOUTH TWICE A DAY 180 tablet 3  . metoprolol tartrate (LOPRESSOR) 25 MG tablet TAKE 1 TABLET BY MOUTH TWICE A DAY 180 tablet 3  . Multiple Vitamin (MULTIVITAMIN  WITH MINERALS) TABS tablet Take 1 tablet by mouth daily.    . pantoprazole (PROTONIX) 40 MG tablet TAKE 1 TABLET BY MOUTH EVERY DAY 90 tablet 3  . traMADol (ULTRAM) 50 MG tablet TAKE 1 TABLET BY MOUTH EVERY 8 HOURS AS NEEDED FOR SEVERE PAIN. 30 tablet 0  . vitamin B-12 (CYANOCOBALAMIN) 500 MCG tablet Take 500 mcg by mouth daily.     No current facility-administered medications on file prior to visit.    Allergies  Allergen Reactions  . Demerol [Meperidine] Other (See Comments)    Altered mental status  . Meperidine Hcl Other (See Comments)    Altered mental status   Social History   Socioeconomic History  . Marital status: Widowed    Spouse name: Not on file  . Number of children: Not on file  . Years of education: Not on file  . Highest education level: Not on file  Occupational History  . Not on file  Tobacco Use  . Smoking status: Never Smoker  . Smokeless tobacco: Never Used  Substance and Sexual Activity  . Alcohol use: No  . Drug use: No  . Sexual activity: Never    Comment: widowed  Other Topics Concern  . Not on file  Social History Narrative  . Not on file   Social Determinants of Health   Financial Resource Strain: Not on file  Food Insecurity: Not on file  Transportation Needs: Not on file  Physical Activity: Not on file  Stress: Not on file  Social Connections: Not on file  Intimate Partner Violence: Not on file     Review of Systems  All other systems reviewed and are negative.      Objective:   Physical Exam Constitutional:      Appearance: Normal appearance. She is normal weight.  Cardiovascular:     Rate and Rhythm: Normal rate and regular rhythm.  Pulmonary:     Effort: Pulmonary effort is normal.     Breath sounds: Normal breath sounds.  Neurological:     Mental Status: She is alert.           Assessment & Plan:  Stage 3b chronic kidney disease ([place]) - Plan: CBC with Differential/Platelet, COMPLETE METABOLIC PANEL WITH GFR,  Lipid panel  Alzheimer's dementia without behavioral disturbance, unspecified timing of dementia onset ([place]) - Plan: CBC with Differential/Platelet, COMPLETE METABOLIC PANEL WITH GFR, Lipid panel  History of GI bleed - Plan: CBC with Differential/Platelet, COMPLETE METABOLIC PANEL WITH GFR, Lipid panel  Given her dementia, I want to try to simplify her medication list. I recommended discontinuation of gabapentin due to potential negative effects on her balance and also on her level of alertness. Instead have asked the daughter to give her Tylenol 500 mg every 8 hours as needed for pain. Of also recommended she reduce the dose of  tramadol if possible but she is only using that sparingly anyway. Given her age also question if she has benefited from taking atorvastatin so we will stop that. In the future we may revisit whether she needs citalopram although this would be harder to tell objectively if she benefits from the medication. I will check a CBC today, CMP, and a lipid panel. My only goal in the lipid panel is to see if her cholesterol is extremely high and would warrant staying on the Lipitor but I suspect not.

## 2020-05-24 LAB — CBC WITH DIFFERENTIAL/PLATELET
Absolute Monocytes: 470 cells/uL (ref 200–950)
Basophils Absolute: 49 cells/uL (ref 0–200)
Basophils Relative: 0.8 %
Eosinophils Absolute: 421 cells/uL (ref 15–500)
Eosinophils Relative: 6.9 %
HCT: 43.1 % (ref 35.0–45.0)
Hemoglobin: 14.4 g/dL (ref 11.7–15.5)
Lymphs Abs: 1464 cells/uL (ref 850–3900)
MCH: 29.5 pg (ref 27.0–33.0)
MCHC: 33.4 g/dL (ref 32.0–36.0)
MCV: 88.3 fL (ref 80.0–100.0)
MPV: 10 fL (ref 7.5–12.5)
Monocytes Relative: 7.7 %
Neutro Abs: 3697 cells/uL (ref 1500–7800)
Neutrophils Relative %: 60.6 %
Platelets: 409 10*3/uL — ABNORMAL HIGH (ref 140–400)
RBC: 4.88 10*6/uL (ref 3.80–5.10)
RDW: 12.9 % (ref 11.0–15.0)
Total Lymphocyte: 24 %
WBC: 6.1 10*3/uL (ref 3.8–10.8)

## 2020-05-24 LAB — LIPID PANEL
Cholesterol: 149 mg/dL (ref <200)
HDL: 65 mg/dL (ref >=50)
LDL Cholesterol (Calc): 66 mg/dL (calc)
Non-HDL Cholesterol (Calc): 84 mg/dL (calc) (ref <130)
Total CHOL/HDL Ratio: 2.3 (calc) (ref <5.0)
Triglycerides: 95 mg/dL (ref <150)

## 2020-05-24 LAB — COMPLETE METABOLIC PANEL WITH GFR
AG Ratio: 1.6 (calc) (ref 1.0–2.5)
ALT: 27 U/L (ref 6–29)
AST: 19 U/L (ref 10–35)
Albumin: 4.1 g/dL (ref 3.6–5.1)
Alkaline phosphatase (APISO): 110 U/L (ref 37–153)
BUN/Creatinine Ratio: 13 (calc) (ref 6–22)
BUN: 24 mg/dL (ref 7–25)
CO2: 25 mmol/L (ref 20–32)
Calcium: 9.4 mg/dL (ref 8.6–10.4)
Chloride: 106 mmol/L (ref 98–110)
Creat: 1.8 mg/dL — ABNORMAL HIGH (ref 0.60–0.88)
GFR, Est African American: 29 mL/min/{1.73_m2} — ABNORMAL LOW (ref >=60)
GFR, Est Non African American: 25 mL/min/{1.73_m2} — ABNORMAL LOW (ref >=60)
Globulin: 2.5 g/dL (calc) (ref 1.9–3.7)
Glucose, Bld: 148 mg/dL — ABNORMAL HIGH (ref 65–99)
Potassium: 4.5 mmol/L (ref 3.5–5.3)
Sodium: 142 mmol/L (ref 135–146)
Total Bilirubin: 0.4 mg/dL (ref 0.2–1.2)
Total Protein: 6.6 g/dL (ref 6.1–8.1)

## 2020-05-27 ENCOUNTER — Other Ambulatory Visit: Payer: Self-pay | Admitting: *Deleted

## 2020-05-30 ENCOUNTER — Other Ambulatory Visit: Payer: Self-pay | Admitting: Family Medicine

## 2020-06-06 ENCOUNTER — Other Ambulatory Visit: Payer: Medicare PPO

## 2020-06-06 ENCOUNTER — Other Ambulatory Visit: Payer: Self-pay

## 2020-06-06 ENCOUNTER — Other Ambulatory Visit: Payer: Self-pay | Admitting: Family Medicine

## 2020-06-06 DIAGNOSIS — N289 Disorder of kidney and ureter, unspecified: Secondary | ICD-10-CM

## 2020-06-07 ENCOUNTER — Other Ambulatory Visit: Payer: Self-pay | Admitting: *Deleted

## 2020-06-07 DIAGNOSIS — N289 Disorder of kidney and ureter, unspecified: Secondary | ICD-10-CM

## 2020-06-07 DIAGNOSIS — N179 Acute kidney failure, unspecified: Secondary | ICD-10-CM

## 2020-06-07 DIAGNOSIS — H25013 Cortical age-related cataract, bilateral: Secondary | ICD-10-CM | POA: Diagnosis not present

## 2020-06-07 DIAGNOSIS — H2513 Age-related nuclear cataract, bilateral: Secondary | ICD-10-CM | POA: Diagnosis not present

## 2020-06-07 DIAGNOSIS — H524 Presbyopia: Secondary | ICD-10-CM | POA: Diagnosis not present

## 2020-06-07 DIAGNOSIS — H02102 Unspecified ectropion of right lower eyelid: Secondary | ICD-10-CM | POA: Diagnosis not present

## 2020-06-07 LAB — BASIC METABOLIC PANEL WITH GFR
BUN/Creatinine Ratio: 14 (calc) (ref 6–22)
BUN: 26 mg/dL — ABNORMAL HIGH (ref 7–25)
CO2: 21 mmol/L (ref 20–32)
Calcium: 9.6 mg/dL (ref 8.6–10.4)
Chloride: 104 mmol/L (ref 98–110)
Creat: 1.87 mg/dL — ABNORMAL HIGH (ref 0.60–0.88)
GFR, Est African American: 28 mL/min/{1.73_m2} — ABNORMAL LOW (ref >=60)
GFR, Est Non African American: 24 mL/min/{1.73_m2} — ABNORMAL LOW (ref >=60)
Glucose, Bld: 92 mg/dL (ref 65–99)
Potassium: 4.3 mmol/L (ref 3.5–5.3)
Sodium: 141 mmol/L (ref 135–146)

## 2020-06-09 ENCOUNTER — Other Ambulatory Visit: Payer: Self-pay

## 2020-06-09 ENCOUNTER — Other Ambulatory Visit: Payer: Medicare PPO

## 2020-06-09 DIAGNOSIS — N179 Acute kidney failure, unspecified: Secondary | ICD-10-CM

## 2020-06-09 DIAGNOSIS — N289 Disorder of kidney and ureter, unspecified: Secondary | ICD-10-CM | POA: Diagnosis not present

## 2020-06-10 ENCOUNTER — Other Ambulatory Visit: Payer: Medicare PPO

## 2020-06-10 DIAGNOSIS — N289 Disorder of kidney and ureter, unspecified: Secondary | ICD-10-CM | POA: Diagnosis not present

## 2020-06-10 DIAGNOSIS — N179 Acute kidney failure, unspecified: Secondary | ICD-10-CM | POA: Diagnosis not present

## 2020-06-11 LAB — URINALYSIS, ROUTINE W REFLEX MICROSCOPIC
Bilirubin Urine: NEGATIVE
Glucose, UA: NEGATIVE
Hgb urine dipstick: NEGATIVE
Ketones, ur: NEGATIVE
Leukocytes,Ua: NEGATIVE
Nitrite: NEGATIVE
Protein, ur: NEGATIVE
Specific Gravity, Urine: 1.014 (ref 1.001–1.03)
pH: <=5 (ref 5.0–8.0)

## 2020-06-13 ENCOUNTER — Ambulatory Visit (HOSPITAL_COMMUNITY): Admission: RE | Admit: 2020-06-13 | Payer: Medicare PPO | Source: Ambulatory Visit

## 2020-06-13 LAB — PROTEIN ELECTROPHORESIS, SERUM
Albumin ELP: 4.6 g/dL (ref 3.8–4.8)
Alpha 1: 0.3 g/dL (ref 0.2–0.3)
Alpha 2: 0.9 g/dL (ref 0.5–0.9)
Beta 2: 0.2 g/dL (ref 0.2–0.5)
Beta Globulin: 0.5 g/dL (ref 0.4–0.6)
Gamma Globulin: 0.8 g/dL (ref 0.8–1.7)
Total Protein: 7.3 g/dL (ref 6.1–8.1)

## 2020-06-14 DIAGNOSIS — K439 Ventral hernia without obstruction or gangrene: Secondary | ICD-10-CM | POA: Diagnosis not present

## 2020-06-17 ENCOUNTER — Ambulatory Visit (HOSPITAL_COMMUNITY)
Admission: RE | Admit: 2020-06-17 | Discharge: 2020-06-17 | Disposition: A | Discharge status: Home / Self Care | Payer: Medicare PPO | Source: Ambulatory Visit | Attending: Family Medicine | Admitting: Family Medicine

## 2020-06-17 ENCOUNTER — Other Ambulatory Visit: Payer: Self-pay

## 2020-06-17 DIAGNOSIS — N289 Disorder of kidney and ureter, unspecified: Secondary | ICD-10-CM | POA: Insufficient documentation

## 2020-06-17 DIAGNOSIS — N281 Cyst of kidney, acquired: Secondary | ICD-10-CM | POA: Diagnosis not present

## 2020-06-17 DIAGNOSIS — N179 Acute kidney failure, unspecified: Secondary | ICD-10-CM | POA: Diagnosis not present

## 2020-06-23 DIAGNOSIS — H2512 Age-related nuclear cataract, left eye: Secondary | ICD-10-CM | POA: Diagnosis not present

## 2020-06-23 DIAGNOSIS — H25812 Combined forms of age-related cataract, left eye: Secondary | ICD-10-CM | POA: Diagnosis not present

## 2020-06-23 DIAGNOSIS — H25012 Cortical age-related cataract, left eye: Secondary | ICD-10-CM | POA: Diagnosis not present

## 2020-06-23 DIAGNOSIS — H21562 Pupillary abnormality, left eye: Secondary | ICD-10-CM | POA: Diagnosis not present

## 2020-07-21 NOTE — Progress Notes (Signed)
Subjective:   [name] is a 87 y.o. female who presents for Medicare Annual (Subsequent) preventive examination.  Review of Systems    N/A  Cardiac Risk Factors include: advanced age (>53men, >66 women);hypertension;dyslipidemia     Objective:    Today's Vitals   07/22/20 0957 07/22/20 1003  BP: 112/80   Pulse: (!) 107   Temp: 98.6 F (37 C)   TempSrc: Oral   SpO2: 96%   Weight: 162 lb (73.5 kg)   Height: 5\' 2"  (1.575 m)   PainSc:  10-Worst pain ever   Body mass index is 29.63 kg/m.  Advanced Directives 07/22/2020 05/26/2016 09/28/2015 09/09/2015 09/04/2015 09/03/2015 05/22/2014  Does Patient Have a Medical Advance Directive? Yes No Yes Yes Yes No Yes  Type of Paramedic of [place];Living will - - Living will Living will - Living will  Does patient want to make changes to medical advance directive? No - Patient declined - - - No - Patient declined - No - Patient declined  Copy of [place] in Chart? No - copy requested - No - copy requested No - copy requested No - copy requested - No - copy requested  Would patient like information on creating a medical advance directive? - - No - patient declined information - No - patient declined information No - patient declined information -    Current Medications (verified) Outpatient Encounter Medications as of 07/22/2020  Medication Sig  . albuterol (PROVENTIL HFA;VENTOLIN HFA) 108 (90 BASE) MCG/ACT inhaler Inhale 2 puffs into the lungs every 4 (four) hours as needed for wheezing or shortness of breath.  Marland Kitchen amLODipine (NORVASC) 10 MG tablet TAKE 1 TABLET BY MOUTH EVERY DAY  . Biotin 2500 MCG CAPS Take 2,500 mg by mouth daily.  . citalopram (CELEXA) 20 MG tablet TAKE 1 TABLET DAILY  . donepezil (ARICEPT) 10 MG tablet TAKE ONE TABLET BY MOUTH EVERY NIGHT AT BEDTIME  . ferrous sulfate 325 (65 FE) MG tablet Take 325 mg by mouth 2 (two) times daily with a meal.  . memantine (NAMENDA) 10 MG  tablet TAKE 1 TABLET BY MOUTH TWICE A DAY  . metoprolol tartrate (LOPRESSOR) 25 MG tablet TAKE 1 TABLET BY MOUTH TWICE A DAY  . Multiple Vitamin (MULTIVITAMIN WITH MINERALS) TABS tablet Take 1 tablet by mouth daily.  . pantoprazole (PROTONIX) 40 MG tablet TAKE 1 TABLET BY MOUTH EVERY DAY  . traMADol (ULTRAM) 50 MG tablet TAKE 1 TABLET BY MOUTH EVERY 8 HOURS AS NEEDED FOR SEVERE PAIN.  Marland Kitchen vitamin B-12 (CYANOCOBALAMIN) 500 MCG tablet Take 500 mcg by mouth daily.  Marland Kitchen albuterol (PROVENTIL) (2.5 MG/3ML) 0.083% nebulizer solution Take 3 mLs (2.5 mg total) by nebulization every 6 (six) hours as needed for wheezing or shortness of breath. (Patient not taking: Reported on 07/22/2020)  . [DISCONTINUED] gabapentin (NEURONTIN) 300 MG capsule TAKE 1 CAPSULE BY MOUTH THREE TIMES A DAY (Patient not taking: Reported on 07/22/2020)   No facility-administered encounter medications on file as of 07/22/2020.    Allergies (verified) Demerol [meperidine] and Meperidine hcl   History: Past Medical History:  Diagnosis Date  . CKD (chronic kidney disease) stage 3, GFR 30-59 ml/min (HCC)   . Dementia ([place])   . Hypercholesterolemia   . Hypertension   . Mild acid reflux   . Mitral valve prolapse   . Renal insufficiency   . Spinal stenosis   . Staph infection     history of a wound infection  following a previous right hip surgery  . Upper GI bleed    duodenal ulcer (09/2015) s/p endo clipping   Past Surgical History:  Procedure Laterality Date  . ABCESS DRAINAGE    . ABDOMINAL HYSTERECTOMY    . CATARACT EXTRACTION Left   . COLONOSCOPY  04/30/01   LOV:FIEPPIRJ hemorrhoids, otherwise, normal rectum/colon/Normal terminal ileum  . COLONOSCOPY  03/02/2007   JOA:CZYS sigmoid colon diverticula.  Otherwise no polyps, masses/Normal terminal ileum.  Approximately 10-15 cm of the distal terminal ileum visualized/normal view of the rectum  . ESOPHAGOGASTRODUODENOSCOPY    03/01/2007   AYT:KZSWFU esophagus without evidence of  Barrett's, mass/Mild erythema in the antrum.  Distorted pylorus consistent with prior ulcer disease.  Biopsies obtained/ 3. Junction of D1 and D2 erythematous, edematous, and strictured.  The lumen was narrowed to approximately 10 mm.   . ESOPHAGOGASTRODUODENOSCOPY N/A 09/09/2015   Procedure: ESOPHAGOGASTRODUODENOSCOPY (EGD);  Surgeon: Jerene Bears, MD;  Location: [facility] ENDOSCOPY;  Service: Endoscopy;  Laterality: N/A;  . Left total hip replacement arthroplasty  June of 2001  . Right breast lumpectomy    . Right total hip replacement arthroplasty  August of 2001  . TOTAL KNEE ARTHROPLASTY     Family History  Problem Relation Age of Onset  . Colon cancer Neg Hx    Social History   Socioeconomic History  . Marital status: Widowed    Spouse name: Not on file  . Number of children: Not on file  . Years of education: Not on file  . Highest education level: Not on file  Occupational History  . Not on file  Tobacco Use  . Smoking status: Never Smoker  . Smokeless tobacco: Never Used  Substance and Sexual Activity  . Alcohol use: No  . Drug use: No  . Sexual activity: Never    Comment: widowed  Other Topics Concern  . Not on file  Social History Narrative  . Not on file   Social Determinants of Health   Financial Resource Strain: Low Risk   . Difficulty of Paying Living Expenses: Not hard at all  Food Insecurity: No Food Insecurity  . Worried About Charity fundraiser in the Last Year: Never true  . Ran Out of Food in the Last Year: Never true  Transportation Needs: No Transportation Needs  . Lack of Transportation (Medical): No  . Lack of Transportation (Non-Medical): No  Physical Activity: Sufficiently Active  . Days of Exercise per Week: 7 days  . Minutes of Exercise per Session: 30 min  Stress: No Stress Concern Present  . Feeling of Stress : Not at all  Social Connections: Socially Isolated  . Frequency of Communication with Friends and Family: More than three times a week   . Frequency of Social Gatherings with Friends and Family: More than three times a week  . Attends Religious Services: Never  . Active Member of Clubs or Organizations: No  . Attends Archivist Meetings: Never  . Marital Status: Widowed    Tobacco Counseling Counseling given: Not Answered   Clinical Intake:  Pre-visit preparation completed: Yes  Pain : 0-10 Pain Score: 10-Worst pain ever Pain Type: Chronic pain Pain Location:  (diffused) Pain Descriptors / Indicators: Aching Pain Onset: In the past 7 days Pain Frequency: Constant     Nutritional Risks: Nausea/ vomitting/ diarrhea (Nausea, Diarrhea) Diabetes: No  How often do you need to have someone help you when you read instructions, pamphlets, or other written materials from your  doctor or pharmacy?: 3 - Sometimes  Diabetic?No   Interpreter Needed?: No  Information entered by :: [place] of Daily Living In your present state of health, do you have any difficulty performing the following activities: 07/22/2020  Hearing? Y  Comment has bilateral hearing aids  Vision? N  Difficulty concentrating or making decisions? Y  Walking or climbing stairs? Y  Dressing or bathing? Y  Comment needs some assistance with bathing  Doing errands, shopping? Y  Preparing Food and eating ? N  Using the Toilet? N  In the past six months, have you accidently leaked urine? Y  Comment Currently wears depends  Do you have problems with loss of bowel control? Y  Managing your Medications? Y  Managing your Finances? Y  Housekeeping or managing your Housekeeping? N  Some recent data might be hidden    Patient Care Team: Susy Frizzle, MD as PCP - General (Family Medicine) Danie Binder, MD (Inactive) (Gastroenterology)  Indicate any recent Medical Services you may have received from other than [facility] providers in the past year (date may be approximate).     Assessment:   This is a routine wellness  examination for [name].  Hearing/Vision screen  Hearing Screening   125Hz  250Hz  500Hz  1000Hz  2000Hz  3000Hz  4000Hz  6000Hz  8000Hz   Right ear:           Left ear:           Vision Screening Comments: Patient gets eye exam once per year. Had recent left eye cataract surgery    Dietary issues and exercise activities discussed: Current Exercise Habits: Home exercise routine, Type of exercise: walking;stretching, Time (Minutes): 30, Frequency (Times/Week): 7, Weekly Exercise (Minutes/Week): 210, Intensity: Mild, Exercise limited by: psychological condition(s);respiratory conditions(s)  Goals Addressed            This Visit's Progress   . Exercise 3x per week (30 min per time)      . Prevent falls        Depression Screen PHQ 2/9 Scores 07/22/2020 12/01/2019 01/15/2017 07/02/2013  PHQ - 2 Score 0 0 0 0    Fall Risk Fall Risk  07/22/2020 05/23/2020 12/01/2019 01/15/2017 07/02/2013  Falls in the past year? 0 0 0 No No  Number falls in past yr: 0 0 - - -  Injury with Fall? 0 0 - - -  Risk for fall due to : Impaired balance/gait - - - -  Follow up Falls evaluation completed;Falls prevention discussed - - - -    FALL RISK PREVENTION PERTAINING TO THE HOME:  Any stairs in or around the home? Yes  If so, are there any without handrails? No  Home free of loose throw rugs in walkways, pet beds, electrical cords, etc? Yes  Adequate lighting in your home to reduce risk of falls? Yes   ASSISTIVE DEVICES UTILIZED TO PREVENT FALLS:  Life alert? No  Use of a cane, walker or w/c? No  Grab bars in the bathroom? No  Shower chair or bench in shower? No  Elevated toilet seat or a handicapped toilet? No   TIMED UP AND GO:  Was the test performed? Yes .  Length of time to ambulate 10 feet: 5 sec.   Gait slow and steady without use of assistive device  Cognitive Function:   Cognitive status assessed by direct observation. Patient has current diagnosis of cognitive impairment. Patient is unable to  complete screening 6CIT or MMSE.  Immunizations Immunization History  Administered Date(s) Administered  . Influenza,inj,Quad PF,6+ Mos 01/14/2013  . Pneumococcal Conjugate-13 07/02/2013  . Pneumococcal Polysaccharide-23 03/20/2007  . Tdap 01/04/2014, 05/26/2016    TDAP status: Up to date  Flu Vaccine status: Declined, Education has been provided regarding the importance of this vaccine but patient still declined. Advised may receive this vaccine at local pharmacy or Health Dept. Aware to provide a copy of the vaccination record if obtained from local pharmacy or Health Dept. Verbalized acceptance and understanding.  Pneumococcal vaccine status: Up to date  Covid-19 vaccine status: Declined, Education has been provided regarding the importance of this vaccine but patient still declined. Advised may receive this vaccine at local pharmacy or Health Dept.or vaccine clinic. Aware to provide a copy of the vaccination record if obtained from local pharmacy or Health Dept. Verbalized acceptance and understanding.  Qualifies for Shingles Vaccine? Yes   Zostavax completed No   Shingrix Completed?: No.    Education has been provided regarding the importance of this vaccine. Patient has been advised to call insurance company to determine out of pocket expense if they have not yet received this vaccine. Advised may also receive vaccine at local pharmacy or Health Dept. Verbalized acceptance and understanding.  Screening Tests Health Maintenance  Topic Date Due  . COVID-19 Vaccine (1) 12/16/2020 (Originally 08/23/1937)  . INFLUENZA VACCINE  10/17/2020  . TETANUS/TDAP  05/27/2026  . DEXA SCAN  Completed  . PNA vac Low Risk Adult  Completed  . HPV VACCINES  Aged Out    Health Maintenance  There are no preventive care reminders to display for this patient.  Colorectal cancer screening: No longer required.   Mammogram status: No longer required due to age.  Bone Density status:  Completed 10/10/2009. Results reflect: Bone density results: NORMAL. Repeat every 0 years.  Lung Cancer Screening: (Low Dose CT Chest recommended if Age 52-80 years, 30 pack-year currently smoking OR have quit w/in 15years.) does not qualify.   Lung Cancer Screening Referral: N/A   Additional Screening:  Hepatitis C Screening: does not qualify;   Vision Screening: Recommended annual ophthalmology exams for early detection of glaucoma and other disorders of the eye. Is the patient up to date with their annual eye exam?  Yes  Who is the provider or what is the name of the office in which the patient attends annual eye exams? Dr. Satira Sark If pt is not established with a provider, would they like to be referred to a provider to establish care? No .   Dental Screening: Recommended annual dental exams for proper oral hygiene  Community Resource Referral / Chronic Care Management: CRR required this visit?  No   CCM required this visit?  No      Plan:     I have personally reviewed and noted the following in the patient's chart:   . Medical and social history . Use of alcohol, tobacco or illicit drugs  . Current medications and supplements including opioid prescriptions.  . Functional ability and status . Nutritional status . Physical activity . Advanced directives . List of other physicians . Hospitalizations, surgeries, and ER visits in previous 12 months . Vitals . Screenings to include cognitive, depression, and falls . Referrals and appointments  In addition, I have reviewed and discussed with patient certain preventive protocols, quality metrics, and best practice recommendations. A written personalized care plan for preventive services as well as general preventive health recommendations were provided to patient.  Ofilia Neas, LPN   [id number]   Nurse Notes: None

## 2020-07-22 ENCOUNTER — Other Ambulatory Visit: Payer: Self-pay

## 2020-07-22 ENCOUNTER — Ambulatory Visit (INDEPENDENT_AMBULATORY_CARE_PROVIDER_SITE_OTHER): Payer: Medicare PPO

## 2020-07-22 VITALS — BP 112/80 | HR 107 | Temp 98.6°F | Ht 62.0 in | Wt 162.0 lb

## 2020-07-22 DIAGNOSIS — Z0001 Encounter for general adult medical examination with abnormal findings: Secondary | ICD-10-CM | POA: Diagnosis not present

## 2020-07-22 DIAGNOSIS — Z0000 Encounter for general adult medical examination without abnormal findings: Secondary | ICD-10-CM

## 2020-07-22 NOTE — Patient Instructions (Signed)
[name] , Thank you for taking time to come for your Medicare Wellness Visit. I appreciate your ongoing commitment to your health goals. Please review the following plan we discussed and let me know if I can assist you in the future.   Screening recommendations/referrals: Colonoscopy: No longer required  Mammogram: No longer required  Bone Density: No longer required  Recommended yearly ophthalmology/optometry visit for glaucoma screening and checkup Recommended yearly dental visit for hygiene and checkup  Vaccinations: Influenza vaccine: Patient declined  Pneumococcal vaccine: Completed series  Tdap vaccine: Up to date, next 05/27/2026 Shingles vaccine: Currently due for shingrix, if you would like to receive we recommend you get it at your local pharmacy.     Advanced directives: Please bring in a copy of your advanced medical directives so that we may scan into your chart.  Conditions/risks identified: None   Next appointment: None   Preventive Care 65 Years and Older, Female Preventive care refers to lifestyle choices and visits with your health care provider that can promote health and wellness. What does preventive care include?  A yearly physical exam. This is also called an annual well check.  Dental exams once or twice a year.  Routine eye exams. Ask your health care provider how often you should have your eyes checked.  Personal lifestyle choices, including:  Daily care of your teeth and gums.  Regular physical activity.  Eating a healthy diet.  Avoiding tobacco and drug use.  Limiting alcohol use.  Practicing safe sex.  Taking low-dose aspirin every day.  Taking vitamin and mineral supplements as recommended by your health care provider. What happens during an annual well check? The services and screenings done by your health care provider during your annual well check will depend on your age, overall health, lifestyle risk factors, and family  history of disease. Counseling  Your health care provider may ask you questions about your:  Alcohol use.  Tobacco use.  Drug use.  Emotional well-being.  Home and relationship well-being.  Sexual activity.  Eating habits.  History of falls.  Memory and ability to understand (cognition).  Work and work Statistician.  Reproductive health. Screening  You may have the following tests or measurements:  Height, weight, and BMI.  Blood pressure.  Lipid and cholesterol levels. These may be checked every 5 years, or more frequently if you are over 30 years old.  Skin check.  Lung cancer screening. You may have this screening every year starting at age 80 if you have a 30-pack-year history of smoking and currently smoke or have quit within the past 15 years.  Fecal occult blood test (FOBT) of the stool. You may have this test every year starting at age 55.  Flexible sigmoidoscopy or colonoscopy. You may have a sigmoidoscopy every 5 years or a colonoscopy every 10 years starting at age 17.  Hepatitis C blood test.  Hepatitis B blood test.  Sexually transmitted disease (STD) testing.  Diabetes screening. This is done by checking your blood sugar (glucose) after you have not eaten for a while (fasting). You may have this done every 1-3 years.  Bone density scan. This is done to screen for osteoporosis. You may have this done starting at age 37.  Mammogram. This may be done every 1-2 years. Talk to your health care provider about how often you should have regular mammograms. Talk with your health care provider about your test results, treatment options, and if necessary, the need for more tests. Vaccines  Your health care provider may recommend certain vaccines, such as:  Influenza vaccine. This is recommended every year.  Tetanus, diphtheria, and acellular pertussis (Tdap, Td) vaccine. You may need a Td booster every 10 years.  Zoster vaccine. You may need this after  age 66.  Pneumococcal 13-valent conjugate (PCV13) vaccine. One dose is recommended after age 14.  Pneumococcal polysaccharide (PPSV23) vaccine. One dose is recommended after age 74. Talk to your health care provider about which screenings and vaccines you need and how often you need them. This information is not intended to replace advice given to you by your health care provider. Make sure you discuss any questions you have with your health care provider. Document Released: 04/01/2015 Document Revised: 11/23/2015 Document Reviewed: 01/04/2015 Elsevier Interactive Patient Education  2017 [employer] Prevention in the Home Falls can cause injuries. They can happen to people of all ages. There are many things you can do to make your home safe and to help prevent falls. What can I do on the outside of my home?  Regularly fix the edges of walkways and driveways and fix any cracks.  Remove anything that might make you trip as you walk through a door, such as a raised step or threshold.  Trim any bushes or trees on the path to your home.  Use bright outdoor lighting.  Clear any walking paths of anything that might make someone trip, such as rocks or tools.  Regularly check to see if handrails are loose or broken. Make sure that both sides of any steps have handrails.  Any raised decks and porches should have guardrails on the edges.  Have any leaves, snow, or ice cleared regularly.  Use sand or salt on walking paths during winter.  Clean up any spills in your garage right away. This includes oil or grease spills. What can I do in the bathroom?  Use night lights.  Install grab bars by the toilet and in the tub and shower. Do not use towel bars as grab bars.  Use non-skid mats or decals in the tub or shower.  If you need to sit down in the shower, use a plastic, non-slip stool.  Keep the floor dry. Clean up any water that spills on the floor as soon as it  happens.  Remove soap buildup in the tub or shower regularly.  Attach bath mats securely with double-sided non-slip rug tape.  Do not have throw rugs and other things on the floor that can make you trip. What can I do in the bedroom?  Use night lights.  Make sure that you have a light by your bed that is easy to reach.  Do not use any sheets or blankets that are too big for your bed. They should not hang down onto the floor.  Have a firm chair that has side arms. You can use this for support while you get dressed.  Do not have throw rugs and other things on the floor that can make you trip. What can I do in the kitchen?  Clean up any spills right away.  Avoid walking on wet floors.  Keep items that you use a lot in easy-to-reach places.  If you need to reach something above you, use a strong step stool that has a grab bar.  Keep electrical cords out of the way.  Do not use floor polish or wax that makes floors slippery. If you must use wax, use non-skid floor wax.  Do  not have throw rugs and other things on the floor that can make you trip. What can I do with my stairs?  Do not leave any items on the stairs.  Make sure that there are handrails on both sides of the stairs and use them. Fix handrails that are broken or loose. Make sure that handrails are as long as the stairways.  Check any carpeting to make sure that it is firmly attached to the stairs. Fix any carpet that is loose or worn.  Avoid having throw rugs at the top or bottom of the stairs. If you do have throw rugs, attach them to the floor with carpet tape.  Make sure that you have a light switch at the top of the stairs and the bottom of the stairs. If you do not have them, ask someone to add them for you. What else can I do to help prevent falls?  Wear shoes that:  Do not have high heels.  Have rubber bottoms.  Are comfortable and fit you well.  Are closed at the toe. Do not wear sandals.  If you  use a stepladder:  Make sure that it is fully opened. Do not climb a closed stepladder.  Make sure that both sides of the stepladder are locked into place.  Ask someone to hold it for you, if possible.  Clearly mark and make sure that you can see:  Any grab bars or handrails.  First and last steps.  Where the edge of each step is.  Use tools that help you move around (mobility aids) if they are needed. These include:  Canes.  Walkers.  Scooters.  Crutches.  Turn on the lights when you go into a dark area. Replace any light bulbs as soon as they burn out.  Set up your furniture so you have a clear path. Avoid moving your furniture around.  If any of your floors are uneven, fix them.  If there are any pets around you, be aware of where they are.  Review your medicines with your doctor. Some medicines can make you feel dizzy. This can increase your chance of falling. Ask your doctor what other things that you can do to help prevent falls. This information is not intended to replace advice given to you by your health care provider. Make sure you discuss any questions you have with your health care provider. Document Released: 12/30/2008 Document Revised: 08/11/2015 Document Reviewed: 04/09/2014 Elsevier Interactive Patient Education  2017 [employer].

## 2020-09-26 ENCOUNTER — Other Ambulatory Visit: Payer: Self-pay

## 2020-09-26 ENCOUNTER — Ambulatory Visit: Payer: Medicare PPO | Admitting: Nurse Practitioner

## 2020-09-26 ENCOUNTER — Encounter: Payer: Self-pay | Admitting: Nurse Practitioner

## 2020-09-26 VITALS — BP 140/88 | HR 66 | Temp 98.5°F | Wt 161.4 lb

## 2020-09-26 DIAGNOSIS — R197 Diarrhea, unspecified: Secondary | ICD-10-CM

## 2020-09-26 NOTE — Progress Notes (Signed)
Subjective:    Patient ID: [name], female    DOB: [birth year], 87 y.o.   MRN: [id number]  HPI: [name] is a 87 y.o. female presenting with daughter for headache and high blood pressure.   Chief Complaint  Patient presents with   Hypertension   Headache    The patient is able to give only limited history due to history of dementia.  The daughter provides most of the history.  Daughter reports she and her husband have also been sick for the past few weeks.  They ate a whole jar of Jif peanut butter before they knew it may be in the batch with salmonella.  HYPERTENSION Currently taking amlodipine 10 mg daily along with metoprolol 25 mg twice daily.   Hypertension status: stable  Satisfied with current treatment? yes Duration of hypertension: chronic BP monitoring frequency:  daily BP range: 130/80 BP medication side effects:  no Medication compliance: excellent Aspirin: no Recurrent headaches: yes Visual changes: no Palpitations: no Dyspnea: no Chest pain: no Lower extremity edema: no Dizzy/lightheaded: no  The patient denies abdominal pain, congestion, cough, sore throat, fevers.  She has been eating mostly normal, however has been having some intermittent diarrhea and headaches.  Allergies  Allergen Reactions   Demerol [Meperidine] Other (See Comments)    Altered mental status   Meperidine Hcl Other (See Comments)    Altered mental status    Outpatient Encounter Medications as of 09/26/2020  Medication Sig   albuterol (PROVENTIL HFA;VENTOLIN HFA) 108 (90 BASE) MCG/ACT inhaler Inhale 2 puffs into the lungs every 4 (four) hours as needed for wheezing or shortness of breath.   albuterol (PROVENTIL) (2.5 MG/3ML) 0.083% nebulizer solution Take 3 mLs (2.5 mg total) by nebulization every 6 (six) hours as needed for wheezing or shortness of breath. (Patient not taking: Reported on 07/22/2020)   amLODipine (NORVASC) 10 MG tablet TAKE 1 TABLET BY MOUTH EVERY DAY    Biotin 2500 MCG CAPS Take 2,500 mg by mouth daily.   citalopram (CELEXA) 20 MG tablet TAKE 1 TABLET DAILY   donepezil (ARICEPT) 10 MG tablet TAKE ONE TABLET BY MOUTH EVERY NIGHT AT BEDTIME   ferrous sulfate 325 (65 FE) MG tablet Take 325 mg by mouth 2 (two) times daily with a meal.   memantine (NAMENDA) 10 MG tablet TAKE 1 TABLET BY MOUTH TWICE A DAY   metoprolol tartrate (LOPRESSOR) 25 MG tablet TAKE 1 TABLET BY MOUTH TWICE A DAY   Multiple Vitamin (MULTIVITAMIN WITH MINERALS) TABS tablet Take 1 tablet by mouth daily.   pantoprazole (PROTONIX) 40 MG tablet TAKE 1 TABLET BY MOUTH EVERY DAY   traMADol (ULTRAM) 50 MG tablet TAKE 1 TABLET BY MOUTH EVERY 8 HOURS AS NEEDED FOR SEVERE PAIN.   vitamin B-12 (CYANOCOBALAMIN) 500 MCG tablet Take 500 mcg by mouth daily.   No facility-administered encounter medications on file as of 09/26/2020.    Patient Active Problem List   Diagnosis Date Noted   Dementia ([place])    Upper GI bleed    Aspiration pneumonia (HCC)    Leukocytosis    Melena    Acute blood loss anemia    Duodenal ulcer hemorrhage    Hypoxemia 09/03/2015   Hypokalemia 09/03/2015   Asthma exacerbation 09/03/2015   Encounter for screening colonoscopy 09/24/2013   Elevated LFTs 09/24/2013   CKD (chronic kidney disease) stage 3, GFR 30-59 ml/min (HCC)    Spinal stenosis    Food impaction of esophagus 08/02/2011  GERD 09/23/2009   RENAL INSUFFICIENCY 09/23/2009   GASTROINTESTINAL HEMORRHAGE, HX OF 08/13/2008    Past Medical History:  Diagnosis Date   CKD (chronic kidney disease) stage 3, GFR 30-59 ml/min (HCC)    Dementia (HCC)    Hypercholesterolemia    Hypertension    Mild acid reflux    Mitral valve prolapse    Renal insufficiency    Spinal stenosis    Staph infection     history of a wound infection following a previous right hip surgery   Upper GI bleed    duodenal ulcer (09/2015) s/p endo clipping    Relevant past medical, surgical, family and social history  reviewed and updated as indicated. Interim medical history since our last visit reviewed.  Review of Systems Per HPI unless specifically indicated above     Objective:    BP 140/88 (BP Location: Left Arm, Patient Position: Sitting, Cuff Size: Normal)   Pulse 66   Temp 98.5 F (36.9 C) (Oral)   Wt 161 lb 6.4 oz (73.2 kg)   SpO2 95%   BMI 29.52 kg/m   Wt Readings from Last 3 Encounters:  09/26/20 161 lb 6.4 oz (73.2 kg)  07/22/20 162 lb (73.5 kg)  05/23/20 157 lb (71.2 kg)    Physical Exam Vitals and nursing note reviewed.  Constitutional:      General: She is not in acute distress.    Appearance: She is well-developed. She is not toxic-appearing.  HENT:     Head: Normocephalic and atraumatic.     Mouth/Throat:     Mouth: Mucous membranes are moist.     Pharynx: Oropharynx is clear.  Cardiovascular:     Rate and Rhythm: Normal rate.  Pulmonary:     Effort: Pulmonary effort is normal. No respiratory distress.     Breath sounds: Normal breath sounds. No wheezing, rhonchi or rales.  Abdominal:     General: Bowel sounds are normal. There is no distension.     Palpations: Abdomen is soft.     Tenderness: There is no abdominal tenderness.  Musculoskeletal:        General: No swelling. Normal range of motion.     Cervical back: Normal range of motion and neck supple. No rigidity.     Right lower leg: No edema.     Left lower leg: No edema.  Skin:    General: Skin is warm and dry.     Capillary Refill: Capillary refill takes less than 2 seconds.     Coloration: Skin is not cyanotic or pale.     Findings: No rash.  Neurological:     Mental Status: She is alert. Mental status is at baseline.  Psychiatric:        Mood and Affect: Mood normal.      Assessment & Plan:   1. Diarrhea, unspecified type Acute.  Unclear etiology.  Given similar symptoms in family members, will check stool testing for C. Diff, salmonella.  Check kidney function and electrolytes to check for  dehydration, although vital signs are stable today and blood pressure is accetable.  Unfortunately, our lab person left the office due to illness today and the patient will need to return for the testing.  I've encouraged plenty of hydration with water or sugar free Gatordate in the meantime.  Follow up pending results.  - Stool culture; Future - C. difficile GDH and Toxin A/B; Future - COMPLETE METABOLIC PANEL WITH GFR; Future   Follow up plan: Return  if symptoms worsen or fail to improve.

## 2020-09-27 ENCOUNTER — Other Ambulatory Visit: Payer: Medicare PPO

## 2020-09-27 ENCOUNTER — Encounter: Payer: Self-pay | Admitting: Nurse Practitioner

## 2020-09-28 ENCOUNTER — Other Ambulatory Visit: Payer: Self-pay

## 2020-09-28 ENCOUNTER — Other Ambulatory Visit: Payer: Medicare PPO

## 2020-09-28 DIAGNOSIS — R197 Diarrhea, unspecified: Secondary | ICD-10-CM | POA: Diagnosis not present

## 2020-09-28 DIAGNOSIS — Z8719 Personal history of other diseases of the digestive system: Secondary | ICD-10-CM

## 2020-09-28 LAB — EXTRA LAV TOP TUBE

## 2020-09-28 LAB — COMPLETE METABOLIC PANEL WITH GFR
AG Ratio: 1.8 (calc) (ref 1.0–2.5)
ALT: 30 U/L — ABNORMAL HIGH (ref 6–29)
AST: 30 U/L (ref 10–35)
Albumin: 4.3 g/dL (ref 3.6–5.1)
Alkaline phosphatase (APISO): 119 U/L (ref 37–153)
BUN/Creatinine Ratio: 15 (calc) (ref 6–22)
BUN: 25 mg/dL (ref 7–25)
CO2: 24 mmol/L (ref 20–32)
Calcium: 9.6 mg/dL (ref 8.6–10.4)
Chloride: 101 mmol/L (ref 98–110)
Creat: 1.62 mg/dL — ABNORMAL HIGH (ref 0.60–0.95)
Globulin: 2.4 g/dL (calc) (ref 1.9–3.7)
Glucose, Bld: 132 mg/dL — ABNORMAL HIGH (ref 65–99)
Potassium: 4.5 mmol/L (ref 3.5–5.3)
Sodium: 138 mmol/L (ref 135–146)
Total Bilirubin: 0.5 mg/dL (ref 0.2–1.2)
Total Protein: 6.7 g/dL (ref 6.1–8.1)
eGFR: 31 mL/min/{1.73_m2} — ABNORMAL LOW (ref >=60)

## 2020-09-30 NOTE — Progress Notes (Signed)
Spoke with pt daughter regarding labs

## 2020-10-31 ENCOUNTER — Other Ambulatory Visit: Payer: Self-pay | Admitting: Family Medicine

## 2020-12-18 ENCOUNTER — Other Ambulatory Visit: Payer: Self-pay | Admitting: Family Medicine

## 2021-01-05 ENCOUNTER — Other Ambulatory Visit: Payer: Self-pay | Admitting: Family Medicine

## 2021-02-21 ENCOUNTER — Other Ambulatory Visit: Payer: Self-pay | Admitting: Family Medicine

## 2021-02-21 ENCOUNTER — Telehealth: Payer: Self-pay

## 2021-02-21 MED ORDER — OSELTAMIVIR PHOSPHATE 30 MG PO CAPS: 30.0000 mg | ORAL_CAPSULE | Freq: Two times a day (BID) | ORAL | 0 refills | Status: DC

## 2021-02-21 NOTE — Telephone Encounter (Signed)
Pt's daughter advised. Nothing further needed.

## 2021-02-21 NOTE — Telephone Encounter (Signed)
Pt's daughter called and states she tested pos for flu yesterday. Now pt has same symptoms of body aches, diarrhea and fever (100--101). She would like to know if you would send in Tamiflu for pt.  Please advise, thanks!

## 2021-05-14 ENCOUNTER — Other Ambulatory Visit: Payer: Self-pay | Admitting: Family Medicine

## 2021-05-30 DIAGNOSIS — H02102 Unspecified ectropion of right lower eyelid: Secondary | ICD-10-CM | POA: Diagnosis not present

## 2021-05-30 DIAGNOSIS — H02105 Unspecified ectropion of left lower eyelid: Secondary | ICD-10-CM | POA: Diagnosis not present

## 2021-05-30 DIAGNOSIS — H524 Presbyopia: Secondary | ICD-10-CM | POA: Diagnosis not present

## 2021-05-30 DIAGNOSIS — H2511 Age-related nuclear cataract, right eye: Secondary | ICD-10-CM | POA: Diagnosis not present

## 2021-06-15 ENCOUNTER — Other Ambulatory Visit: Payer: Self-pay | Admitting: Family Medicine

## 2021-06-22 ENCOUNTER — Other Ambulatory Visit: Payer: Self-pay | Admitting: Family Medicine

## 2021-07-05 ENCOUNTER — Other Ambulatory Visit: Payer: Self-pay | Admitting: Family Medicine

## 2021-08-03 ENCOUNTER — Ambulatory Visit: Payer: Medicare PPO

## 2021-08-17 DIAGNOSIS — F0390 Unspecified dementia without behavioral disturbance: Secondary | ICD-10-CM | POA: Diagnosis not present

## 2021-08-17 DIAGNOSIS — R6889 Other general symptoms and signs: Secondary | ICD-10-CM | POA: Diagnosis not present

## 2021-08-17 DIAGNOSIS — F17200 Nicotine dependence, unspecified, uncomplicated: Secondary | ICD-10-CM | POA: Diagnosis not present

## 2021-08-19 ENCOUNTER — Encounter (HOSPITAL_COMMUNITY): Payer: Self-pay | Admitting: Emergency Medicine

## 2021-08-19 ENCOUNTER — Other Ambulatory Visit: Payer: Self-pay

## 2021-08-19 ENCOUNTER — Emergency Department (HOSPITAL_COMMUNITY)
Admission: EM | Admit: 2021-08-19 | Discharge: 2021-08-25 | Disposition: A | Discharge status: Home / Self Care | Payer: Medicare PPO | Attending: Emergency Medicine | Admitting: Emergency Medicine

## 2021-08-19 DIAGNOSIS — N189 Chronic kidney disease, unspecified: Secondary | ICD-10-CM | POA: Insufficient documentation

## 2021-08-19 DIAGNOSIS — R7989 Other specified abnormal findings of blood chemistry: Secondary | ICD-10-CM | POA: Insufficient documentation

## 2021-08-19 DIAGNOSIS — Z20822 Contact with and (suspected) exposure to covid-19: Secondary | ICD-10-CM | POA: Insufficient documentation

## 2021-08-19 DIAGNOSIS — R059 Cough, unspecified: Secondary | ICD-10-CM | POA: Diagnosis not present

## 2021-08-19 DIAGNOSIS — Z022 Encounter for examination for admission to residential institution: Secondary | ICD-10-CM | POA: Diagnosis not present

## 2021-08-19 DIAGNOSIS — F0390 Unspecified dementia without behavioral disturbance: Secondary | ICD-10-CM | POA: Insufficient documentation

## 2021-08-19 DIAGNOSIS — Y92129 Unspecified place in nursing home as the place of occurrence of the external cause: Secondary | ICD-10-CM | POA: Diagnosis not present

## 2021-08-19 DIAGNOSIS — Z79899 Other long term (current) drug therapy: Secondary | ICD-10-CM | POA: Diagnosis not present

## 2021-08-19 DIAGNOSIS — I129 Hypertensive chronic kidney disease with stage 1 through stage 4 chronic kidney disease, or unspecified chronic kidney disease: Secondary | ICD-10-CM | POA: Diagnosis not present

## 2021-08-19 DIAGNOSIS — G309 Alzheimer's disease, unspecified: Secondary | ICD-10-CM | POA: Insufficient documentation

## 2021-08-19 DIAGNOSIS — Z9181 History of falling: Secondary | ICD-10-CM | POA: Insufficient documentation

## 2021-08-19 DIAGNOSIS — F0280 Dementia in other diseases classified elsewhere without behavioral disturbance: Secondary | ICD-10-CM

## 2021-08-19 LAB — CBC
HCT: 39.9 % (ref 36.0–46.0)
Hemoglobin: 13 g/dL (ref 12.0–15.0)
MCH: 28.8 pg (ref 26.0–34.0)
MCHC: 32.6 g/dL (ref 30.0–36.0)
MCV: 88.3 fL (ref 80.0–100.0)
Platelets: 344 10*3/uL (ref 150–400)
RBC: 4.52 MIL/uL (ref 3.87–5.11)
RDW: 14.2 % (ref 11.5–15.5)
WBC: 8.9 10*3/uL (ref 4.0–10.5)
nRBC: 0 % (ref 0.0–0.2)

## 2021-08-19 LAB — URINALYSIS, ROUTINE W REFLEX MICROSCOPIC
Bacteria, UA: NONE SEEN
Bilirubin Urine: NEGATIVE
Glucose, UA: NEGATIVE mg/dL
Ketones, ur: NEGATIVE mg/dL
Nitrite: NEGATIVE
Protein, ur: NEGATIVE mg/dL
Specific Gravity, Urine: 1.01 (ref 1.005–1.030)
pH: 5 (ref 5.0–8.0)

## 2021-08-19 LAB — COMPREHENSIVE METABOLIC PANEL
ALT: 19 U/L (ref 0–44)
AST: 19 U/L (ref 15–41)
Albumin: 3.7 g/dL (ref 3.5–5.0)
Alkaline Phosphatase: 93 U/L (ref 38–126)
Anion gap: 9 (ref 5–15)
BUN: 32 mg/dL — ABNORMAL HIGH (ref 8–23)
CO2: 24 mmol/L (ref 22–32)
Calcium: 9 mg/dL (ref 8.9–10.3)
Chloride: 103 mmol/L (ref 98–111)
Creatinine, Ser: 1.99 mg/dL — ABNORMAL HIGH (ref 0.44–1.00)
GFR, Estimated: 24 mL/min — ABNORMAL LOW (ref >60)
Glucose, Bld: 106 mg/dL — ABNORMAL HIGH (ref 70–99)
Potassium: 4.1 mmol/L (ref 3.5–5.1)
Sodium: 136 mmol/L (ref 135–145)
Total Bilirubin: 0.6 mg/dL (ref 0.3–1.2)
Total Protein: 6.4 g/dL — ABNORMAL LOW (ref 6.5–8.1)
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
## 2021-08-20 LAB — RESP PANEL BY RT-PCR (FLU A&B, COVID) ARPGX2
Influenza A by PCR: NEGATIVE
Influenza B by PCR: NEGATIVE
SARS Coronavirus 2 by RT PCR: NEGATIVE

## 2021-08-20 MED ORDER — ALBUTEROL SULFATE HFA 108 (90 BASE) MCG/ACT IN AERS
2.0000 | INHALATION_SPRAY | RESPIRATORY_TRACT | Status: DC | PRN
Start: 2021-08-20 — End: 2021-08-20

## 2021-08-20 MED ORDER — ACETAMINOPHEN 325 MG PO TABS: 650.0000 mg | ORAL_TABLET | ORAL | Status: DC | PRN

## 2021-08-20 MED ORDER — DONEPEZIL HCL 5 MG PO TABS
10.0000 mg | ORAL_TABLET | Freq: Every day | ORAL | Status: DC
  Administered 2021-08-20 – 2021-08-24 (×5): 10 mg via ORAL
  Filled 2021-08-20 (×5): qty 2

## 2021-08-20 MED ORDER — LORAZEPAM 1 MG PO TABS
1.0000 mg | ORAL_TABLET | Freq: Once | ORAL | Status: AC
  Administered 2021-08-20: 1 mg via ORAL

## 2021-08-20 MED ORDER — LORAZEPAM 1 MG PO TABS
0.5000 mg | ORAL_TABLET | Freq: Once | ORAL | Status: AC
  Administered 2021-08-20: 0.5 mg via ORAL
  Filled 2021-08-20: qty 1

## 2021-08-20 MED ORDER — METOPROLOL TARTRATE 25 MG PO TABS
25.0000 mg | ORAL_TABLET | Freq: Two times a day (BID) | ORAL | Status: DC
  Administered 2021-08-20 (×3): 25 mg via ORAL
  Filled 2021-08-20 (×3): qty 1

## 2021-08-20 MED ORDER — LORAZEPAM 1 MG PO TABS
1.0000 mg | ORAL_TABLET | Freq: Once | ORAL | Status: AC
  Administered 2021-08-20: 1 mg via ORAL
  Filled 2021-08-20: qty 1

## 2021-08-20 MED ORDER — PANTOPRAZOLE SODIUM 40 MG PO TBEC
40.0000 mg | DELAYED_RELEASE_TABLET | Freq: Every day | ORAL | Status: DC
  Administered 2021-08-20 – 2021-08-25 (×6): 40 mg via ORAL
  Filled 2021-08-20 (×6): qty 1

## 2021-08-20 MED ORDER — LORAZEPAM 0.5 MG PO TABS
0.5000 mg | ORAL_TABLET | Freq: Four times a day (QID) | ORAL | Status: DC | PRN
  Administered 2021-08-20 – 2021-08-24 (×3): 0.5 mg via ORAL
  Filled 2021-08-20 (×6): qty 1

## 2021-08-20 MED ORDER — CITALOPRAM HYDROBROMIDE 10 MG PO TABS
20.0000 mg | ORAL_TABLET | Freq: Every day | ORAL | Status: DC
  Administered 2021-08-20: 20 mg via ORAL
  Filled 2021-08-20: qty 2

## 2021-08-20 MED ORDER — ALBUTEROL SULFATE (2.5 MG/3ML) 0.083% IN NEBU: 2.5000 mg | INHALATION_SOLUTION | RESPIRATORY_TRACT | Status: DC | PRN

## 2021-08-20 MED ORDER — AMLODIPINE BESYLATE 5 MG PO TABS
10.0000 mg | ORAL_TABLET | Freq: Every day | ORAL | Status: DC
  Administered 2021-08-20 – 2021-08-25 (×6): 10 mg via ORAL
  Filled 2021-08-20 (×6): qty 2

## 2021-08-20 MED ORDER — MEMANTINE HCL 10 MG PO TABS
10.0000 mg | ORAL_TABLET | Freq: Two times a day (BID) | ORAL | Status: DC
  Administered 2021-08-20 – 2021-08-25 (×12): 10 mg via ORAL
  Filled 2021-08-20 (×12): qty 1
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
## 2021-08-20 NOTE — ED Notes (Signed)
Remains cooperative.  Awake and alert
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
## 2021-08-20 NOTE — ED Notes (Signed)
Family at bedside. 

## 2021-08-20 NOTE — ED Notes (Addendum)
Family member asked about elevated BP.  I had Dr. Tyrone Nine take a look.  He is hesitant to make any changes from what she is already receiving for BP at this time.  He said if still high in morning maybe we should consult medicine team to evaluate if a change in medication or an additional med is warranted.  Family not at bedside at this time but we will update upon return.    Pt was fairly calm other than wincing a little as the BP cuff inflated.    I also asked about leukocytes in urine and kidney function.  No tx has been deemed necessary at this point.   Update: when updating family, he stated that he discovered she had not been taking her BP meds the way she is suppose to. Perhaps she just needs time for her current regimine to catch up

## 2021-08-20 NOTE — ED Notes (Signed)
Son is no longer at bedside at this time.
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
## 2021-08-20 NOTE — ED Notes (Signed)
Over last little while patient has started wanting to get out of bed.  She remains confused. Family has remained at bedside.

## 2021-08-20 NOTE — NC FL2 (Signed)
[place] LEVEL OF CARE SCREENING TOOL     IDENTIFICATION  Patient Name: [name] Birthdate: 08-Feb-1933 Sex: female Admission Date (Current Location): 08/19/2021  [facility] and [place] Number:  Herbalist and Address:  [facility], [place] [address] [place], [place] [address]      Provider Number: [id number]  Attending Physician Name and Address:  Default, Provider, MD  Relative Name and Phone Number:  Nelia Rogoff, [phone]    Current Level of Care: Other (Comment) (emergency department) Recommended Level of Care: [place] Prior Approval Number:    Date Approved/Denied:   PASRR Number: [id number]  Discharge Plan: SNF    Current Diagnoses: Patient Active Problem List   Diagnosis Date Noted   Dementia ([place])    Upper GI bleed    Aspiration pneumonia (HCC)    Leukocytosis    Melena    Acute blood loss anemia    Duodenal ulcer hemorrhage    Hypoxemia 09/03/2015   Hypokalemia 09/03/2015   Asthma exacerbation 09/03/2015   Encounter for screening colonoscopy 09/24/2013   Elevated LFTs 09/24/2013   CKD (chronic kidney disease) stage 3, GFR 30-59 ml/min (HCC)    Spinal stenosis    Food impaction of esophagus 08/02/2011   GERD 09/23/2009   RENAL INSUFFICIENCY 09/23/2009   GASTROINTESTINAL HEMORRHAGE, HX OF 08/13/2008    [place] RESPIRATION BLADDER Height & Weight     Self  Normal Incontinent Weight:   Height:     BEHAVIORAL SYMPTOMS/MOOD NEUROLOGICAL BOWEL NUTRITION STATUS      Incontinent    AMBULATORY STATUS COMMUNICATION OF NEEDS Skin   Limited Assist Verbally Normal                       Personal Care Assistance Level of Assistance  Bathing, Feeding, Dressing Bathing Assistance: Limited assistance Feeding assistance: Limited assistance Dressing Assistance: Limited assistance     Functional Limitations Info             SPECIAL CARE FACTORS FREQUENCY  PT (By licensed  PT), OT (By licensed OT)     PT Frequency: 5x weekly OT Frequency: 5x weekly            Contractures Contractures Info: Not present    Additional Factors Info  Code Status, Allergies Code Status Info: full Allergies Info: demerol           Current Medications (08/20/2021):  This is the current hospital active medication list Current Facility-Administered Medications  Medication Dose Route Frequency Provider Last Rate Last Admin   acetaminophen (TYLENOL) tablet 650 mg  650 mg Oral Q4H PRN Ripley Fraise, MD       albuterol (PROVENTIL) (2.5 MG/3ML) 0.083% nebulizer solution 2.5 mg  2.5 mg Inhalation Q4H PRN Ripley Fraise, MD       amLODipine (NORVASC) tablet 10 mg  10 mg Oral Daily Ripley Fraise, MD   10 mg at 08/20/21 0925   donepezil (ARICEPT) tablet 10 mg  10 mg Oral QHS Ripley Fraise, MD       LORazepam (ATIVAN) tablet 0.5 mg  0.5 mg Oral Q6H PRN Ripley Fraise, MD   0.5 mg at 08/20/21 0327   memantine (NAMENDA) tablet 10 mg  10 mg Oral BID Ripley Fraise, MD   10 mg at 08/20/21 0925   metoprolol tartrate (LOPRESSOR) tablet 25 mg  25 mg Oral BID Ripley Fraise, MD   25 mg at 08/20/21 [phone]  pantoprazole (PROTONIX) EC tablet 40 mg  40 mg Oral Daily Ripley Fraise, MD   40 mg at 08/20/21 [phone]   Current Outpatient Medications  Medication Sig Dispense Refill   acetaminophen (TYLENOL) 650 MG CR tablet Take 650 mg by mouth every 8 (eight) hours as needed for pain.     amLODipine (NORVASC) 10 MG tablet TAKE 1 TABLET BY MOUTH EVERY DAY (Patient taking differently: Take 10 mg by mouth daily.) 90 tablet 1   donepezil (ARICEPT) 10 MG tablet TAKE 1 TABLET BY MOUTH EVERYDAY AT BEDTIME (Patient taking differently: Take 10 mg by mouth at bedtime.) 90 tablet 0   memantine (NAMENDA) 10 MG tablet TAKE 1 TABLET BY MOUTH TWICE A DAY (Patient taking differently: Take 10 mg by mouth 2 (two) times daily.) 180 tablet 3   metoprolol tartrate (LOPRESSOR) 25 MG tablet TAKE 1 TABLET BY  MOUTH TWICE A DAY (Patient taking differently: Take 25 mg by mouth 2 (two) times daily.) 180 tablet 1   pantoprazole (PROTONIX) 40 MG tablet TAKE 1 TABLET BY MOUTH EVERY DAY (Patient taking differently: Take 40 mg by mouth daily.) 90 tablet 1   citalopram (CELEXA) 20 MG tablet TAKE 1 TABLET DAILY (Patient not taking: Reported on 08/20/2021) 90 tablet 3     Discharge Medications: Please see discharge summary for a list of discharge medications.  Relevant Imaging Results:  Relevant Lab Results:   Additional Information SSN: [id number]  Alfredia Ferguson, LCSW

## 2021-08-20 NOTE — ED Notes (Signed)
Pt found standing at end of bed with brief in floor and having voided on the floor.  Pt placed back in bed with new brief and urine cleaned up by sitter.

## 2021-08-20 NOTE — ED Notes (Signed)
Ambulates to restroom without difficulty

## 2021-08-20 NOTE — ED Notes (Signed)
Son needed to leave and go home for a few mins, but states he will return.

## 2021-08-20 NOTE — ED Notes (Signed)
PT saturated in urine. RN cleaned pt and noticed a stage 2 wounds in the peri rectal area. RN applied new sheet, diaper, and chuck. Pt is now less agitated.

## 2021-08-20 NOTE — ED Provider Notes (Signed)
[facility] EMERGENCY DEPARTMENT Provider Note   CSN: [id number] Arrival date & time: 08/19/21  2017     History  Chief Complaint  Patient presents with   Placement    [name] is a 88 y.o. female.  The history is provided by a relative.   Patient presents for placement in a nursing home.  She is brought in by family.  Apparently patient is typically managed by another family member.  However they were in [facility] recently and that family member was arrested and now the patient has nowhere to go. Apparently the family present are present are unable to provide a safe place for her to go.  Patient has a history of dementia and is otherwise at her baseline  Home Medications Prior to Admission medications   Medication Sig Start Date End Date Taking? Authorizing Provider  albuterol (PROVENTIL HFA;VENTOLIN HFA) 108 (90 BASE) MCG/ACT inhaler Inhale 2 puffs into the lungs every 4 (four) hours as needed for wheezing or shortness of breath. 05/25/14   Susy Frizzle, MD  albuterol (PROVENTIL) (2.5 MG/3ML) 0.083% nebulizer solution Take 3 mLs (2.5 mg total) by nebulization every 6 (six) hours as needed for wheezing or shortness of breath. Patient not taking: Reported on 07/22/2020 06/14/14   Susy Frizzle, MD  amLODipine (NORVASC) 10 MG tablet TAKE 1 TABLET BY MOUTH EVERY DAY 05/15/21   Susy Frizzle, MD  Biotin 2500 MCG CAPS Take 2,500 mg by mouth daily.    [provider]  citalopram (CELEXA) 20 MG tablet TAKE 1 TABLET DAILY 11/04/14   Susy Frizzle, MD  donepezil (ARICEPT) 10 MG tablet TAKE 1 TABLET BY MOUTH EVERYDAY AT BEDTIME 07/06/21   Susy Frizzle, MD  ferrous sulfate 325 (65 FE) MG tablet Take 325 mg by mouth 2 (two) times daily with a meal.    [provider]  memantine (NAMENDA) 10 MG tablet TAKE 1 TABLET BY MOUTH TWICE A DAY 06/22/21   Susy Frizzle, MD  metoprolol tartrate (LOPRESSOR) 25 MG tablet TAKE 1 TABLET BY MOUTH  TWICE A DAY 06/15/21   Susy Frizzle, MD  Multiple Vitamin (MULTIVITAMIN WITH MINERALS) TABS tablet Take 1 tablet by mouth daily.    [provider]  pantoprazole (PROTONIX) 40 MG tablet TAKE 1 TABLET BY MOUTH EVERY DAY 06/15/21   Susy Frizzle, MD  traMADol (ULTRAM) 50 MG tablet TAKE 1 TABLET BY MOUTH EVERY 8 HOURS AS NEEDED FOR SEVERE PAIN. 12/07/19   Susy Frizzle, MD  vitamin B-12 (CYANOCOBALAMIN) 500 MCG tablet Take 500 mcg by mouth daily.    [provider]      Allergies    Demerol [meperidine] and Meperidine hcl    Review of Systems   Review of Systems  Constitutional:  Negative for fever.   Physical Exam Updated Vital Signs BP (!) 200/82   Pulse 65   Temp 98.4 F (36.9 C) (Oral)   Resp 16   SpO2 100%  Physical Exam CONSTITUTIONAL: Elderly and frail, no acute distress HEAD: Normocephalic/atraumatic EYES: EOMI/PERRL ENMT: Mucous membranes moist NECK: supple no meningeal signs CV: S1/S2 noted LUNGS: Lungs are clear to auscultation bilaterally, no apparent distress ABDOMEN: soft NEURO: Pt is awake/alert moves all extremitiesx4.  No facial droop.  She is pleasantly confused EXTREMITIES: full ROM SKIN: warm, color normal   ED Results / Procedures / Treatments   Labs (all labs ordered are listed, but only abnormal results are displayed)  Labs Reviewed  COMPREHENSIVE METABOLIC PANEL - Abnormal; Notable for the following components:      Result Value   Glucose, Bld 106 (*)    BUN 32 (*)    Creatinine, Ser 1.99 (*)    Total Protein 6.4 (*)    GFR, Estimated 24 (*)    All other components within normal limits  URINALYSIS, ROUTINE W REFLEX MICROSCOPIC - Abnormal; Notable for the following components:   Hgb urine dipstick SMALL (*)    Leukocytes,Ua LARGE (*)    All other components within normal limits  RESP PANEL BY RT-PCR (FLU A&B, COVID) ARPGX2  CBC    EKG None  Radiology No results found.  Procedures Procedures    Medications  Ordered in ED Medications  albuterol (VENTOLIN HFA) 108 (90 Base) MCG/ACT inhaler 2 puff (has no administration in time range)  amLODipine (NORVASC) tablet 10 mg (has no administration in time range)  citalopram (CELEXA) tablet 20 mg (has no administration in time range)  donepezil (ARICEPT) tablet 10 mg (has no administration in time range)  memantine (NAMENDA) tablet 10 mg (has no administration in time range)  metoprolol tartrate (LOPRESSOR) tablet 25 mg (has no administration in time range)  pantoprazole (PROTONIX) EC tablet 40 mg (has no administration in time range)  acetaminophen (TYLENOL) tablet 650 mg (has no administration in time range)  LORazepam (ATIVAN) tablet 0.5 mg (has no administration in time range)    ED Course/ Medical Decision Making/ A&P Clinical Course as of 08/20/21 0253  Sun Aug 20, 2021  0250 Creatinine(!): 1.99 Chronic renal insufficiency [DW]    Clinical Course User Index [DW] Ripley Fraise, MD                           Medical Decision Making Amount and/or Complexity of Data Reviewed Labs: ordered. Decision-making details documented in ED Course.  Risk OTC drugs. Prescription drug management.   2:55 AM Patient brought in by family because she needs " emergency placement in a nursing home" Apparently the family member that cares for her is in jail and patient does not have a safe place to live. The family that is present reports they cannot manage her at home because they care for 4 pit bulls and is not safe  they have contacted facilities including [facility] and feels she would be a good candidate for that facility. They are requesting that I emergently fill out an FL-2 and get her placed tonight  I discussed the process for placing patients in nursing facilities.  They were informed the patient will be held overnight in the ER and will be seen by her case management team to assist with placement.  Patient is awake and alert no acute distress.   She appears at her baseline.  I have reviewed labs and she has chronic renal insufficiency at baseline. Home medications have been ordered         Final Clinical Impression(s) / ED Diagnoses Final diagnoses:  Alzheimer's dementia, unspecified dementia severity, unspecified timing of dementia onset, unspecified whether behavioral, psychotic, or mood disturbance or anxiety ([place])  Chronic kidney disease, unspecified CKD stage    Rx / DC Orders ED Discharge Orders     None         Ripley Fraise, MD 08/20/21 [phone]

## 2021-08-20 NOTE — ED Notes (Signed)
Pt tried to get up and legs through rail.  This RN observed this and redirected her back into bed helping her get her legs out of rail.

## 2021-08-20 NOTE — Progress Notes (Signed)
   08/20/21 1411  TOC ED Mini Assessment  PING Used in TOC Assessment No  Admission or Readmission Diverted Yes  Interventions which prevented an admission or readmission SNF Placement  What brought you to the Emergency Department?  placement  Barriers to Discharge SNF Pending bed offer  Barrier interventions SNF referral, coordination with family  CMS Medicare.gov Compare Post Acute Care list provided to: Patient Represenative (must comment)  Choice offered to / list presented to  Adult Children   CSW noted referral for SNF. CSW updated son and noted preference for [facility] and [facility]. CSW initiated referral process and will follow-up with family when bed offers are made.

## 2021-08-20 NOTE — ED Notes (Signed)
Pt actively trying to jump over side rails. RN expressed concern to CN. CN states to place sitter order and medication. Sitter order placed.

## 2021-08-21 ENCOUNTER — Emergency Department (HOSPITAL_COMMUNITY): Payer: Medicare PPO

## 2021-08-21 DIAGNOSIS — R059 Cough, unspecified: Secondary | ICD-10-CM | POA: Diagnosis not present

## 2021-08-21 MED ORDER — METOPROLOL TARTRATE 25 MG PO TABS
50.0000 mg | ORAL_TABLET | Freq: Two times a day (BID) | ORAL | Status: DC
  Administered 2021-08-21 – 2021-08-25 (×9): 50 mg via ORAL
  Filled 2021-08-21 (×9): qty 2

## 2021-08-21 NOTE — Progress Notes (Signed)
CSW spoke with patients son, Ivor Reining, [phone], and nephew Gerald Stabs, [phone]. Gerald Stabs is currently trying to place patient into a memory care unit at [facility]. Patient does not meet criteria for Medicaid for long term placement at a nursing facility. Family does not have a plan for patient after SNF placement and considering facilities not willing to take patient unless there is a plan. [facility] financial verified with patients guardianship agency that patient has more than enough money to pay out of pocket for memory care. CSW spoke with [facility] who will have someone reach out to CSW about conducting an assessment on patient in the emergency room.

## 2021-08-21 NOTE — ED Provider Notes (Signed)
Emergency Medicine Observation Re-evaluation Note  [name] is a 88 y.o. female, seen on rounds today.  Pt initially presented to the ED for complaints of Placement Currently, the patient is resting.  Physical Exam  BP (!) 179/86 (BP Location: Left Arm)   Pulse 60   Temp 97.7 F (36.5 C) (Oral)   Resp 16   SpO2 100%  Physical Exam General: No distress Cardiac: Regular rate and rhythm Lungs: No increased work of breathing Psych: Currently calm  ED Course / MDM  EKG:   I have reviewed the labs performed to date as well as medications administered while in observation.  Recent changes in the last 24 hours include episodes of irritation requiring Ativan.  Patient also has persistent hypertension and spite of being provided her home medication.  Given this, patient had increase in her dosing of beta-blocker  Plan  Current plan is for placement.  [name] is not under involuntary commitment.     Carmin Muskrat, MD 08/21/21 [phone]

## 2021-08-21 NOTE — ED Notes (Signed)
NT changed pt into hospital bed, changed pt's linens and depends.

## 2021-08-21 NOTE — NC FL2 (Signed)
[place] LEVEL OF CARE SCREENING TOOL     IDENTIFICATION  Patient Name: [name] Birthdate: March 30, 1932 Sex: female Admission Date (Current Location): 08/19/2021  [facility] and [place] Number:  Herbalist and Address:  [facility] [place] [address], [place], [place] [address]      Provider Number: [id number]  Attending Physician Name and Address:  Default, Provider, MD  Relative Name and Phone Number:  Jaaliyah, [place], [phone]    Current Level of Care: Hospital Recommended Level of Care: Memory Care Prior Approval Number:    Date Approved/Denied:   PASRR Number: [id number]  Discharge Plan: Other (Comment) (Memory Care)    Current Diagnoses: Patient Active Problem List   Diagnosis Date Noted   Dementia ([place])    Upper GI bleed    Aspiration pneumonia ([place])    Leukocytosis    Melena    Acute blood loss anemia    Duodenal ulcer hemorrhage    Hypoxemia 09/03/2015   Hypokalemia 09/03/2015   Asthma exacerbation 09/03/2015   Encounter for screening colonoscopy 09/24/2013   Elevated LFTs 09/24/2013   CKD (chronic kidney disease) stage 3, GFR 30-59 ml/min (HCC)    Spinal stenosis    Food impaction of esophagus 08/02/2011   GERD 09/23/2009   RENAL INSUFFICIENCY 09/23/2009   GASTROINTESTINAL HEMORRHAGE, HX OF 08/13/2008    Orientation RESPIRATION BLADDER Height & Weight     Self  Normal Incontinent Weight: 161 lbs Height: 5'2   BEHAVIORAL SYMPTOMS/MOOD NEUROLOGICAL BOWEL NUTRITION STATUS      Incontinent Diet (Regular)  AMBULATORY STATUS COMMUNICATION OF NEEDS Skin   Limited Assist Verbally Normal                       Personal Care Assistance Level of Assistance  Bathing, Feeding, Dressing Bathing Assistance: Maximum assistance Feeding assistance: Limited assistance Dressing Assistance: Maximum assistance     Functional Limitations Info  Sight, Hearing, Speech Sight Info: Adequate Hearing  Info: Adequate Speech Info: Adequate    SPECIAL CARE FACTORS FREQUENCY  PT (By licensed PT), OT (By licensed OT)     PT Frequency: 5x weekly OT Frequency: 5x weekly            Contractures Contractures Info: Not present    Additional Factors Info  Code Status, Allergies Code Status Info: Full Allergies Info: Demerol           Current Medications (08/21/2021):  This is the current hospital active medication list Current Facility-Administered Medications  Medication Dose Route Frequency Provider Last Rate Last Admin   acetaminophen (TYLENOL) tablet 650 mg  650 mg Oral Q4H PRN Ripley Fraise, MD       albuterol (PROVENTIL) (2.5 MG/3ML) 0.083% nebulizer solution 2.5 mg  2.5 mg Inhalation Q4H PRN Ripley Fraise, MD       amLODipine (NORVASC) tablet 10 mg  10 mg Oral Daily Ripley Fraise, MD   10 mg at 08/21/21 3329   donepezil (ARICEPT) tablet 10 mg  10 mg Oral QHS Ripley Fraise, MD   10 mg at 08/20/21 2132   LORazepam (ATIVAN) tablet 0.5 mg  0.5 mg Oral Q6H PRN Ripley Fraise, MD   0.5 mg at 08/20/21 0327   memantine (NAMENDA) tablet 10 mg  10 mg Oral BID Ripley Fraise, MD   10 mg at 08/21/21 5188   metoprolol tartrate (LOPRESSOR) tablet 50 mg  50 mg Oral BID Carmin Muskrat, MD   50  mg at 08/21/21 0922   pantoprazole (PROTONIX) EC tablet 40 mg  40 mg Oral Daily Ripley Fraise, MD   40 mg at 08/21/21 8850   Current Outpatient Medications  Medication Sig Dispense Refill   acetaminophen (TYLENOL) 650 MG CR tablet Take 650 mg by mouth every 8 (eight) hours as needed for pain.     amLODipine (NORVASC) 10 MG tablet TAKE 1 TABLET BY MOUTH EVERY DAY (Patient taking differently: Take 10 mg by mouth daily.) 90 tablet 1   donepezil (ARICEPT) 10 MG tablet TAKE 1 TABLET BY MOUTH EVERYDAY AT BEDTIME (Patient taking differently: Take 10 mg by mouth at bedtime.) 90 tablet 0   memantine (NAMENDA) 10 MG tablet TAKE 1 TABLET BY MOUTH TWICE A DAY (Patient taking differently: Take  10 mg by mouth 2 (two) times daily.) 180 tablet 3   metoprolol tartrate (LOPRESSOR) 25 MG tablet TAKE 1 TABLET BY MOUTH TWICE A DAY (Patient taking differently: Take 25 mg by mouth 2 (two) times daily.) 180 tablet 1   pantoprazole (PROTONIX) 40 MG tablet TAKE 1 TABLET BY MOUTH EVERY DAY (Patient taking differently: Take 40 mg by mouth daily.) 90 tablet 1   citalopram (CELEXA) 20 MG tablet TAKE 1 TABLET DAILY (Patient not taking: Reported on 08/20/2021) 90 tablet 3     Discharge Medications: Please see discharge summary for a list of discharge medications.  Relevant Imaging Results:  Relevant Lab Results:   Additional Information SSN: [id number],  Raina Mina, LCSWA

## 2021-08-22 ENCOUNTER — Telehealth: Payer: Self-pay | Admitting: Family Medicine

## 2021-08-22 MED ORDER — NYSTATIN 100000 UNIT/GM EX POWD
Freq: Once | CUTANEOUS | Status: AC
  Filled 2021-08-22 (×2): qty 15

## 2021-08-22 NOTE — ED Notes (Signed)
Lunch order placed

## 2021-08-22 NOTE — Telephone Encounter (Signed)
FYI:  Patients son Louie Casa left vm stating that there was a situation and that he needed to place his mom somewhere. That he was told that they would need an FL2 form filled out. I returned his call to let him know that you were out of the office this week. He said that his nephew had already called up here and got an appt for when you returned.He said that he thinks he can get the [facility] form filled out from the Hospital.  He did say that patient wasn't with [facility] anymore that they had gotten her out of that situation.  Patient does have an appt with you for 08/31/21

## 2021-08-22 NOTE — ED Provider Notes (Addendum)
Emergency Medicine Observation Re-evaluation Note  [name] is a 88 y.o. female, seen on rounds today.  Pt initially presented to the ED for complaints of Placement Currently, the patient is resting.  Physical Exam  BP (!) 203/81 (BP Location: Right Arm) Comment: RN aware  Pulse 71   Temp 98.1 F (36.7 C) (Oral)   Resp 20   SpO2 98%  Physical Exam General: resting comfortably, NAD Lungs: normal WOB Psych: currently calm and resting ED Course / MDM  EKG:   I have reviewed the labs performed to date as well as medications administered while in observation.  Recent changes in the last 24 hours include none.  Plan  Current plan is for evaluation by [facility] for possible placement.  There was concern for a possible wound on the patient's bottom.  On my evaluation there appears to be a fungal infection of the gluteal cleft.  There is a small area at the the superior portion that has broken skin but there is no pressure wound/ulcer.  Will prescribe nystatin powder for treatment.   [name] is not under involuntary commitment.     Lorelle Gibbs, DO 08/22/21 1511    Lorelle Gibbs, DO 08/22/21 1612

## 2021-08-22 NOTE — Progress Notes (Signed)
Patient has a video call assessment with Durenda Age today at 1:00 PM

## 2021-08-22 NOTE — ED Notes (Signed)
Pt is awake and has been up and down for the last hr or so.  She went to the bathroom and is now sitting in the recliner.  I let her have some decaf coffee at her request in hopes it will help her wind down.

## 2021-08-22 NOTE — ED Notes (Signed)
SNF evaluator and pt.'s son is at the bedside

## 2021-08-22 NOTE — ED Notes (Addendum)
This RN assessed and dressed pt.'s sacral wound. It was approximately 0.5 inch long and extremely thin. Wound appears to be yeast related with red irritation.

## 2021-08-22 NOTE — Progress Notes (Signed)
PT Cancellation Note  Patient Details Name: [name] MRN: [id number] DOB: 1932/07/23   Cancelled Treatment:    Reason Eval/Treat Not Completed: Other (comment).   Pt was initially getting ready to eat and now is being interviewed with family for SNF placement.   Ramond Dial 08/22/2021, 2:06 PM  Mee Hives, PT PhD Acute Rehab Dept. Number: [place] [facility] [place]

## 2021-08-23 MED ORDER — ZINC OXIDE 11.3 % EX CREA
TOPICAL_CREAM | Freq: Two times a day (BID) | CUTANEOUS | Status: DC
  Administered 2021-08-24: 1 via TOPICAL
  Filled 2021-08-23: qty 56

## 2021-08-23 MED ORDER — CLOTRIMAZOLE 1 % EX CREA
TOPICAL_CREAM | Freq: Two times a day (BID) | CUTANEOUS | Status: DC
  Administered 2021-08-24: 1 via TOPICAL
  Filled 2021-08-23: qty 15

## 2021-08-23 NOTE — Progress Notes (Signed)
PT Cancellation Note  Patient Details Name: [name] MRN: [id number] DOB: 1932/06/23   Cancelled Treatment:    Reason Eval/Treat Not Completed: Other (comment)Pt currently eating lunch. Plan to reattempt after pt finishes.   Lou Miner, DPT  Acute Rehabilitation Services  Office: [phone]    Rudean Hitt 08/23/2021, 1:16 PM

## 2021-08-23 NOTE — Progress Notes (Signed)
Physical Therapy Treatment Patient Details Name: [name] MRN: [id number] DOB: Feb 02, 1933 Today's Date: 08/23/2021   History of Present Illness Pt is an 88 year old female with advanced dementia, brought to the ED by family who are no longer able to care for pt and meet her safety needs; her typical caregiver is detained, and other family struggling to care for pt;  has a past medical history of CKD (chronic kidney disease) stage 3, GFR 30-59 ml/min ([place]), Dementia ([place]), Hypercholesterolemia, Hypertension, Mild acid reflux, Mitral valve prolapse, Renal insufficiency, Spinal stenosis, Staph infection, and Upper GI bleed.    PT Comments    Pt progressing towards goals. Remains unsteady and had 1 LOB during gait requiring mod A for steadying. Otherwise requiring min A for mobility tasks. Reviewed seated and supine LE HEP with pt. Current plan remains appropriate. Will continue to follow acutely.     Recommendations for follow up therapy are one component of a multi-disciplinary discharge planning process, led by the attending physician.  Recommendations may be updated based on patient status, additional functional criteria and insurance authorization.  Follow Up Recommendations  Skilled nursing-short term rehab (<3 hours/day)     Assistance Recommended at Discharge Frequent or constant Supervision/Assistance  Patient can return home with the following A little help with walking and/or transfers;A lot of help with bathing/dressing/bathroom;Assistance with cooking/housework;Help with stairs or ramp for entrance;Assist for transportation   Equipment Recommendations  Rolling walker (2 wheels);BSC/3in1    Recommendations for Other Services       Precautions / Restrictions Precautions Precautions: Fall Restrictions Weight Bearing Restrictions: No     Mobility  Bed Mobility Overal bed mobility: Needs Assistance Bed Mobility: Supine to Sit, Sit to Supine     Supine to sit: Min  guard Sit to supine: Min guard   General bed mobility comments: [place] for safety; did not need physical assist    Transfers Overall transfer level: Needs assistance Equipment used: 1 person hand held assist Transfers: Sit to/from Stand Sit to Stand: Min assist           General transfer comment: Min handheld assist to steady    Ambulation/Gait Ambulation/Gait assistance: Min assist, Mod assist Gait Distance (Feet): 75 Feet Assistive device: 1 person hand held assist Gait Pattern/deviations: Decreased step length - right, Decreased step length - left, Decreased stride length Gait velocity: Decreased     General Gait Details: Min A for steadying secondary to unsteadiness. Pt with LOB when turning and requiring mod A for steadying.   Stairs             Wheelchair Mobility    Modified Rankin (Stroke Patients Only)       Balance Overall balance assessment: Needs assistance Sitting-balance support: No upper extremity supported Sitting balance-Leahy Scale: Fair     Standing balance support: Single extremity supported Standing balance-Leahy Scale: Poor Standing balance comment: reliant on UE support                            Cognition Arousal/Alertness: Awake/alert Behavior During Therapy: WFL for tasks assessed/performed Overall Cognitive Status: No family/caregiver present to determine baseline cognitive functioning                                 General Comments: Noted history of advanced dementia        Exercises General Exercises - Lower  Extremity Ankle Circles/Pumps: AROM, Both, 10 reps (Required multimodal cues for sequencing) Long Arc Quad: AROM, Both, 10 reps, Seated Straight Leg Raises: AROM, Both, 5 reps Hip Flexion/Marching: AROM, Both, 10 reps, Seated    General Comments        Pertinent Vitals/Pain Pain Assessment Pain Assessment: No/denies pain    Home Living                           Prior Function            PT Goals (current goals can now be found in the care plan section) Acute Rehab PT Goals Patient Stated Goal: none stated PT Goal Formulation: Patient unable to participate in goal setting Time For Goal Achievement: 09/03/21 Potential to Achieve Goals: Good Progress towards PT goals: Progressing toward goals    Frequency    Min 2X/week      PT Plan Current plan remains appropriate    Co-evaluation              AM-PAC PT "6 Clicks" Mobility   Outcome Measure  Help needed turning from your back to your side while in a flat bed without using bedrails?: A Little Help needed moving from lying on your back to sitting on the side of a flat bed without using bedrails?: A Little Help needed moving to and from a bed to a chair (including a wheelchair)?: A Little Help needed standing up from a chair using your arms (e.g., wheelchair or bedside chair)?: A Little Help needed to walk in hospital room?: A Lot Help needed climbing 3-5 steps with a railing? : Total 6 Click Score: 15    End of Session Equipment Utilized During Treatment: Gait belt Activity Tolerance: Patient tolerated treatment well Patient left: in bed;with call bell/phone within reach (in bed in ED) Nurse Communication: Mobility status PT Visit Diagnosis: Unsteadiness on feet (R26.81);Other abnormalities of gait and mobility (R26.89)     Time: 2119-4174 PT Time Calculation (min) (ACUTE ONLY): 14 min  Charges:  $Gait Training: 8-22 mins                     Reuel Derby, PT, DPT  Acute Rehabilitation Services  Office: [phone]    Rudean Hitt 08/23/2021, 3:06 PM

## 2021-08-23 NOTE — Progress Notes (Signed)
Patients family (Son, Louie Casa, Niece Isaias Sakai, and Lia Foyer) are refusing for patient to discharge back to one of their homes until patient can be accepted into [facility]. CSW told the family they can hire a caregiver to come into the home and address all patients needs. Patient has plenty of money in the bank to pay for these services. Patient has 3 SNF bed offers but the family does not like the facilities ratings. CSW stated once again that patient can discharge to their home and they can pay for a caregiver if they do no like the SNF ratings. Patients niece Isaias Sakai stated she would like patient to come to her facility, [facility]. CSW asked if [facility] does not accept patient or her facility what will be the plan. Patients niece stated we are not at that point. CSW explained that they need to work ahead on a plan because patient cannot continue to sit in the emergency room. Patient niece Isaias Sakai started yelling and claiming that the hospital does not know what they are doing. Patients niece requested a referral be sent to [facility]. CSW spoke with admissions Henry Russel [phone] who stated she would review patient.    [facility] RN Adan Sis assessed patient yesterday and stated patient has a stage 3 wound. ED providers reassessed patient and determined patient has a fungal infection. Area director Roselie Awkward with [facility] will reevaluate patient.

## 2021-08-23 NOTE — ED Provider Notes (Addendum)
Emergency Medicine Observation Re-evaluation Note  [name] is a 88 y.o. female, seen on rounds today.  Pt initially presented to the ED for complaints of Placement Currently, the patient is resting.  Physical Exam  BP (!) 175/73 (BP Location: Right Arm)   Pulse 75   Temp 98.4 F (36.9 C) (Oral)   Resp 18   SpO2 95%  Physical Exam General: NAD Cardiac: Regular HR Lungs: No respiratory distress  Psych: Stable  ED Course / MDM  EKG:   I have reviewed the labs performed to date as well as medications administered while in observation.    Plan  Current plan is for placement  [name] is not under involuntary commitment.  Wound on buttock examined and consistent with intertrigo/ tinea cruris - we'll try clotromazole x 20 days (this can take 2-4 weeks to heal) and zinc oxide cream. This can be managed in a nursing facility or outpatient setting. This is not a sacral decubitus ulcer.   Wyvonnia Dusky, MD 08/23/21 [id number]    Wyvonnia Dusky, MD 08/23/21 [phone]

## 2021-08-23 NOTE — ED Notes (Signed)
Pt received lunch tray 

## 2021-08-23 NOTE — ED Notes (Signed)
PT at bedside.

## 2021-08-23 NOTE — Progress Notes (Signed)
Documents sent to [facility] for admission. CSW will know further tomorrow of what time and date patient will be discharging to [place]. Wound issue was resolved.

## 2021-08-23 NOTE — ED Notes (Signed)
Pt resting in bed watching tv.

## 2021-08-24 NOTE — ED Notes (Signed)
Patient medicated with PRN medication for anxiety.  Patient has been walking in and out of room.  Asking "what do I need to do to help with my situation?"  Checked patient's adult brief and it is currently dry and clean.  Mepilex sacral pad intact and dry.  Cream placed per order.  Patient assisted back into bed.  Covers provided.  Patient reoriented to time and situation.

## 2021-08-24 NOTE — ED Notes (Signed)
Patient out of bed and in hallway.  Redirected back to room and assisted back in bed.

## 2021-08-24 NOTE — ED Notes (Signed)
Patient out of bed and walking in hallway.  Looking through housekeeping cart.  Patient redirected back to room.

## 2021-08-24 NOTE — Progress Notes (Signed)
Patient is discharging to [facility] tomorrow morning. Patients son has not completed paperwork for [facility].

## 2021-08-24 NOTE — ED Notes (Signed)
PT siting in chair eating meal.

## 2021-08-24 NOTE — ED Notes (Signed)
Patient continues to get out of bed and walk into hallway.  Patient remains confused.  Redirected back to room and assisted back in bed.

## 2021-08-24 NOTE — ED Provider Notes (Signed)
Emergency Medicine Observation Re-evaluation Note  Jeslin P Arakawa is a 88 y.o. female, seen on rounds today.  Pt initially presented to the ED for complaints of Placement Currently, the patient is sitting in reclining bedside chair sleeping.  Physical Exam  BP (!) 156/94 (BP Location: Left Arm)   Pulse (!) 57   Temp 98 F (36.7 C) (Oral)   Resp 18   SpO2 93%  Physical Exam General: non toxic, no distress Lungs: respirations even and unlabored   ED Course / MDM  EKG:   I have reviewed the labs performed to date as well as medications administered while in observation.  Recent changes in the last 24 hours include wound to buttock dressed with clotrimazole and zinc per prior order.  Plan  Current plan is for pending placement.  Zaira P Crosson is not under involuntary commitment.     Tacy Learn, PA-C 08/24/21 1304    Isla Pence, MD 08/24/21 1336

## 2021-08-24 NOTE — ED Notes (Signed)
Son at bedside.

## 2021-08-24 NOTE — ED Notes (Signed)
Patient out of bed and chair.  Redirected back to room and provided with water and wash clothes to fold.  Patient currently sitting in chair and watching TV

## 2021-08-25 DIAGNOSIS — R2689 Other abnormalities of gait and mobility: Secondary | ICD-10-CM | POA: Diagnosis not present

## 2021-08-25 DIAGNOSIS — R531 Weakness: Secondary | ICD-10-CM | POA: Diagnosis not present

## 2021-08-25 DIAGNOSIS — R031 Nonspecific low blood-pressure reading: Secondary | ICD-10-CM | POA: Diagnosis not present

## 2021-08-25 DIAGNOSIS — F02818 Dementia in other diseases classified elsewhere, unspecified severity, with other behavioral disturbance: Secondary | ICD-10-CM | POA: Diagnosis not present

## 2021-08-25 DIAGNOSIS — M4800 Spinal stenosis, site unspecified: Secondary | ICD-10-CM | POA: Diagnosis not present

## 2021-08-25 DIAGNOSIS — F0280 Dementia in other diseases classified elsewhere without behavioral disturbance: Secondary | ICD-10-CM | POA: Diagnosis not present

## 2021-08-25 DIAGNOSIS — R52 Pain, unspecified: Secondary | ICD-10-CM | POA: Diagnosis not present

## 2021-08-25 DIAGNOSIS — F0390 Unspecified dementia without behavioral disturbance: Secondary | ICD-10-CM | POA: Diagnosis not present

## 2021-08-25 DIAGNOSIS — N184 Chronic kidney disease, stage 4 (severe): Secondary | ICD-10-CM | POA: Diagnosis not present

## 2021-08-25 DIAGNOSIS — F32A Depression, unspecified: Secondary | ICD-10-CM | POA: Diagnosis not present

## 2021-08-25 DIAGNOSIS — I129 Hypertensive chronic kidney disease with stage 1 through stage 4 chronic kidney disease, or unspecified chronic kidney disease: Secondary | ICD-10-CM | POA: Diagnosis not present

## 2021-08-25 DIAGNOSIS — B356 Tinea cruris: Secondary | ICD-10-CM | POA: Diagnosis not present

## 2021-08-25 DIAGNOSIS — E785 Hyperlipidemia, unspecified: Secondary | ICD-10-CM | POA: Diagnosis not present

## 2021-08-25 DIAGNOSIS — R5381 Other malaise: Secondary | ICD-10-CM | POA: Diagnosis not present

## 2021-08-25 DIAGNOSIS — J45909 Unspecified asthma, uncomplicated: Secondary | ICD-10-CM | POA: Diagnosis not present

## 2021-08-25 DIAGNOSIS — K219 Gastro-esophageal reflux disease without esophagitis: Secondary | ICD-10-CM | POA: Diagnosis not present

## 2021-08-25 DIAGNOSIS — F0283 Dementia in other diseases classified elsewhere, unspecified severity, with mood disturbance: Secondary | ICD-10-CM | POA: Diagnosis not present

## 2021-08-25 DIAGNOSIS — G309 Alzheimer's disease, unspecified: Secondary | ICD-10-CM | POA: Diagnosis not present

## 2021-08-25 NOTE — Progress Notes (Signed)
Patient discharged to [facility] in [facility]. Son was present to pick up patient

## 2021-08-25 NOTE — ED Notes (Signed)
Patient showed.  Son brought clothing for patient's discharge.

## 2021-08-25 NOTE — ED Notes (Signed)
Patient discharged in care of son in route to SNF.  Patient in no acute distress.  BP was elevated, however patient ambulated from bed to toilet from toilet to shower.  Patient showered and ambulated to chair.  Vitals then taken.  PO BP medication was administered.  Patient had no personal belongings.

## 2021-08-25 NOTE — ED Provider Notes (Signed)
Emergency Medicine Observation Re-evaluation Note  Saide P Foley is a 88 y.o. female, seen on rounds today.  Pt initially presented to the ED for complaints of Placement Currently, the patient is resting quietly.  Physical Exam  BP (!) 186/106 (BP Location: Right Arm)   Pulse 67   Temp 97.7 F (36.5 C) (Oral)   Resp 18   SpO2 97%  Physical Exam General: No acute distress Cardiac: Well-perfused Lungs: Nonlabored Psych: Cooperative  ED Course / MDM  EKG:   I have reviewed the labs performed to date as well as medications administered while in observation.  Recent changes in the last 24 hours include social work working on placement.  Plan  Current plan is for possible placement today at [facility].  Shadow P Regan is not under involuntary commitment.  9 AM.  Informed patient is being discharged to [place].  Prescriptions not needed per TOC.     Hayden Rasmussen, MD 08/25/21 Vernelle Emerald

## 2021-08-30 NOTE — Telephone Encounter (Signed)
Form completed and given to front desk to call pt to pick

## 2021-08-31 ENCOUNTER — Ambulatory Visit: Payer: Self-pay | Admitting: Family Medicine

## 2021-09-01 DIAGNOSIS — F0390 Unspecified dementia without behavioral disturbance: Secondary | ICD-10-CM | POA: Diagnosis not present

## 2021-09-01 DIAGNOSIS — R52 Pain, unspecified: Secondary | ICD-10-CM | POA: Diagnosis not present

## 2021-09-01 DIAGNOSIS — R2689 Other abnormalities of gait and mobility: Secondary | ICD-10-CM | POA: Diagnosis not present

## 2021-09-01 DIAGNOSIS — R031 Nonspecific low blood-pressure reading: Secondary | ICD-10-CM | POA: Diagnosis not present

## 2021-09-07 DIAGNOSIS — R5381 Other malaise: Secondary | ICD-10-CM | POA: Diagnosis not present

## 2021-09-07 DIAGNOSIS — F0280 Dementia in other diseases classified elsewhere without behavioral disturbance: Secondary | ICD-10-CM | POA: Diagnosis not present

## 2021-09-07 DIAGNOSIS — G309 Alzheimer's disease, unspecified: Secondary | ICD-10-CM | POA: Diagnosis not present

## 2021-09-07 DIAGNOSIS — I129 Hypertensive chronic kidney disease with stage 1 through stage 4 chronic kidney disease, or unspecified chronic kidney disease: Secondary | ICD-10-CM | POA: Diagnosis not present

## 2021-09-07 DIAGNOSIS — N184 Chronic kidney disease, stage 4 (severe): Secondary | ICD-10-CM | POA: Diagnosis not present

## 2021-09-07 DIAGNOSIS — R531 Weakness: Secondary | ICD-10-CM | POA: Diagnosis not present

## 2021-09-14 DIAGNOSIS — G301 Alzheimer's disease with late onset: Secondary | ICD-10-CM | POA: Diagnosis not present

## 2021-09-14 DIAGNOSIS — F0390 Unspecified dementia without behavioral disturbance: Secondary | ICD-10-CM | POA: Diagnosis not present

## 2021-09-15 ENCOUNTER — Emergency Department (HOSPITAL_COMMUNITY): Payer: Medicare PPO

## 2021-09-15 ENCOUNTER — Encounter (HOSPITAL_COMMUNITY): Payer: Self-pay

## 2021-09-15 ENCOUNTER — Emergency Department (HOSPITAL_COMMUNITY)
Admission: EM | Admit: 2021-09-15 | Discharge: 2021-09-15 | Disposition: A | Discharge status: Home / Self Care | Payer: Medicare PPO | Attending: Emergency Medicine | Admitting: Emergency Medicine

## 2021-09-15 ENCOUNTER — Other Ambulatory Visit: Payer: Self-pay

## 2021-09-15 DIAGNOSIS — Z20822 Contact with and (suspected) exposure to covid-19: Secondary | ICD-10-CM | POA: Diagnosis not present

## 2021-09-15 DIAGNOSIS — I10 Essential (primary) hypertension: Secondary | ICD-10-CM | POA: Diagnosis not present

## 2021-09-15 DIAGNOSIS — F0390 Unspecified dementia without behavioral disturbance: Secondary | ICD-10-CM | POA: Diagnosis not present

## 2021-09-15 DIAGNOSIS — N1831 Chronic kidney disease, stage 3a: Secondary | ICD-10-CM | POA: Insufficient documentation

## 2021-09-15 DIAGNOSIS — R4182 Altered mental status, unspecified: Secondary | ICD-10-CM | POA: Diagnosis not present

## 2021-09-15 DIAGNOSIS — I129 Hypertensive chronic kidney disease with stage 1 through stage 4 chronic kidney disease, or unspecified chronic kidney disease: Secondary | ICD-10-CM | POA: Diagnosis not present

## 2021-09-15 DIAGNOSIS — R404 Transient alteration of awareness: Secondary | ICD-10-CM | POA: Diagnosis not present

## 2021-09-15 DIAGNOSIS — R259 Unspecified abnormal involuntary movements: Secondary | ICD-10-CM | POA: Diagnosis not present

## 2021-09-15 LAB — COMPREHENSIVE METABOLIC PANEL
ALT: 13 U/L (ref 0–44)
AST: 27 U/L (ref 15–41)
Albumin: 3.5 g/dL (ref 3.5–5.0)
Alkaline Phosphatase: 75 U/L (ref 38–126)
Anion gap: 11 (ref 5–15)
BUN: 27 mg/dL — ABNORMAL HIGH (ref 8–23)
CO2: 21 mmol/L — ABNORMAL LOW (ref 22–32)
Calcium: 9.2 mg/dL (ref 8.9–10.3)
Chloride: 107 mmol/L (ref 98–111)
Creatinine, Ser: 1.59 mg/dL — ABNORMAL HIGH (ref 0.44–1.00)
GFR, Estimated: 31 mL/min — ABNORMAL LOW (ref >60)
Glucose, Bld: 85 mg/dL (ref 70–99)
Potassium: 5.3 mmol/L — ABNORMAL HIGH (ref 3.5–5.1)
Sodium: 139 mmol/L (ref 135–145)
Total Bilirubin: 1.4 mg/dL — ABNORMAL HIGH (ref 0.3–1.2)
Total Protein: 5.9 g/dL — ABNORMAL LOW (ref 6.5–8.1)

## 2021-09-15 LAB — URINALYSIS, ROUTINE W REFLEX MICROSCOPIC
Bilirubin Urine: NEGATIVE
Glucose, UA: NEGATIVE mg/dL
Hgb urine dipstick: NEGATIVE
Ketones, ur: NEGATIVE mg/dL
Leukocytes,Ua: NEGATIVE
Nitrite: NEGATIVE
Protein, ur: NEGATIVE mg/dL
Specific Gravity, Urine: 1.013 (ref 1.005–1.030)
pH: 5 (ref 5.0–8.0)

## 2021-09-15 LAB — CBC
HCT: 40.8 % (ref 36.0–46.0)
Hemoglobin: 13.1 g/dL (ref 12.0–15.0)
MCH: 28.4 pg (ref 26.0–34.0)
MCHC: 32.1 g/dL (ref 30.0–36.0)
MCV: 88.3 fL (ref 80.0–100.0)
Platelets: 296 10*3/uL (ref 150–400)
RBC: 4.62 MIL/uL (ref 3.87–5.11)
RDW: 14.2 % (ref 11.5–15.5)
WBC: 7.8 10*3/uL (ref 4.0–10.5)
nRBC: 0 % (ref 0.0–0.2)

## 2021-09-15 LAB — RESP PANEL BY RT-PCR (FLU A&B, COVID) ARPGX2
Influenza A by PCR: NEGATIVE
Influenza B by PCR: NEGATIVE
SARS Coronavirus 2 by RT PCR: NEGATIVE

## 2021-09-15 LAB — CBG MONITORING, ED: Glucose-Capillary: 92 mg/dL (ref 70–99)

## 2021-09-15 MED ORDER — SODIUM CHLORIDE 0.9 % IV BOLUS
500.0000 mL | Freq: Once | INTRAVENOUS | Status: AC
  Administered 2021-09-15: 500 mL via INTRAVENOUS

## 2021-09-15 NOTE — ED Provider Notes (Signed)
Emergency Department Provider Note   I have reviewed the triage vital signs and the nursing notes.   HISTORY  Chief Complaint Altered Mental Status   HPI [name] is a 88 y.o. female with past medical history reviewed below including renal insufficiency, dementia, hypertension presents to the emergency department with concern for altered mental status according to staff at [facility].  Staff this morning felt like she was not currently at her mental status baseline.  She was less conversive than reaching for things which did not seem present.  Ultimately, EMS was called.  The patient's son arrives at bedside tells me that he saw her last night and she seemed like her normal self.  He states that the patient is recently back in his life and so he is learning her medications and baseline mental status.  The patient tells me she is not sure why she is here.  She feels like she is ready to go home.  She denies any pain. Son unsure regarding any medication changes at [facility].     Past Medical History:  Diagnosis Date   CKD (chronic kidney disease) stage 3, GFR 30-59 ml/min (HCC)    Dementia (HCC)    Hypercholesterolemia    Hypertension    Mild acid reflux    Mitral valve prolapse    Renal insufficiency    Spinal stenosis    Staph infection     history of a wound infection following a previous right hip surgery   Upper GI bleed    duodenal ulcer (09/2015) s/p endo clipping    Review of Systems  Constitutional: No fever/chills Cardiovascular: Denies chest pain. Respiratory: Denies shortness of breath. Gastrointestinal: No abdominal pain.  Genitourinary: Negative for dysuria. Musculoskeletal: Negative for back pain. Skin: Negative for rash. Neurological: Negative for headaches.  ____________________________________________   PHYSICAL EXAM:  VITAL SIGNS: ED Triage Vitals  Enc Vitals Group     BP 09/15/21 0750 (!) 175/77     Pulse Rate 09/15/21 0750 62     Resp  09/15/21 0750 14     Temp 09/15/21 0750 98 F (36.7 C)     Temp Source 09/15/21 0750 Oral     SpO2 09/15/21 0744 97 %     Weight 09/15/21 0746 161 lb (73 kg)     Height 09/15/21 0746 '5\' 2"'$  (1.575 m)   Constitutional: Alert and conversational with mild confusion. Well appearing and in no acute distress. Eyes: Conjunctivae are normal.  Head: Atraumatic. Nose: No congestion/rhinnorhea. Mouth/Throat: Mucous membranes are moist.   Neck: No stridor.  Cardiovascular: Normal rate, regular rhythm. Good peripheral circulation. Grossly normal heart sounds.   Respiratory: Normal respiratory effort.  No retractions. Lungs CTAB. Gastrointestinal: Soft and nontender. No distention.  Musculoskeletal: No lower extremity tenderness nor edema. No gross deformities of extremities. Neurologic:  Normal speech and language. No gross focal neurologic deficits are appreciated.  Skin:  Skin is warm, dry and intact. No rash noted.   ____________________________________________   LABS (all labs ordered are listed, but only abnormal results are displayed)  Labs Reviewed  COMPREHENSIVE METABOLIC PANEL - Abnormal; Notable for the following components:      Result Value   Potassium 5.3 (*)    CO2 21 (*)    BUN 27 (*)    Creatinine, Ser 1.59 (*)    Total Protein 5.9 (*)    Total Bilirubin 1.4 (*)    GFR, Estimated 31 (*)    All other components within  normal limits  RESP PANEL BY RT-PCR (FLU A&B, COVID) ARPGX2  URINE CULTURE  CBC  URINALYSIS, ROUTINE W REFLEX MICROSCOPIC  CBG MONITORING, ED   ____________________________________________  EKG   EKG Interpretation  Date/Time:  Friday September 15 2021 07:54:02 EDT Ventricular Rate:  59 PR Interval:  210 QRS Duration: 72 QT Interval:  438 QTC Calculation: 434 R Axis:   49 Text Interpretation: Sinus rhythm Minimal ST elevation, inferior leads Confirmed by Nanda Quinton [phone]) on 09/15/2021 8:21:47 AM         ____________________________________________  RADIOLOGY  CT Head Wo Contrast  Result Date: 09/15/2021 CLINICAL DATA:  Provided history: Mental status change, unknown cause. EXAM: CT HEAD WITHOUT CONTRAST TECHNIQUE: Contiguous axial images were obtained from the base of the skull through the vertex without intravenous contrast. RADIATION DOSE REDUCTION: This exam was performed according to the departmental dose-optimization program which includes automated exposure control, adjustment of the mA and/or kV according to patient size and/or use of iterative reconstruction technique. COMPARISON:  Head CT 05/26/2016.  Brain MRI 09/08/2015. FINDINGS: Brain: Moderate generalized cerebral atrophy. Comparatively mild cerebellar atrophy. Advanced patchy and confluent hypoattenuation within the cerebral white matter, nonspecific but compatible with chronic small vessel ischemic disease. Redemonstrated arachnoid cyst within the anteroinferior left middle cranial fossa, measuring 3.9 x 2.9 cm in transaxial dimensions (for instance as seen on series 3, image 9). There is no acute intracranial hemorrhage. No demarcated cortical infarct. No extra-axial fluid collection. No evidence of an intracranial mass. No midline shift. Vascular: No hyperdense vessel.  Atherosclerotic calcifications. Skull: No fracture or aggressive osseous lesion. Small bony protuberance extending outward from the left frontal calvarium, likely reflecting an osteoma. Sinuses/Orbits: No mass or acute finding within the imaged orbits. Near complete opacification of the right maxillary sinus with at the imaged levels with associated chronic reactive osteitis. Mild-to-moderate mucosal thickening, and small fluid level, within the left maxillary sinus with associated chronic reactive osteitis. Small volume frothy secretions within the left sphenoid sinus. Mild mucosal thickening and fluid within the bilateral ethmoid air cells and frontoethmoidal  recesses. IMPRESSION: 1. No evidence of acute intracranial abnormality. 2. Advanced chronic small vessel ischemic changes within the cerebral white matter. 3. Moderate generalized cerebral atrophy. 4. Comparatively mild cerebellar atrophy. 5. Unchanged left middle cranial fossa arachnoid cyst. 6. Paranasal sinus disease, as described. Correlate for acute on chronic sinusitis. Electronically Signed   By: Kellie Simmering D.O.   On: 09/15/2021 09:14   DG Chest Portable 1 View  Result Date: 09/15/2021 CLINICAL DATA:  AMS EXAM: PORTABLE CHEST 1 VIEW COMPARISON:  June 5, 23. FINDINGS: No consolidation. No visible pleural effusions or pneumothorax. Cardiomediastinal silhouette is within normal limits. No acute osseous abnormality. IMPRESSION: No evidence of acute cardiopulmonary disease. Electronically Signed   By: Margaretha Sheffield M.D.   On: 09/15/2021 08:38    ____________________________________________   PROCEDURES  Procedure(s) performed:   Procedures  None  ____________________________________________   INITIAL IMPRESSION / ASSESSMENT AND PLAN / ED COURSE  Pertinent labs & imaging results that were available during my care of the patient were reviewed by me and considered in my medical decision making (see chart for details).   This patient is Presenting for Evaluation of AMS, which does require a range of treatment options, and is a complaint that involves a high risk of morbidity and mortality.  The Differential Diagnoses includes but is not exclusive to alcohol, illicit or prescription medications, intracranial pathology such as stroke, intracerebral hemorrhage, fever or infectious  causes including sepsis, hypoxemia, uremia, trauma, endocrine related disorders such as diabetes, hypoglycemia, thyroid-related diseases, etc.   Critical Interventions-    Medications  sodium chloride 0.9 % bolus 500 mL (0 mLs Intravenous Stopped 09/15/21 0955)    Reassessment after intervention: Patient  is at her mental status baseline per son.    I did obtain Additional Historical Information from son at bedside.  I decided to review pertinent External Data, and in summary patient was in the ED for placement and ultimately sent to [facility] on 6/9. Has since moved to [place].   Clinical Laboratory Tests Ordered, included UA without evidence of infection.  COVID and flu negative.  No acute kidney injury or severe electrolyte disturbance.  Potassium of 5.3 but slight hemolysis likely affecting this result.  No EKG changes to suspect severe hyperkalemia.  No anemia.  No leukocytosis.  Radiologic Tests Ordered, included CT head and CXR. I independently interpreted the images and agree with radiology interpretation.   Cardiac Monitor Tracing which shows NSR.   Social Determinants of Health Risk patient is a non-smoker.   Medical Decision Making: Summary:  Patient presents emergency department with concern for altered mental status.  She does not appear to be suffering from acute delirium on my initial exam.  Her son is here and will sit with her and help to determine if she is acting differently compared to her baseline mental status. Will move forward with testing and imaging as above.   Reevaluation with update and discussion with patient and son at bedside.  She has returned to her mental status baseline.  She has a reassuring work-up here.  No historical features or exam findings to strongly suspect stroke.  We will place a neurology consult for outpatient evaluation of neurocognitive decline and med optimization.  Patient has a PCP at [place] for her return there today.   Considered admission but patient remains at her mental status baseline with normal vital signs and reassuring work-up in the ED.  She has good primary care follow-up, assistance at the memory care center, and I will place referral for neurology to establish care locally.    Disposition:  discharge  ____________________________________________  FINAL CLINICAL IMPRESSION(S) / ED DIAGNOSES  Final diagnoses:  Transient alteration of awareness    Note:  This document was prepared using Dragon voice recognition software and may include unintentional dictation errors.  Nanda Quinton, MD, [facility] Emergency Medicine    Duyen Beckom, [facility], MD 09/15/21 [phone]

## 2021-09-15 NOTE — ED Triage Notes (Signed)
Brought in from [facility] and woke patient up about 530 and noticed by 0630 she was not at baseline.  Hx of Alzheimers.  Patient was reaching at things that werent there and shaking her head.  Couldn't tell tehm who she was or where she was.  Was supposed to have UA done today.  Unsure what is going on bc she has only been there approx 7 days.

## 2021-09-15 NOTE — Discharge Instructions (Signed)
You are seen in the emergency department today after some confusion this morning.  Your lab work, CT scan of the head, urine test all look normal.  I suspect this may be related to your underlying dementia diagnosis.  Please continue to follow with your primary care doctor.  I have also placed a referral in our system for neurology.  They should be reaching out to schedule a follow-up appointment.

## 2021-09-16 LAB — URINE CULTURE: Culture: NO GROWTH

## 2021-09-18 DIAGNOSIS — N1831 Chronic kidney disease, stage 3a: Secondary | ICD-10-CM | POA: Diagnosis not present

## 2021-09-18 DIAGNOSIS — F02B0 Dementia in other diseases classified elsewhere, moderate, without behavioral disturbance, psychotic disturbance, mood disturbance, and anxiety: Secondary | ICD-10-CM | POA: Diagnosis not present

## 2021-09-18 DIAGNOSIS — G301 Alzheimer's disease with late onset: Secondary | ICD-10-CM | POA: Diagnosis not present

## 2021-09-18 DIAGNOSIS — I129 Hypertensive chronic kidney disease with stage 1 through stage 4 chronic kidney disease, or unspecified chronic kidney disease: Secondary | ICD-10-CM | POA: Diagnosis not present

## 2021-09-18 DIAGNOSIS — K219 Gastro-esophageal reflux disease without esophagitis: Secondary | ICD-10-CM | POA: Diagnosis not present

## 2021-10-03 DIAGNOSIS — G309 Alzheimer's disease, unspecified: Secondary | ICD-10-CM | POA: Diagnosis not present

## 2021-10-03 DIAGNOSIS — I129 Hypertensive chronic kidney disease with stage 1 through stage 4 chronic kidney disease, or unspecified chronic kidney disease: Secondary | ICD-10-CM | POA: Diagnosis not present

## 2021-10-03 DIAGNOSIS — F0284 Dementia in other diseases classified elsewhere, unspecified severity, with anxiety: Secondary | ICD-10-CM | POA: Diagnosis not present

## 2021-10-09 ENCOUNTER — Ambulatory Visit: Payer: Medicare PPO | Admitting: Neurology

## 2021-10-11 DIAGNOSIS — G309 Alzheimer's disease, unspecified: Secondary | ICD-10-CM | POA: Diagnosis not present

## 2021-10-11 DIAGNOSIS — L98411 Non-pressure chronic ulcer of buttock limited to breakdown of skin: Secondary | ICD-10-CM | POA: Diagnosis not present

## 2021-10-25 ENCOUNTER — Encounter: Payer: Self-pay | Admitting: Neurology

## 2021-10-25 ENCOUNTER — Ambulatory Visit (INDEPENDENT_AMBULATORY_CARE_PROVIDER_SITE_OTHER): Payer: Medicare PPO | Admitting: Neurology

## 2021-10-25 VITALS — BP 124/76 | HR 61 | Ht 62.0 in | Wt 157.0 lb

## 2021-10-25 DIAGNOSIS — G301 Alzheimer's disease with late onset: Secondary | ICD-10-CM | POA: Diagnosis not present

## 2021-10-25 DIAGNOSIS — F02B0 Dementia in other diseases classified elsewhere, moderate, without behavioral disturbance, psychotic disturbance, mood disturbance, and anxiety: Secondary | ICD-10-CM

## 2021-10-25 NOTE — Progress Notes (Unsigned)
[facility]  PATIENT: [name] DOB: [birth year]  REQUESTING CLINICIAN: Long, Wonda Olds, MD HISTORY FROM: Son  REASON FOR VISIT: Worsening mental statut    HISTORICAL  CHIEF COMPLAINT:  Chief Complaint  Patient presents with   New Patient (Initial Visit)    Rm 12. Accompanied by son and daughter-in-law. NP internal referral for transient alteration of awareness.    HISTORY OF PRESENT ILLNESS:  This is a 88 year old woman past medical history of dementia, hypertension and hypothyroidism who is presenting with complaints of worsening mental status.  At baseline, patient does have a diagnosis of dementia, she is currently on Aricept and Namenda and lives at a assisted living facility which has a Marine scientist clinic, [place].  At baseline she does confuse her son with her brother.  She does have difficulty with memory mainly recent memory.  Daughter-in-law reports at the time she cannot even tell me what she had for lunch or breakfast.  Son reports that in the month of June patient was moved to [place] facility and 1 week after living in the place she was noted to be confused in 1 morning, she was not responding appropriately, and she was wishing for object that was not there.  The facility got concerned called EMS and patient was taken to the ED.  In the ED she had a basic infectious workup which was negative and she was sent back to [place].  Son reported that she is at baseline.    OTHER MEDICAL CONDITIONS: Dementia, Hypertension, hypothyroidism, hypertension  REVIEW OF SYSTEMS: Full 14 system review of systems performed and negative with exception of: As noted in the HPI   ALLERGIES: Allergies  Allergen Reactions   Demerol [Meperidine] Other (See Comments)    Altered mental status    HOME MEDICATIONS: Outpatient Medications Prior to Visit  Medication Sig Dispense Refill   acetaminophen (TYLENOL) 650 MG CR tablet Take 650 mg by mouth every 8 (eight)  hours as needed for pain.     amLODipine (NORVASC) 10 MG tablet TAKE 1 TABLET BY MOUTH EVERY DAY (Patient taking differently: Take 10 mg by mouth daily.) 90 tablet 1   donepezil (ARICEPT) 10 MG tablet TAKE 1 TABLET BY MOUTH EVERYDAY AT BEDTIME (Patient taking differently: Take 10 mg by mouth at bedtime.) 90 tablet 0   levothyroxine (SYNTHROID) 75 MCG tablet Take 75 mcg by mouth daily before breakfast.     memantine (NAMENDA) 10 MG tablet TAKE 1 TABLET BY MOUTH TWICE A DAY (Patient taking differently: Take 10 mg by mouth 2 (two) times daily.) 180 tablet 3   metoprolol tartrate (LOPRESSOR) 25 MG tablet TAKE 1 TABLET BY MOUTH TWICE A DAY (Patient taking differently: Take 25 mg by mouth 2 (two) times daily.) 180 tablet 1   pantoprazole (PROTONIX) 40 MG tablet TAKE 1 TABLET BY MOUTH EVERY DAY (Patient taking differently: Take 40 mg by mouth daily.) 90 tablet 1   citalopram (CELEXA) 20 MG tablet TAKE 1 TABLET DAILY (Patient not taking: Reported on 08/20/2021) 90 tablet 3   No facility-administered medications prior to visit.    PAST MEDICAL HISTORY: Past Medical History:  Diagnosis Date   CKD (chronic kidney disease) stage 3, GFR 30-59 ml/min (HCC)    Dementia (HCC)    Hypercholesterolemia    Hypertension    Mild acid reflux    Mitral valve prolapse    Renal insufficiency    Spinal stenosis    Staph infection     history of  a wound infection following a previous right hip surgery   Upper GI bleed    duodenal ulcer (09/2015) s/p endo clipping    PAST SURGICAL HISTORY: Past Surgical History:  Procedure Laterality Date   ABCESS DRAINAGE     ABDOMINAL HYSTERECTOMY     CATARACT EXTRACTION Left    COLONOSCOPY  04/30/01   WEX:HBZJIRCV hemorrhoids, otherwise, normal rectum/colon/Normal terminal ileum   COLONOSCOPY  03/02/2007   ELF:YBOF sigmoid colon diverticula.  Otherwise no polyps, masses/Normal terminal ileum.  Approximately 10-15 cm of the distal terminal ileum visualized/normal view of the  rectum   ESOPHAGOGASTRODUODENOSCOPY    03/01/2007   BPZ:WCHENI esophagus without evidence of Barrett's, mass/Mild erythema in the antrum.  Distorted pylorus consistent with prior ulcer disease.  Biopsies obtained/ 3. Junction of D1 and D2 erythematous, edematous, and strictured.  The lumen was narrowed to approximately 10 mm.    ESOPHAGOGASTRODUODENOSCOPY N/A 09/09/2015   Procedure: ESOPHAGOGASTRODUODENOSCOPY (EGD);  Surgeon: Jerene Bears, MD;  Location: [facility] END[facility]COPY;  Service: Endoscopy;  Laterality: N/A;   Left total hip replacement arthroplasty  June of 2001   Right breast lumpectomy     Right total hip replacement arthroplasty  August of 2001   TOTAL KNEE ARTHROPLASTY      FAMILY HISTORY: Family History  Problem Relation Age of Onset   Colon cancer Neg Hx     SOCIAL HISTORY: Social History   Socioeconomic History   Marital status: Widowed    Spouse name: Not on file   Number of children: Not on file   Years of education: Not on file   Highest education level: Not on file  Occupational History   Not on file  Tobacco Use   Smoking status: Never   Smokeless tobacco: Never  Substance and Sexual Activity   Alcohol use: No   Drug use: No   Sexual activity: Never    Comment: widowed  Other Topics Concern   Not on file  Social History Narrative   Not on file   Social Determinants of Health   Financial Resource Strain: Low Risk  (07/22/2020)   Overall Financial Resource Strain (CARDIA)    Difficulty of Paying Living Expenses: Not hard at all  Food Insecurity: No Food Insecurity (07/22/2020)   Hunger Vital Sign    Worried About Running Out of Food in the Last Year: Never true    [place] in the Last Year: Never true  Transportation Needs: No Transportation Needs (07/22/2020)   PRAPARE - Hydrologist (Medical): No    Lack of Transportation (Non-Medical): No  Physical Activity: Sufficiently Active (07/22/2020)   Exercise Vital Sign    Days of  Exercise per Week: 7 days    Minutes of Exercise per Session: 30 min  Stress: No Stress Concern Present (07/22/2020)   [place]    Feeling of Stress : Not at all  Social Connections: Socially Isolated (07/22/2020)   Social Connection and Isolation Panel [NHANES]    Frequency of Communication with Friends and Family: More than three times a week    Frequency of Social Gatherings with Friends and Family: More than three times a week    Attends Religious Services: Never    Marine scientist or Organizations: No    Attends Archivist Meetings: Never    Marital Status: Widowed  Intimate Partner Violence: Not on file    PHYSICAL EXAM  GENERAL EXAM/CONSTITUTIONAL: Vitals:  Vitals:   10/25/21 1527  BP: 124/76  Pulse: 61  Weight: 157 lb (71.2 kg)  Height: '5\' 2"'$  (1.575 m)   Body mass index is 28.72 kg/m. Wt Readings from Last 3 Encounters:  10/25/21 157 lb (71.2 kg)  09/15/21 161 lb (73 kg)  09/26/20 161 lb 6.4 oz (73.2 kg)   Patient is in no distress; well developed, nourished and groomed; neck is supple  EYES: Pupils round and reactive to light, Visual fields full to confrontation, Extraocular movements intacts,   MUSCULOSKELETAL: Gait, strength, tone, movements noted in Neurologic exam below  NEUROLOGIC: MENTAL STATUS:      No data to display         awake, alert, aware of self but not place. Referred to her son as my brother. Deficit in recent and remote memory. She is able to name simple object like thumb but cannot name knuckles. Knows how many quarters are in$1.75, skip one day when naming the days of the week backward.    CRANIAL NERVE:  2nd, 3rd, 4th, 6th - pupils equal and reactive to light, visual fields full to confrontation, extraocular muscles intact, no nystagmus 5th - facial sensation symmetric 7th - facial strength symmetric 8th - hearing intact 9th - palate elevates  symmetrically, uvula midline 11th - shoulder shrug symmetric 12th - tongue protrusion midline  MOTOR:  normal bulk and tone, full strength in the BUE, BLE  SENSORY:  normal and symmetric to light touch  COORDINATION:  finger-nose-finger, fine finger movements normal  REFLEXES:  deep tendon reflexes present and symmetric  GAIT/STATION:  normal   DIAGNOSTIC DATA (LABS, IMAGING, TESTING) - I reviewed patient records, labs, notes, testing and imaging myself where available.  Lab Results  Component Value Date   WBC 7.8 09/15/2021   HGB 13.1 09/15/2021   HCT 40.8 09/15/2021   MCV 88.3 09/15/2021   PLT 296 09/15/2021      Component Value Date/Time   NA 139 09/15/2021 0755   K 5.3 (H) 09/15/2021 0755   CL 107 09/15/2021 0755   CO2 21 (L) 09/15/2021 0755   GLUCOSE 85 09/15/2021 0755   BUN 27 (H) 09/15/2021 0755   CREATININE 1.59 (H) 09/15/2021 0755   CREATININE 1.62 (H) 09/28/2020 1220   CALCIUM 9.2 09/15/2021 0755   PROT 5.9 (L) 09/15/2021 0755   ALBUMIN 3.5 09/15/2021 0755   AST 27 09/15/2021 0755   ALT 13 09/15/2021 0755   ALKPHOS 75 09/15/2021 0755   BILITOT 1.4 (H) 09/15/2021 0755   GFRNONAA 31 (L) 09/15/2021 0755   GFRNONAA 24 (L) 06/06/2020 1147   GFRAA 28 (L) 06/06/2020 1147   Lab Results  Component Value Date   CHOL 149 05/23/2020   HDL 65 05/23/2020   LDLCALC 66 05/23/2020   TRIG 95 05/23/2020   CHOLHDL 2.3 05/23/2020   Lab Results  Component Value Date   HGBA1C 5.6 09/04/2015   No results found for: "VITAMINB12" Lab Results  Component Value Date   TSH 1.84 12/22/2015    Head CT 09/15/21 1. No evidence of acute intracranial abnormality. 2. Advanced chronic small vessel ischemic changes within the cerebral white matter. 3. Moderate generalized cerebral atrophy. 4. Comparatively mild cerebellar atrophy. 5. Unchanged left middle cranial fossa arachnoid cyst. 6. Paranasal sinus disease, as described. Correlate for acute on chronic  sinusitis    ASSESSMENT AND PLAN  88 y.o. year old female with dementia, hypertension, hypothyroidism who is presenting after one episode of confusion  and possibly visual hallucination in end of June.  Since then, son reports patient is back to her normal baseline.  She is already on Aricept and Namenda for her diagnosis of dementia.  At this time we will just continue current medications.  Advised son to contact me if she has worsening of her mental status but at first they need to rule out infection.  They are comfortable with plans.  Follow-up in one 1 year or sooner if worse.   1. Moderate late onset Alzheimer's dementia without behavioral disturbance, psychotic disturbance, mood disturbance, or anxiety (HCC)      Patient Instructions  Continue current medications  Add Ensure or Boost supplement with each meal  Follow up in 1 year or sooner if worse   No orders of the defined types were placed in this encounter.   No orders of the defined types were placed in this encounter.   Return in about 1 year (around 10/26/2022).  I have spent a total of 60 minutes dedicated to this patient today, preparing to see patient, performing a medically appropriate examination and evaluation, ordering tests and/or medications and procedures, and counseling and educating the patient/family/caregiver; independently interpreting result and communicating results to the family/patient/caregiver; and documenting clinical information in the electronic medical record.   Alric Ran, MD 10/25/2021, 9:30 PM  [facility] [address] [place], [place] [address] [phone]

## 2021-10-25 NOTE — Patient Instructions (Addendum)
Continue current medications  Add Ensure or Boost supplement with each meal  Follow up in 1 year or sooner if worse

## 2021-11-08 DIAGNOSIS — E038 Other specified hypothyroidism: Secondary | ICD-10-CM | POA: Diagnosis not present

## 2021-11-08 DIAGNOSIS — L98411 Non-pressure chronic ulcer of buttock limited to breakdown of skin: Secondary | ICD-10-CM | POA: Diagnosis not present

## 2021-11-23 ENCOUNTER — Other Ambulatory Visit: Payer: Self-pay | Admitting: Family Medicine

## 2021-12-11 DIAGNOSIS — L98411 Non-pressure chronic ulcer of buttock limited to breakdown of skin: Secondary | ICD-10-CM | POA: Diagnosis not present

## 2021-12-11 DIAGNOSIS — E038 Other specified hypothyroidism: Secondary | ICD-10-CM | POA: Diagnosis not present

## 2022-01-03 DIAGNOSIS — E038 Other specified hypothyroidism: Secondary | ICD-10-CM | POA: Diagnosis not present

## 2022-01-03 DIAGNOSIS — L98411 Non-pressure chronic ulcer of buttock limited to breakdown of skin: Secondary | ICD-10-CM | POA: Diagnosis not present

## 2022-02-20 DIAGNOSIS — N390 Urinary tract infection, site not specified: Secondary | ICD-10-CM | POA: Diagnosis not present

## 2022-03-22 DIAGNOSIS — Z79899 Other long term (current) drug therapy: Secondary | ICD-10-CM | POA: Diagnosis not present

## 2022-03-26 DIAGNOSIS — K219 Gastro-esophageal reflux disease without esophagitis: Secondary | ICD-10-CM | POA: Diagnosis not present

## 2022-03-26 DIAGNOSIS — I129 Hypertensive chronic kidney disease with stage 1 through stage 4 chronic kidney disease, or unspecified chronic kidney disease: Secondary | ICD-10-CM | POA: Diagnosis not present

## 2022-03-26 DIAGNOSIS — N184 Chronic kidney disease, stage 4 (severe): Secondary | ICD-10-CM | POA: Diagnosis not present

## 2022-03-26 DIAGNOSIS — F0284 Dementia in other diseases classified elsewhere, unspecified severity, with anxiety: Secondary | ICD-10-CM | POA: Diagnosis not present

## 2022-03-26 DIAGNOSIS — G309 Alzheimer's disease, unspecified: Secondary | ICD-10-CM | POA: Diagnosis not present

## 2022-04-02 ENCOUNTER — Emergency Department (HOSPITAL_COMMUNITY)
Admission: EM | Admit: 2022-04-02 | Discharge: 2022-04-03 | Disposition: A | Discharge status: Skilled Nursing Facility | Payer: Medicare PPO | Attending: Emergency Medicine | Admitting: Emergency Medicine

## 2022-04-02 ENCOUNTER — Emergency Department (HOSPITAL_COMMUNITY): Payer: Medicare PPO

## 2022-04-02 ENCOUNTER — Other Ambulatory Visit: Payer: Self-pay

## 2022-04-02 DIAGNOSIS — F0390 Unspecified dementia without behavioral disturbance: Secondary | ICD-10-CM | POA: Insufficient documentation

## 2022-04-02 DIAGNOSIS — B3731 Acute candidiasis of vulva and vagina: Secondary | ICD-10-CM | POA: Diagnosis not present

## 2022-04-02 DIAGNOSIS — J101 Influenza due to other identified influenza virus with other respiratory manifestations: Secondary | ICD-10-CM | POA: Diagnosis not present

## 2022-04-02 DIAGNOSIS — D72829 Elevated white blood cell count, unspecified: Secondary | ICD-10-CM | POA: Diagnosis not present

## 2022-04-02 DIAGNOSIS — Z20822 Contact with and (suspected) exposure to covid-19: Secondary | ICD-10-CM | POA: Insufficient documentation

## 2022-04-02 DIAGNOSIS — N189 Chronic kidney disease, unspecified: Secondary | ICD-10-CM | POA: Insufficient documentation

## 2022-04-02 DIAGNOSIS — R5383 Other fatigue: Secondary | ICD-10-CM | POA: Diagnosis present

## 2022-04-02 LAB — CBC WITH DIFFERENTIAL/PLATELET
Abs Immature Granulocytes: 0.1 10*3/uL — ABNORMAL HIGH (ref 0.00–0.07)
Basophils Absolute: 0 10*3/uL (ref 0.0–0.1)
Basophils Relative: 0 %
Eosinophils Absolute: 0.2 10*3/uL (ref 0.0–0.5)
Eosinophils Relative: 2 %
HCT: 40.9 % (ref 36.0–46.0)
Hemoglobin: 13.4 g/dL (ref 12.0–15.0)
Immature Granulocytes: 1 %
Lymphocytes Relative: 14 %
Lymphs Abs: 1.7 10*3/uL (ref 0.7–4.0)
MCH: 27.8 pg (ref 26.0–34.0)
MCHC: 32.8 g/dL (ref 30.0–36.0)
MCV: 84.9 fL (ref 80.0–100.0)
Monocytes Absolute: 1.1 10*3/uL — ABNORMAL HIGH (ref 0.1–1.0)
Monocytes Relative: 9 %
Neutro Abs: 8.8 10*3/uL — ABNORMAL HIGH (ref 1.7–7.7)
Neutrophils Relative %: 74 %
Platelets: 303 10*3/uL (ref 150–400)
RBC: 4.82 MIL/uL (ref 3.87–5.11)
RDW: 13.7 % (ref 11.5–15.5)
WBC: 11.9 10*3/uL — ABNORMAL HIGH (ref 4.0–10.5)
nRBC: 0 % (ref 0.0–0.2)

## 2022-04-02 LAB — COMPREHENSIVE METABOLIC PANEL
ALT: 30 U/L (ref 0–44)
AST: 32 U/L (ref 15–41)
Albumin: 3.2 g/dL — ABNORMAL LOW (ref 3.5–5.0)
Alkaline Phosphatase: 79 U/L (ref 38–126)
Anion gap: 13 (ref 5–15)
BUN: 29 mg/dL — ABNORMAL HIGH (ref 8–23)
CO2: 21 mmol/L — ABNORMAL LOW (ref 22–32)
Calcium: 8.9 mg/dL (ref 8.9–10.3)
Chloride: 105 mmol/L (ref 98–111)
Creatinine, Ser: 1.69 mg/dL — ABNORMAL HIGH (ref 0.44–1.00)
GFR, Estimated: 29 mL/min — ABNORMAL LOW (ref >60)
Glucose, Bld: 101 mg/dL — ABNORMAL HIGH (ref 70–99)
Potassium: 4.8 mmol/L (ref 3.5–5.1)
Sodium: 139 mmol/L (ref 135–145)
Total Bilirubin: 0.8 mg/dL (ref 0.3–1.2)
Total Protein: 6.5 g/dL (ref 6.5–8.1)

## 2022-04-02 LAB — MAGNESIUM: Magnesium: 2.2 mg/dL (ref 1.7–2.4)

## 2022-04-02 LAB — RESP PANEL BY RT-PCR (RSV, FLU A&B, COVID)  RVPGX2
Influenza A by PCR: POSITIVE — AB
Influenza B by PCR: NEGATIVE
Resp Syncytial Virus by PCR: NEGATIVE
SARS Coronavirus 2 by RT PCR: NEGATIVE

## 2022-04-02 LAB — TROPONIN I (HIGH SENSITIVITY): Troponin I (High Sensitivity): 8 ng/L (ref <18)

## 2022-04-02 NOTE — ED Provider Triage Note (Signed)
Emergency Medicine Provider Triage Evaluation Note  Deem P Mettler , a 89 y.o. female  was evaluated in triage.  Pt presents from [facility] memory unit.  Patient has been coughing for the past week.  Per SNF staff patient has not been eating well, has been weak over the past week.  She also has scabies according to SNF.  Alert and oriented x 1 which is patient's baseline according to EMS.  Review of Systems  Positive: As above Negative: As above  Physical Exam  There were no vitals taken for this visit. Gen:   Awake, no distress   Resp:  Normal effort  MSK:   Moves extremities without difficulty  Other:   Medical Decision Making  Medically screening exam initiated at 8:30 PM.  Appropriate orders placed.  Kahleah P Hofman was informed that the remainder of the evaluation will be completed by another provider, this initial triage assessment does not replace that evaluation, and the importance of remaining in the ED until their evaluation is complete.     Evlyn Courier, PA-C 04/02/22 2032

## 2022-04-02 NOTE — ED Triage Notes (Signed)
Pt arrives EMS from [place] with reports of increase fatigue and weakness. Pt has been having diarrhea. Pt has scabies per EMS. Pt oriented x1 at baseline per EMS

## 2022-04-02 NOTE — ED Provider Notes (Signed)
[facility] DEPT Provider Note   CSN: [id number] Arrival date & time: 04/02/22  2017     History {Add pertinent medical, surgical, social history, OB history to HPI:1} Chief Complaint  Patient presents with   Fatigue    [name] is a 89 y.o. female.  HPI     Home Medications Prior to Admission medications   Medication Sig Start Date End Date Taking? Authorizing Provider  acetaminophen (TYLENOL) 650 MG CR tablet Take 650 mg by mouth every 8 (eight) hours as needed for pain.    [provider]  amLODipine (NORVASC) 10 MG tablet TAKE 1 TABLET BY MOUTH EVERY DAY Patient taking differently: Take 10 mg by mouth daily. 05/15/21   Susy Frizzle, MD  donepezil (ARICEPT) 10 MG tablet TAKE 1 TABLET BY MOUTH EVERYDAY AT BEDTIME Patient taking differently: Take 10 mg by mouth at bedtime. 07/06/21   Susy Frizzle, MD  levothyroxine (SYNTHROID) 75 MCG tablet Take 75 mcg by mouth daily before breakfast.    [provider]  memantine (NAMENDA) 10 MG tablet TAKE 1 TABLET BY MOUTH TWICE A DAY Patient taking differently: Take 10 mg by mouth 2 (two) times daily. 06/22/21   Susy Frizzle, MD  metoprolol tartrate (LOPRESSOR) 25 MG tablet TAKE 1 TABLET BY MOUTH TWICE A DAY Patient taking differently: Take 25 mg by mouth 2 (two) times daily. 06/15/21   Susy Frizzle, MD  pantoprazole (PROTONIX) 40 MG tablet TAKE 1 TABLET BY MOUTH EVERY DAY Patient taking differently: Take 40 mg by mouth daily. 06/15/21   Susy Frizzle, MD      Allergies    Demerol [meperidine]    Review of Systems   Review of Systems  Physical Exam Updated Vital Signs BP 139/77   Pulse 77   Temp 98.9 F (37.2 C) (Oral)   Resp 18   SpO2 100%  Physical Exam  ED Results / Procedures / Treatments   Labs (all labs ordered are listed, but only abnormal results are displayed) Labs Reviewed  RESP PANEL BY RT-PCR (RSV, FLU A&B, COVID)  RVPGX2 - Abnormal;  Notable for the following components:      Result Value   Influenza A by PCR POSITIVE (*)    All other components within normal limits  CBC WITH DIFFERENTIAL/PLATELET - Abnormal; Notable for the following components:   WBC 11.9 (*)    Neutro Abs 8.8 (*)    Monocytes Absolute 1.1 (*)    Abs Immature Granulocytes 0.10 (*)    All other components within normal limits  COMPREHENSIVE METABOLIC PANEL - Abnormal; Notable for the following components:   CO2 21 (*)    Glucose, Bld 101 (*)    BUN 29 (*)    Creatinine, Ser 1.69 (*)    Albumin 3.2 (*)    GFR, Estimated 29 (*)    All other components within normal limits  MAGNESIUM  URINALYSIS, ROUTINE W REFLEX MICROSCOPIC  TROPONIN I (HIGH SENSITIVITY)  TROPONIN I (HIGH SENSITIVITY)    EKG None  Radiology DG Chest Portable 1 View  Result Date: 04/02/2022 CLINICAL DATA:  Cough fatigue EXAM: PORTABLE CHEST 1 VIEW COMPARISON:  09/15/2021, chest CT 09/03/2015 FINDINGS: No acute airspace disease or effusion. Exaggerated cardiomediastinal silhouette likely related to marked patient rotation. No visible pneumothorax. IMPRESSION: No active disease. Electronically Signed   By: Donavan Foil M.D.   On: 04/02/2022 21:01    Procedures Procedures  {Document cardiac monitor, telemetry assessment  procedure when appropriate:1}  Medications Ordered in ED Medications - No data to display  ED Course/ Medical Decision Making/ A&P   {   Click here for ABCD2, HEART and other calculatorsREFRESH Note before signing :1}                          Medical Decision Making  ***  {Document critical care time when appropriate:1} {Document review of labs and clinical decision tools ie heart score, Chads2Vasc2 etc:1}  {Document your independent review of radiology images, and any outside records:1} {Document your discussion with family members, caretakers, and with consultants:1} {Document social determinants of health affecting pt's care:1} {Document your  decision making why or why not admission, treatments were needed:1} Final Clinical Impression(s) / ED Diagnoses Final diagnoses:  None    Rx / DC Orders ED Discharge Orders     None

## 2022-04-03 ENCOUNTER — Emergency Department (HOSPITAL_COMMUNITY): Payer: Medicare PPO

## 2022-04-03 LAB — URINALYSIS, ROUTINE W REFLEX MICROSCOPIC
Bilirubin Urine: NEGATIVE
Glucose, UA: NEGATIVE mg/dL
Hgb urine dipstick: NEGATIVE
Ketones, ur: 5 mg/dL — AB
Leukocytes,Ua: NEGATIVE
Nitrite: NEGATIVE
Protein, ur: 30 mg/dL — AB
Specific Gravity, Urine: 1.019 (ref 1.005–1.030)
pH: 5 (ref 5.0–8.0)

## 2022-04-03 LAB — TROPONIN I (HIGH SENSITIVITY): Troponin I (High Sensitivity): 7 ng/L (ref <18)

## 2022-04-03 MED ORDER — NYSTATIN 100000 UNIT/GM EX POWD: 1.0000 | Freq: Three times a day (TID) | CUTANEOUS | 0 refills | Status: DC

## 2022-04-03 MED ORDER — OSELTAMIVIR PHOSPHATE 30 MG PO CAPS
30.0000 mg | ORAL_CAPSULE | Freq: Once | ORAL | Status: AC
  Administered 2022-04-03: 30 mg via ORAL
  Filled 2022-04-03: qty 1

## 2022-04-03 MED ORDER — OSELTAMIVIR PHOSPHATE 30 MG PO CAPS: 30.0000 mg | ORAL_CAPSULE | Freq: Every day | ORAL | 0 refills | Status: DC

## 2022-04-03 NOTE — ED Notes (Signed)
This Pt is very raw and sore in the genital area. Pt had legs closed tightly and did not want this RN to touch her vaginal area.

## 2022-04-03 NOTE — Discharge Instructions (Addendum)
Bruna has influenza.  Based on her kidney function she should take Tamiflu 30 mg daily for a total of 5 days.  She received her first dose in the emergency department and her next dose will be on January 17.  There is no evidence of urinary tract infection or pneumonia.  Please ensure she drinks plenty of fluids.  She does have some irritation in her groin and needs to have frequent checks to make sure her diaper is dry.  Please have her rechecked if there are new or concerning symptoms.

## 2022-04-04 LAB — URINE CULTURE: Culture: NO GROWTH

## 2023-04-12 ENCOUNTER — Inpatient Hospital Stay (HOSPITAL_COMMUNITY): Payer: Medicare PPO

## 2023-04-12 ENCOUNTER — Other Ambulatory Visit: Payer: Self-pay

## 2023-04-12 ENCOUNTER — Inpatient Hospital Stay (HOSPITAL_COMMUNITY)
Admission: EM | Admit: 2023-04-12 | Discharge: 2023-04-17 | DRG: 193 | Disposition: A | Discharge status: Skilled Nursing Facility | Payer: Medicare PPO | Source: Skilled Nursing Facility | Attending: Internal Medicine | Admitting: Internal Medicine

## 2023-04-12 ENCOUNTER — Encounter (HOSPITAL_COMMUNITY): Payer: Self-pay | Admitting: Emergency Medicine

## 2023-04-12 ENCOUNTER — Emergency Department (HOSPITAL_COMMUNITY): Payer: Medicare PPO

## 2023-04-12 DIAGNOSIS — K219 Gastro-esophageal reflux disease without esophagitis: Secondary | ICD-10-CM | POA: Diagnosis present

## 2023-04-12 DIAGNOSIS — Z8711 Personal history of peptic ulcer disease: Secondary | ICD-10-CM | POA: Diagnosis not present

## 2023-04-12 DIAGNOSIS — Z91048 Other nonmedicinal substance allergy status: Secondary | ICD-10-CM

## 2023-04-12 DIAGNOSIS — Z885 Allergy status to narcotic agent status: Secondary | ICD-10-CM | POA: Diagnosis not present

## 2023-04-12 DIAGNOSIS — N179 Acute kidney failure, unspecified: Secondary | ICD-10-CM | POA: Diagnosis present

## 2023-04-12 DIAGNOSIS — L89312 Pressure ulcer of right buttock, stage 2: Secondary | ICD-10-CM | POA: Diagnosis present

## 2023-04-12 DIAGNOSIS — I129 Hypertensive chronic kidney disease with stage 1 through stage 4 chronic kidney disease, or unspecified chronic kidney disease: Secondary | ICD-10-CM | POA: Diagnosis present

## 2023-04-12 DIAGNOSIS — Z79899 Other long term (current) drug therapy: Secondary | ICD-10-CM | POA: Diagnosis not present

## 2023-04-12 DIAGNOSIS — Z1152 Encounter for screening for COVID-19: Secondary | ICD-10-CM

## 2023-04-12 DIAGNOSIS — J1000 Influenza due to other identified influenza virus with unspecified type of pneumonia: Principal | ICD-10-CM | POA: Diagnosis present

## 2023-04-12 DIAGNOSIS — E872 Acidosis, unspecified: Secondary | ICD-10-CM | POA: Diagnosis present

## 2023-04-12 DIAGNOSIS — I341 Nonrheumatic mitral (valve) prolapse: Secondary | ICD-10-CM | POA: Diagnosis present

## 2023-04-12 DIAGNOSIS — J189 Pneumonia, unspecified organism: Secondary | ICD-10-CM

## 2023-04-12 DIAGNOSIS — J9601 Acute respiratory failure with hypoxia: Secondary | ICD-10-CM | POA: Diagnosis present

## 2023-04-12 DIAGNOSIS — N1832 Chronic kidney disease, stage 3b: Secondary | ICD-10-CM | POA: Diagnosis present

## 2023-04-12 DIAGNOSIS — J45901 Unspecified asthma with (acute) exacerbation: Secondary | ICD-10-CM | POA: Diagnosis present

## 2023-04-12 DIAGNOSIS — Z96643 Presence of artificial hip joint, bilateral: Secondary | ICD-10-CM | POA: Diagnosis present

## 2023-04-12 DIAGNOSIS — E7800 Pure hypercholesterolemia, unspecified: Secondary | ICD-10-CM | POA: Diagnosis present

## 2023-04-12 DIAGNOSIS — Z79890 Hormone replacement therapy (postmenopausal): Secondary | ICD-10-CM

## 2023-04-12 DIAGNOSIS — J101 Influenza due to other identified influenza virus with other respiratory manifestations: Secondary | ICD-10-CM | POA: Diagnosis not present

## 2023-04-12 DIAGNOSIS — Z90710 Acquired absence of both cervix and uterus: Secondary | ICD-10-CM | POA: Diagnosis not present

## 2023-04-12 DIAGNOSIS — L8990 Pressure ulcer of unspecified site, unspecified stage: Secondary | ICD-10-CM | POA: Insufficient documentation

## 2023-04-12 DIAGNOSIS — R7989 Other specified abnormal findings of blood chemistry: Secondary | ICD-10-CM

## 2023-04-12 DIAGNOSIS — Z96659 Presence of unspecified artificial knee joint: Secondary | ICD-10-CM | POA: Diagnosis present

## 2023-04-12 DIAGNOSIS — E039 Hypothyroidism, unspecified: Secondary | ICD-10-CM | POA: Diagnosis present

## 2023-04-12 DIAGNOSIS — L89212 Pressure ulcer of right hip, stage 2: Secondary | ICD-10-CM | POA: Diagnosis present

## 2023-04-12 DIAGNOSIS — G9340 Encephalopathy, unspecified: Secondary | ICD-10-CM

## 2023-04-12 DIAGNOSIS — F0390 Unspecified dementia without behavioral disturbance: Secondary | ICD-10-CM | POA: Diagnosis present

## 2023-04-12 LAB — CBC WITH DIFFERENTIAL/PLATELET
Abs Immature Granulocytes: 0.1 10*3/uL — ABNORMAL HIGH (ref 0.00–0.07)
Basophils Absolute: 0 10*3/uL (ref 0.0–0.1)
Basophils Relative: 0 %
Eosinophils Absolute: 0 10*3/uL (ref 0.0–0.5)
Eosinophils Relative: 0 %
HCT: 40.2 % (ref 36.0–46.0)
Hemoglobin: 12.5 g/dL (ref 12.0–15.0)
Immature Granulocytes: 1 %
Lymphocytes Relative: 19 %
Lymphs Abs: 2.6 10*3/uL (ref 0.7–4.0)
MCH: 28.2 pg (ref 26.0–34.0)
MCHC: 31.1 g/dL (ref 30.0–36.0)
MCV: 90.7 fL (ref 80.0–100.0)
Monocytes Absolute: 1.4 10*3/uL — ABNORMAL HIGH (ref 0.1–1.0)
Monocytes Relative: 10 %
Neutro Abs: 9.6 10*3/uL — ABNORMAL HIGH (ref 1.7–7.7)
Neutrophils Relative %: 70 %
Platelets: 294 10*3/uL (ref 150–400)
RBC: 4.43 MIL/uL (ref 3.87–5.11)
RDW: 14.2 % (ref 11.5–15.5)
WBC: 13.7 10*3/uL — ABNORMAL HIGH (ref 4.0–10.5)
nRBC: 0 % (ref 0.0–0.2)

## 2023-04-12 LAB — I-STAT CG4 LACTIC ACID, ED
Lactic Acid, Venous: 2 mmol/L (ref 0.5–1.9)
Lactic Acid, Venous: 2.7 mmol/L (ref 0.5–1.9)

## 2023-04-12 LAB — BLOOD GAS, VENOUS
Acid-Base Excess: 1.5 mmol/L (ref 0.0–2.0)
Bicarbonate: 28.5 mmol/L — ABNORMAL HIGH (ref 20.0–28.0)
O2 Saturation: 63.1 %
Patient temperature: 37
pCO2, Ven: 54 mm[Hg] (ref 44–60)
pH, Ven: 7.33 (ref 7.25–7.43)
pO2, Ven: 35 mm[Hg] (ref 32–45)

## 2023-04-12 LAB — URINALYSIS, W/ REFLEX TO CULTURE (INFECTION SUSPECTED)
Bilirubin Urine: NEGATIVE
Glucose, UA: NEGATIVE mg/dL
Hgb urine dipstick: NEGATIVE
Ketones, ur: NEGATIVE mg/dL
Leukocytes,Ua: NEGATIVE
Nitrite: NEGATIVE
Protein, ur: 100 mg/dL — AB
Specific Gravity, Urine: 1.019 (ref 1.005–1.030)
pH: 5 (ref 5.0–8.0)

## 2023-04-12 LAB — COMPREHENSIVE METABOLIC PANEL
ALT: 20 U/L (ref 0–44)
AST: 27 U/L (ref 15–41)
Albumin: 3.8 g/dL (ref 3.5–5.0)
Alkaline Phosphatase: 94 U/L (ref 38–126)
Anion gap: 15 (ref 5–15)
BUN: 41 mg/dL — ABNORMAL HIGH (ref 8–23)
CO2: 23 mmol/L (ref 22–32)
Calcium: 9.2 mg/dL (ref 8.9–10.3)
Chloride: 100 mmol/L (ref 98–111)
Creatinine, Ser: 2.27 mg/dL — ABNORMAL HIGH (ref 0.44–1.00)
GFR, Estimated: 20 mL/min — ABNORMAL LOW (ref >60)
Glucose, Bld: 128 mg/dL — ABNORMAL HIGH (ref 70–99)
Potassium: 4 mmol/L (ref 3.5–5.1)
Sodium: 138 mmol/L (ref 135–145)
Total Bilirubin: 0.5 mg/dL (ref 0.0–1.2)
Total Protein: 7.9 g/dL (ref 6.5–8.1)

## 2023-04-12 LAB — LACTIC ACID, PLASMA: Lactic Acid, Venous: 4.2 mmol/L (ref 0.5–1.9)

## 2023-04-12 LAB — PROTIME-INR
INR: 1.1 (ref 0.8–1.2)
Prothrombin Time: 14.2 s (ref 11.4–15.2)

## 2023-04-12 LAB — MAGNESIUM: Magnesium: 2.1 mg/dL (ref 1.7–2.4)

## 2023-04-12 LAB — APTT: aPTT: 31 s (ref 24–36)

## 2023-04-12 LAB — RESP PANEL BY RT-PCR (RSV, FLU A&B, COVID)  RVPGX2
Influenza A by PCR: POSITIVE — AB
Influenza B by PCR: NEGATIVE
Resp Syncytial Virus by PCR: NEGATIVE
SARS Coronavirus 2 by RT PCR: NEGATIVE

## 2023-04-12 LAB — PHOSPHORUS: Phosphorus: 3.9 mg/dL (ref 2.5–4.6)

## 2023-04-12 LAB — PROCALCITONIN: Procalcitonin: 0.43 ng/mL

## 2023-04-12 LAB — BRAIN NATRIURETIC PEPTIDE: B Natriuretic Peptide: 95.5 pg/mL (ref 0.0–100.0)

## 2023-04-12 MED ORDER — SODIUM CHLORIDE 0.9 % IV BOLUS
500.0000 mL | INTRAVENOUS | Status: AC
  Administered 2023-04-12: 500 mL via INTRAVENOUS

## 2023-04-12 MED ORDER — SODIUM CHLORIDE 0.9 % IV SOLN
1.0000 g | INTRAVENOUS | Status: AC
  Administered 2023-04-12 – 2023-04-16 (×5): 1 g via INTRAVENOUS
  Filled 2023-04-12 (×5): qty 10

## 2023-04-12 MED ORDER — DONEPEZIL HCL 10 MG PO TABS
10.0000 mg | ORAL_TABLET | Freq: Every day | ORAL | Status: DC
  Administered 2023-04-12 – 2023-04-16 (×5): 10 mg via ORAL
  Filled 2023-04-12 (×5): qty 1

## 2023-04-12 MED ORDER — DEXTROSE 5 % AND 0.9 % NACL IV BOLUS
1000.0000 mL | Freq: Once | INTRAVENOUS | Status: AC
  Administered 2023-04-12: 1000 mL via INTRAVENOUS

## 2023-04-12 MED ORDER — PANTOPRAZOLE SODIUM 40 MG PO TBEC
40.0000 mg | DELAYED_RELEASE_TABLET | Freq: Every day | ORAL | Status: DC
  Administered 2023-04-13 – 2023-04-17 (×5): 40 mg via ORAL
  Filled 2023-04-12 (×5): qty 1

## 2023-04-12 MED ORDER — ALBUTEROL SULFATE (2.5 MG/3ML) 0.083% IN NEBU: 2.5000 mg | INHALATION_SOLUTION | RESPIRATORY_TRACT | Status: DC | PRN

## 2023-04-12 MED ORDER — COLESTIPOL HCL 1 G PO TABS
1.0000 g | ORAL_TABLET | Freq: Every day | ORAL | Status: DC
  Administered 2023-04-13 – 2023-04-17 (×5): 1 g via ORAL
  Filled 2023-04-12 (×5): qty 1

## 2023-04-12 MED ORDER — ONDANSETRON HCL 4 MG PO TABS: 4.0000 mg | ORAL_TABLET | Freq: Four times a day (QID) | ORAL | Status: DC | PRN

## 2023-04-12 MED ORDER — LEVOTHYROXINE SODIUM 75 MCG PO TABS
75.0000 ug | ORAL_TABLET | Freq: Every day | ORAL | Status: DC
  Administered 2023-04-13 – 2023-04-17 (×5): 75 ug via ORAL
  Filled 2023-04-12 (×5): qty 1

## 2023-04-12 MED ORDER — PREDNISONE 20 MG PO TABS
40.0000 mg | ORAL_TABLET | Freq: Every day | ORAL | Status: AC
  Administered 2023-04-13 – 2023-04-17 (×5): 40 mg via ORAL
  Filled 2023-04-12 (×5): qty 2

## 2023-04-12 MED ORDER — ACETAMINOPHEN 650 MG RE SUPP: 650.0000 mg | Freq: Four times a day (QID) | RECTAL | Status: DC | PRN

## 2023-04-12 MED ORDER — FAMOTIDINE 20 MG PO TABS
10.0000 mg | ORAL_TABLET | Freq: Every day | ORAL | Status: DC
  Administered 2023-04-13 – 2023-04-17 (×5): 10 mg via ORAL
  Filled 2023-04-12 (×5): qty 1

## 2023-04-12 MED ORDER — MEMANTINE HCL 10 MG PO TABS
5.0000 mg | ORAL_TABLET | Freq: Two times a day (BID) | ORAL | Status: DC
  Administered 2023-04-12 – 2023-04-17 (×10): 5 mg via ORAL
  Filled 2023-04-12 (×10): qty 1

## 2023-04-12 MED ORDER — ACETAMINOPHEN 325 MG PO TABS: 650.0000 mg | ORAL_TABLET | Freq: Four times a day (QID) | ORAL | Status: DC | PRN

## 2023-04-12 MED ORDER — ONDANSETRON HCL 4 MG/2ML IJ SOLN: 4.0000 mg | Freq: Four times a day (QID) | INTRAMUSCULAR | Status: DC | PRN

## 2023-04-12 MED ORDER — HEPARIN SODIUM (PORCINE) 5000 UNIT/ML IJ SOLN
5000.0000 [IU] | Freq: Three times a day (TID) | INTRAMUSCULAR | Status: DC
  Administered 2023-04-12 – 2023-04-17 (×14): 5000 [IU] via SUBCUTANEOUS
  Filled 2023-04-12 (×14): qty 1

## 2023-04-12 MED ORDER — SODIUM CHLORIDE 0.9 % IV BOLUS
500.0000 mL | Freq: Once | INTRAVENOUS | Status: AC
  Administered 2023-04-12: 500 mL via INTRAVENOUS

## 2023-04-12 MED ORDER — SODIUM CHLORIDE 0.9 % IV SOLN
500.0000 mg | INTRAVENOUS | Status: DC
  Administered 2023-04-12 – 2023-04-14 (×3): 500 mg via INTRAVENOUS
  Filled 2023-04-12 (×5): qty 5

## 2023-04-12 NOTE — H&P (Signed)
History and Physical    Patient: [name] HQI:[id number] DOB: 1932/07/03 DOA: 04/12/2023 DOS: the patient was seen and examined on 04/12/2023 PCP: Pcp, No  Patient coming from: SNF  Chief Complaint:  Chief Complaint  Patient presents with   Shortness of Breath   HPI: [name] is a 89 y.o. female with medical history significant of stage 3b CKD, dementia, hyperlipidemia, hypertension, mild GERD, mitral valve prolapse, spinal stenosis, duodenal PUD, upper GI bleed with ABLA, elevated LFTs, food impaction of the esophagus, aspiration pneumonia, asthma who was diagnosed earlier this month with influenza and treated with Tamiflu at her skilled nursing facility and was found hypoxic this morning prompting an EMS call with the patient being hypoxic with an O2 sat of 85% when they arrived.  Her O2 saturation improved with bronchodilators.  She also received 125 mg of methylprednisolone.  She is unable to provide further history at the moment.  Lab work: Urinalysis was hazy with protein of 100 mg/dL and many bacteria on microscopic examination, but no increase in WBC or RBC.  CBC showed a white count of 13.7 with 70% neutrophils, hemoglobin 12.5 g/dL platelets 132.  Normal PT, INR and PTT.  Venous blood gas showed an elevated bicarbonate of 28.5 mmol/L, but was otherwise normal.  Lactic acid was 2.0 and 2.7 mmol/L.  Influenza A PCR was positive, but negative influenza B, COVID-19 and RSV.  BNP 95.5 pg/mL.  CMP showed a glucose of 128, BUN 41 and creatinine 2.27 mg/dL, the rest of the CMP measurements were normal.  Imaging: Portable 1 view chest radiograph was degraded by artifact and position.  CT chest without contrast still pending.   ED course: Initial vital signs were temperature 98.5 F, pulse 71, respiration 18, BP 119/66 mmHg and O2 sat 99% on room air.  The patient received 500 mL normal saline bolus.  Review of Systems: As mentioned in the history of present illness. All other  systems reviewed and are negative. Past Medical History:  Diagnosis Date   CKD (chronic kidney disease) stage 3, GFR 30-59 ml/min (HCC)    Dementia (HCC)    Hypercholesterolemia    Hypertension    Mild acid reflux    Mitral valve prolapse    Renal insufficiency    Spinal stenosis    Staph infection     history of a wound infection following a previous right hip surgery   Upper GI bleed    duodenal ulcer (09/2015) s/p endo clipping   Past Surgical History:  Procedure Laterality Date   ABCESS DRAINAGE     ABDOMINAL HYSTERECTOMY     CATARACT EXTRACTION Left    COLONOSCOPY  04/30/01   GMW:NUUVOZDG hemorrhoids, otherwise, normal rectum/colon/Normal terminal ileum   COLONOSCOPY  03/02/2007   UYQ:IHKV sigmoid colon diverticula.  Otherwise no polyps, masses/Normal terminal ileum.  Approximately 10-15 cm of the distal terminal ileum visualized/normal view of the rectum   ESOPHAGOGASTRODUODENOSCOPY    03/01/2007   QQV:ZDGLOV esophagus without evidence of Barrett's, mass/Mild erythema in the antrum.  Distorted pylorus consistent with prior ulcer disease.  Biopsies obtained/ 3. Junction of D1 and D2 erythematous, edematous, and strictured.  The lumen was narrowed to approximately 10 mm.    ESOPHAGOGASTRODUODENOSCOPY N/A 09/09/2015   Procedure: ESOPHAGOGASTRODUODENOSCOPY (EGD);  Surgeon: Beverley Fiedler, MD;  Location: [facility] ENDOSCOPY;  Service: Endoscopy;  Laterality: N/A;   Left total hip replacement arthroplasty  June of 2001   Right breast lumpectomy     Right total  hip replacement arthroplasty  August of 2001   TOTAL KNEE ARTHROPLASTY     Social History:  reports that she has never smoked. She has never used smokeless tobacco. She reports that she does not drink alcohol and does not use drugs.  Allergies  Allergen Reactions   Demerol [Meperidine] Other (See Comments)    Altered mental status    Family History  Problem Relation Age of Onset   Colon cancer Neg Hx     Prior to Admission  medications   Medication Sig Start Date End Date Taking? Authorizing Provider  acetaminophen (TYLENOL) 500 MG tablet Take 1,000 mg by mouth in the morning, at noon, and at bedtime.   Yes [provider]  amLODipine (NORVASC) 10 MG tablet TAKE 1 TABLET BY MOUTH EVERY DAY Patient taking differently: Take 10 mg by mouth daily. 05/15/21  Yes Donita Brooks, MD  colestipol (COLESTID) 1 g tablet Take 1 g by mouth in the morning.   Yes [provider]  docusate sodium (COLACE) 100 MG capsule Take 100 mg by mouth 2 (two) times daily as needed for mild constipation or moderate constipation.   Yes [provider]  donepezil (ARICEPT) 10 MG tablet TAKE 1 TABLET BY MOUTH EVERYDAY AT BEDTIME Patient taking differently: Take 10 mg by mouth at bedtime. 07/06/21  Yes Donita Brooks, MD  famotidine (PEPCID) 20 MG tablet Take 20 mg by mouth in the morning.   Yes [provider]  levothyroxine (SYNTHROID) 75 MCG tablet Take 75 mcg by mouth daily before breakfast.   Yes [provider]  memantine (NAMENDA) 10 MG tablet TAKE 1 TABLET BY MOUTH TWICE A DAY Patient taking differently: Take 10 mg by mouth 2 (two) times daily. 06/22/21  Yes Donita Brooks, MD  metoprolol tartrate (LOPRESSOR) 25 MG tablet TAKE 1 TABLET BY MOUTH TWICE A DAY Patient taking differently: Take 25 mg by mouth 2 (two) times daily. 06/15/21  Yes Donita Brooks, MD  miconazole (BAZA ANTIFUNGAL) 2 % cream Apply 1 Application topically See admin instructions. 1 application to buttocks every shift   Yes [provider]  NON FORMULARY Take 1 Dose by mouth in the morning, at noon, and at bedtime. Magic Cup   Yes [provider]  nystatin powder Apply 1 Application topically 3 (three) times daily. 04/03/22   Tilden Fossa, MD  oseltamivir (TAMIFLU) 30 MG capsule Take 1 capsule (30 mg total) by mouth daily. 04/03/22   Tilden Fossa, MD  pantoprazole (PROTONIX) 40 MG tablet TAKE 1  TABLET BY MOUTH EVERY DAY Patient taking differently: Take 40 mg by mouth daily. 06/15/21   Donita Brooks, MD    Physical Exam: Vitals:   04/12/23 1202 04/12/23 1210  BP: 119/66   Pulse: 71   Resp: 18   Temp: 98.5 F (36.9 C)   TempSrc: Oral   SpO2: 99%   Weight:  71.2 kg  Height:  5\' 2"  (1.575 m)   Physical Exam Vitals and nursing note reviewed.  Constitutional:      General: She is awake. She is not in acute distress.    Appearance: She is well-developed. She is not ill-appearing.  HENT:     Head: Normocephalic.     Nose: No rhinorrhea.  Eyes:     General: No scleral icterus.    Pupils: Pupils are equal, round, and reactive to light.  Neck:     Vascular: No JVD.  Cardiovascular:     Rate  and Rhythm: Normal rate and regular rhythm.     Heart sounds: S1 normal and S2 normal.  Pulmonary:     Effort: Pulmonary effort is normal.     Breath sounds: Wheezing and rhonchi present. No rales.  Abdominal:     General: Bowel sounds are normal. There is no distension.     Palpations: Abdomen is soft.     Tenderness: There is no abdominal tenderness. There is no guarding.  Musculoskeletal:     Cervical back: Neck supple.     Right lower leg: No edema.     Left lower leg: No edema.  Skin:    General: Skin is warm and dry.  Neurological:     General: No focal deficit present.     Mental Status: She is alert and oriented to person, place, and time.  Psychiatric:        Mood and Affect: Mood normal.        Behavior: Behavior normal. Behavior is cooperative.     Data Reviewed:  Results are pending, will review when available. August/2015 echocardiogram report. Study Conclusions:  - Left ventricle: The cavity size was normal. Systolic function was    normal. The estimated ejection fraction was in the range of 60%    to 65%. Wall motion was normal; there were no regional wall    motion abnormalities. There was an increased relative    contribution of atrial contraction  to ventricular filling.    Doppler parameters are consistent with abnormal left ventricular    relaxation (grade 1 diastolic dysfunction).  - Aortic valve: Mild thickening and calcification, consistent with    sclerosis.  - Mitral valve: Calcified annulus.   EKG: Vent. rate 67 BPM PR interval 192 ms QRS duration 81 ms QT/QTcB 422/446 ms P-R-T axes 64 -5 52 Sinus rhythm Probable left atrial enlargement Low voltage, precordial leads Abnormal inferior Q waves  Assessment and Plan: Principal Problem:   Acute respiratory failure with hypoxia (HCC) In the setting of:   Asthma exacerbation Secondary to recent episode of:   Influenza A  Admit to telemetry/inpatient. Continue supplemental oxygen as needed. Scheduled and as needed bronchodilators. Already finished oseltamivir course. Continue ceftriaxone 1 g IVPB daily. Continue azithromycin 500 mg IVPB daily. Check procalcitonin level today and tomorrow. Check strep pneumoniae urinary antigen. Check sputum Gram stain, culture and sensitivity. Follow-up blood culture and sensitivity. Follow-up CBC and chemistry in the morning.  Active Problems:   Lactic acidosis In the setting of hypoxia/use of beta agonist/hypotension/AKI. But also meeting sepsis criteria. Antibiotic coverage as above. IV fluid bolus. Repeat lactic acid level later this afternoon.    AKI (acute kidney injury) (HCC) Superimposed on:   CKD stage 3b, GFR 30-44 ml/min (HCC) Continue IV fluids. Avoid hypotension. Avoid nephrotoxins. Monitor intake and output. Monitor renal function electrolytes.    Dementia (HCC) Continue donezepil 10 mg p.o. bedtime. Continue memantine 5 mg p.o. twice daily.    GERD Continue daily famotidine. Given AKI, dose reduced to 10 mg daily.   Advance Care Planning:   Code Status: Full Code   Consults:   Family Communication:   Severity of Illness: The appropriate patient status for this patient is INPATIENT. Inpatient  status is judged to be reasonable and necessary in order to provide the required intensity of service to ensure the patient's safety. The patient's presenting symptoms, physical exam findings, and initial radiographic and laboratory data in the context of their chronic comorbidities is felt to place them  at high risk for further clinical deterioration. Furthermore, it is not anticipated that the patient will be medically stable for discharge from the hospital within 2 midnights of admission.   * I certify that at the point of admission it is my clinical judgment that the patient will require inpatient hospital care spanning beyond 2 midnights from the point of admission due to high intensity of service, high risk for further deterioration and high frequency of surveillance required.*  Author: Bobette Mo, MD 04/12/2023 2:46 PM  For on call review [email or web address].   This document was prepared using Dragon voice recognition software and may contain some unintended transcription errors.

## 2023-04-12 NOTE — ED Triage Notes (Signed)
PT BIB [facility] from [place]. Pt has been coughing with congestion and fever past 2 days. Was found in room at 85% RA then EMS called. EMS started duo neb and gave 125 solu med which brought her up to 99%. Pt has hx of asthma.  EMS noted pt to have wheezing and rhonchi.   BP 110/60 HR 60 99% on duoneb CBG 117 100.1

## 2023-04-12 NOTE — ED Provider Notes (Signed)
[place] EMERGENCY DEPARTMENT AT [facility] Provider Note   CSN: [id number] Arrival date & time: 04/12/23  1152     History {Add pertinent medical, surgical, social history, OB history to HPI:1} Chief Complaint  Patient presents with   Shortness of Breath    [name] is a 89 y.o. female.  Level 5 caveat secondary to dementia.  She is brought in by ambulance from [facility] for increased cough and shortness of breath.  EMS found her hypoxic with sats of 85%.  Gave her DuoNeb and 125 of Solu-Medrol.  Of note patient was here a little over a week ago and was positive for influenza treated with Tamiflu.  She herself denies any complaints.  She is not a reliable historian.  The history is provided by the patient and the EMS personnel.  Shortness of Breath Severity:  Unable to specify Onset quality:  Unable to specify Progression:  Improving Associated symptoms: cough and fever        Home Medications Prior to Admission medications   Medication Sig Start Date End Date Taking? Authorizing Provider  acetaminophen (TYLENOL) 650 MG CR tablet Take 650 mg by mouth every 8 (eight) hours as needed for pain.    [provider]  amLODipine (NORVASC) 10 MG tablet TAKE 1 TABLET BY MOUTH EVERY DAY Patient taking differently: Take 10 mg by mouth daily. 05/15/21   Donita Brooks, MD  donepezil (ARICEPT) 10 MG tablet TAKE 1 TABLET BY MOUTH EVERYDAY AT BEDTIME Patient taking differently: Take 10 mg by mouth at bedtime. 07/06/21   Donita Brooks, MD  levothyroxine (SYNTHROID) 75 MCG tablet Take 75 mcg by mouth daily before breakfast.    [provider]  memantine (NAMENDA) 10 MG tablet TAKE 1 TABLET BY MOUTH TWICE A DAY Patient taking differently: Take 10 mg by mouth 2 (two) times daily. 06/22/21   Donita Brooks, MD  metoprolol tartrate (LOPRESSOR) 25 MG tablet TAKE 1 TABLET BY MOUTH TWICE A DAY Patient taking differently: Take 25 mg by mouth 2 (two) times  daily. 06/15/21   Donita Brooks, MD  nystatin powder Apply 1 Application topically 3 (three) times daily. 04/03/22   Tilden Fossa, MD  oseltamivir (TAMIFLU) 30 MG capsule Take 1 capsule (30 mg total) by mouth daily. 04/03/22   Tilden Fossa, MD  pantoprazole (PROTONIX) 40 MG tablet TAKE 1 TABLET BY MOUTH EVERY DAY Patient taking differently: Take 40 mg by mouth daily. 06/15/21   Donita Brooks, MD      Allergies    Demerol [meperidine]    Review of Systems   Review of Systems  Unable to perform ROS: Dementia  Constitutional:  Positive for fever.  Respiratory:  Positive for cough and shortness of breath.     Physical Exam Updated Vital Signs BP 119/66   Pulse 71   Temp 98.5 F (36.9 C) (Oral)   Resp 18   Ht 5\' 2"  (1.575 m)   Wt 71.2 kg   SpO2 99%   BMI 28.72 kg/m  Physical Exam Vitals and nursing note reviewed.  Constitutional:      General: She is not in acute distress.    Appearance: She is well-developed.  HENT:     Head: Normocephalic and atraumatic.  Eyes:     Comments: Right pupil is 2 mm left pupil 5 mm.  Cardiovascular:     Rate and Rhythm: Normal rate and regular rhythm.     Heart sounds: No murmur  heard. Pulmonary:     Effort: Pulmonary effort is normal. No respiratory distress.     Breath sounds: Normal breath sounds.  Abdominal:     Palpations: Abdomen is soft.     Tenderness: There is no abdominal tenderness. There is no guarding or rebound.  Musculoskeletal:        General: No swelling. Normal range of motion.     Cervical back: Neck supple.     Right lower leg: No tenderness. No edema.     Left lower leg: No tenderness. No edema.  Skin:    General: Skin is warm and dry.     Capillary Refill: Capillary refill takes less than 2 seconds.  Neurological:     General: No focal deficit present.     Mental Status: She is alert. She is disoriented.     Comments: She is following commands and moving all extremities.  No gross focal deficits.      ED Results / Procedures / Treatments   Labs (all labs ordered are listed, but only abnormal results are displayed) Labs Reviewed - No data to display  EKG EKG Interpretation Date/Time:  Friday April 12 2023 12:00:21 EST Ventricular Rate:  67 PR Interval:  192 QRS Duration:  81 QT Interval:  422 QTC Calculation: 446 R Axis:   -5  Text Interpretation: Sinus rhythm Probable left atrial enlargement Low voltage, precordial leads Abnormal inferior Q waves Confirmed by Meridee Score [id number]) on 04/12/2023 12:01:57 PM  Radiology No results found.  Procedures Procedures  {Document cardiac monitor, telemetry assessment procedure when appropriate:1}  Medications Ordered in ED Medications - No data to display  ED Course/ Medical Decision Making/ A&P   {   Click here for ABCD2, HEART and other calculatorsREFRESH Note before signing :1}                              Medical Decision Making Amount and/or Complexity of Data Reviewed Labs: ordered. Radiology: ordered.   This patient complains of ***; this involves an extensive number of treatment Options and is a complaint that carries with it a high risk of complications and morbidity. The differential includes ***  I ordered, reviewed and interpreted labs, which included *** I ordered medication *** and reviewed PMP when indicated. I ordered imaging studies which included *** and I independently    visualized and interpreted imaging which showed *** Additional history obtained from *** Previous records obtained and reviewed *** I consulted *** and discussed lab and imaging findings and discussed disposition.  Cardiac monitoring reviewed, *** Social determinants considered, *** Critical Interventions: ***  After the interventions stated above, I reevaluated the patient and found *** Admission and further testing considered, ***   {Document critical care time when appropriate:1} {Document review of labs and clinical  decision tools ie heart score, Chads2Vasc2 etc:1}  {Document your independent review of radiology images, and any outside records:1} {Document your discussion with family members, caretakers, and with consultants:1} {Document social determinants of health affecting pt's care:1} {Document your decision making why or why not admission, treatments were needed:1} Final Clinical Impression(s) / ED Diagnoses Final diagnoses:  None    Rx / DC Orders ED Discharge Orders     None

## 2023-04-13 DIAGNOSIS — J189 Pneumonia, unspecified organism: Secondary | ICD-10-CM | POA: Diagnosis not present

## 2023-04-13 DIAGNOSIS — J101 Influenza due to other identified influenza virus with other respiratory manifestations: Secondary | ICD-10-CM

## 2023-04-13 DIAGNOSIS — J9601 Acute respiratory failure with hypoxia: Secondary | ICD-10-CM | POA: Diagnosis not present

## 2023-04-13 DIAGNOSIS — L8990 Pressure ulcer of unspecified site, unspecified stage: Secondary | ICD-10-CM | POA: Insufficient documentation

## 2023-04-13 LAB — CBC
HCT: 36.8 % (ref 36.0–46.0)
Hemoglobin: 11.6 g/dL — ABNORMAL LOW (ref 12.0–15.0)
MCH: 28.1 pg (ref 26.0–34.0)
MCHC: 31.5 g/dL (ref 30.0–36.0)
MCV: 89.1 fL (ref 80.0–100.0)
Platelets: 249 10*3/uL (ref 150–400)
RBC: 4.13 MIL/uL (ref 3.87–5.11)
RDW: 14.2 % (ref 11.5–15.5)
WBC: 11.9 10*3/uL — ABNORMAL HIGH (ref 4.0–10.5)
nRBC: 0 % (ref 0.0–0.2)

## 2023-04-13 LAB — COMPREHENSIVE METABOLIC PANEL
ALT: 18 U/L (ref 0–44)
AST: 19 U/L (ref 15–41)
Albumin: 3 g/dL — ABNORMAL LOW (ref 3.5–5.0)
Alkaline Phosphatase: 78 U/L (ref 38–126)
Anion gap: 12 (ref 5–15)
BUN: 33 mg/dL — ABNORMAL HIGH (ref 8–23)
CO2: 21 mmol/L — ABNORMAL LOW (ref 22–32)
Calcium: 8.5 mg/dL — ABNORMAL LOW (ref 8.9–10.3)
Chloride: 108 mmol/L (ref 98–111)
Creatinine, Ser: 1.42 mg/dL — ABNORMAL HIGH (ref 0.44–1.00)
GFR, Estimated: 36 mL/min — ABNORMAL LOW (ref >60)
Glucose, Bld: 117 mg/dL — ABNORMAL HIGH (ref 70–99)
Potassium: 4.1 mmol/L (ref 3.5–5.1)
Sodium: 141 mmol/L (ref 135–145)
Total Bilirubin: 0.5 mg/dL (ref 0.0–1.2)
Total Protein: 6.6 g/dL (ref 6.5–8.1)

## 2023-04-13 LAB — BLOOD CULTURE ID PANEL (REFLEXED) - BCID2

## 2023-04-13 LAB — LACTIC ACID, PLASMA
Lactic Acid, Venous: 2.7 mmol/L (ref 0.5–1.9)
Lactic Acid, Venous: 0.7 mmol/L (ref 0.5–1.9)

## 2023-04-13 LAB — PROCALCITONIN: Procalcitonin: 0.44 ng/mL

## 2023-04-13 LAB — STREP PNEUMONIAE URINARY ANTIGEN: Strep Pneumo Urinary Antigen: POSITIVE — AB

## 2023-04-13 LAB — MRSA NEXT GEN BY PCR, NASAL: MRSA by PCR Next Gen: NOT DETECTED

## 2023-04-13 MED ORDER — AMLODIPINE BESYLATE 10 MG PO TABS
10.0000 mg | ORAL_TABLET | Freq: Every day | ORAL | Status: DC
  Administered 2023-04-13 – 2023-04-17 (×5): 10 mg via ORAL
  Filled 2023-04-13 (×6): qty 1

## 2023-04-13 MED ORDER — METOPROLOL TARTRATE 25 MG PO TABS
25.0000 mg | ORAL_TABLET | Freq: Two times a day (BID) | ORAL | Status: DC
Start: 2023-04-13 — End: 2023-04-17
  Administered 2023-04-13 – 2023-04-17 (×9): 25 mg via ORAL
  Filled 2023-04-13 (×9): qty 1

## 2023-04-13 MED ORDER — SODIUM CHLORIDE 0.9 % IV BOLUS
500.0000 mL | INTRAVENOUS | Status: AC
  Administered 2023-04-13: 500 mL via INTRAVENOUS

## 2023-04-13 NOTE — Progress Notes (Signed)
PROGRESS NOTE                                                                                                                                                                                                             Patient Demographics:    [name], is a 89 y.o. female, DOB - 11-10-1932, AVW:[id number]  Outpatient Primary MD for the patient is Pcp, No    LOS - 1  Admit date - 04/12/2023    Chief Complaint  Patient presents with   Shortness of Breath       Brief Narrative (HPI from H&P)    [name] is a 89 y.o. female with medical history significant of stage 3b CKD, dementia, hyperlipidemia, hypertension, mild GERD, mitral valve prolapse, spinal stenosis, duodenal PUD, upper GI bleed with ABLA, elevated LFTs, food impaction of the esophagus, aspiration pneumonia, asthma who was diagnosed earlier this month with influenza and treated with Tamiflu at her SNF and was found hypoxic on morning of admission day prompting an EMS call. Patient was hypoxic with an O2 sat of 85% when they arrived and was placed on [facility].  Her O2 saturation improved with bronchodilators. She also received 125 mg of methylprednisolone. She was admitted for acute hypoxic respiratory failure secondary to pneumonia and asthma exacerbation.   Subjective:    [name] was evaluated at the bed side laying comfortably in bed. Continues to have intermittent dry cough. Patient reports that she feels better. Does not seem to be oriented but seems to be talking to herself, occasionally shaking her head and smiling. Spoke with patient's son on the phone who reports that this is patient's baseline.   Assessment  & Plan :    Assessment and Plan: # Acute hypoxic respiratory failure # Community-acquired pneumonia # Recent influenza A infection Patient presented after being hypoxic at her SNF in the setting of a recently treated influenza A. She  remains on 2 L [place]. Urinary strep pneumo positive, negative MRSA screen. Blood culture positive for staph species in 2 bottles from the same set which is likely a contaminant. She remains afebrile, leukocytosis trending down.  -Continue IV Rocephin and azithromycin -Start Mucinex DM twice daily -Continue supplemental O2, wean as able -Follow-up repeat procalcitonin -Trend CBC, fever curve  # Asthma exacerbation No significant wheezing on exam. Continues to  require supplemental oxygen. -Continue supplemental oxygen -Continue prednisone -Continue as needed albuterol nebs  # HTN BP elevated with SBP in the 160s to 170s. -Continue all amlodipine and metoprolol  # AKI on CKD 3B Baseline creatinine around 1.6-1.7. AKI resolved with creatinine improving from 2.27 on admission to 1.42. -Trend BMP  # Lactic acidosis, resolved Lactic acid peaked at 4.2, down to 0.7 this morning.  # Hypothyroidism -Continue Synthroid  # Dementia -Continue memantine and donepezil  # GERD -Continue famotidine  # Generalized debility -PT/OT eval  Nutrition Problem:        Estimated body mass index is 28.72 kg/m as calculated from the following:   Height as of this encounter: 5\' 2"  (1.575 m).   Weight as of this encounter: 71.2 kg.          Condition -improving  Family Communication  : Discussed plan with son over the phone  Code Status : Full code  Consults  : None  PUD Prophylaxis : Famotidine   Procedures  :     None      Disposition Plan  :    Status is: Inpatient Remains inpatient appropriate because: Acute hypoxic respiratory failure, pneumonia treatment  DVT Prophylaxis  :    heparin injection 5,000 Units Start: 04/12/23 2200     Lab Results  Component Value Date   PLT 249 04/13/2023    Diet :  Diet Order             Diet regular Fluid consistency: Thin  Diet effective now                    Inpatient Medications  Scheduled Meds:  amLODipine   10 mg Oral Daily   colestipol  1 g Oral Daily   donepezil  10 mg Oral QHS   famotidine  10 mg Oral Daily   heparin  5,000 Units Subcutaneous Q8H   levothyroxine  75 mcg Oral QAC breakfast   memantine  5 mg Oral BID   metoprolol tartrate  25 mg Oral BID   pantoprazole  40 mg Oral Daily   predniSONE  40 mg Oral Q breakfast   Continuous Infusions:  azithromycin 500 mg (04/12/23 1638)   cefTRIAXone (ROCEPHIN)  IV Stopped (04/12/23 1639)   PRN Meds:.acetaminophen **OR** acetaminophen, albuterol, ondansetron **OR** ondansetron (ZOFRAN) IV  Antibiotics  :    Anti-infectives (From admission, onward)    Start     Dose/Rate Route Frequency Ordered Stop   04/12/23 1600  cefTRIAXone (ROCEPHIN) 1 g in sodium chloride 0.9 % 100 mL IVPB        1 g 200 mL/hr over 30 Minutes Intravenous Every 24 hours 04/12/23 1534 04/17/23 1559   04/12/23 1600  azithromycin (ZITHROMAX) 500 mg in sodium chloride 0.9 % 250 mL IVPB        500 mg 250 mL/hr over 60 Minutes Intravenous Every 24 hours 04/12/23 1534 04/17/23 1559         Objective:   Vitals:   04/12/23 2147 04/13/23 0148 04/13/23 0603 04/13/23 1136  BP: (!) 143/74 (!) 160/91 (!) 187/83 (!) 175/91  Pulse: 72 78 73 (!) 58  Resp: 16 15 16 18   Temp: 98.1 F (36.7 C) (!) 97.5 F (36.4 C) 97.7 F (36.5 C) 98.8 F (37.1 C)  TempSrc: Oral Oral Oral   SpO2: 100% 99% 99%   Weight:      Height:        Wt Readings from  Last 3 Encounters:  04/12/23 71.2 kg  10/25/21 71.2 kg  09/15/21 73 kg     Intake/Output Summary (Last 24 hours) at 04/13/2023 1202 Last data filed at 04/13/2023 0602 Gross per 24 hour  Intake 1734.61 ml  Output 600 ml  Net 1134.61 ml     Physical Exam  General: Pleasant, chronically ill-appearing elderly woman laying in bed. No acute distress. HEENT: [place]/AT. Anicteric sclera CV: RRR. No murmurs, rubs, or gallops. No LE edema Pulmonary: On 3.5 L [place].  Lungs CTAB. Normal effort. No wheezing or rales. Abdominal: Soft,  NT/ND. Normal bowel sounds. Extremities: Palpable radial and DP pulses. Normal ROM. Skin: Warm and dry. No obvious rash or lesions. Neuro: Alert but not oriented. Moves all extremities. Normal sensation to light touch. No focal deficit. Psych: Normal mood and affect    RN pressure injury documentation: Pressure Injury 08/20/21 Buttocks Mid Stage II -  Partial thickness loss of dermis presenting as a shallow open ulcer with a red, pink wound bed without slough. (Active)  08/20/21 1948  Location: Buttocks  Location Orientation: Mid  Staging: Stage II -  Partial thickness loss of dermis presenting as a shallow open ulcer with a red, pink wound bed without slough.  Wound Description (Comments):   Present on Admission: Yes     Pressure Injury 08/20/21 Hip Anterior;Right Stage 2 -  Partial thickness loss of dermis presenting as a shallow open injury with a red, pink wound bed without slough. Long pressure wound to hip area (Active)  08/20/21 2159  Location: Hip  Location Orientation: Anterior;Right  Staging: Stage 2 -  Partial thickness loss of dermis presenting as a shallow open injury with a red, pink wound bed without slough.  Wound Description (Comments): Long pressure wound to hip area  Present on Admission: -- (Unknown)     Pressure Injury 04/12/23 Buttocks Right Stage 2 -  Partial thickness loss of dermis presenting as a shallow open injury with a red, pink wound bed without slough. (Active)  04/12/23 1729  Location: Buttocks  Location Orientation: Right  Staging: Stage 2 -  Partial thickness loss of dermis presenting as a shallow open injury with a red, pink wound bed without slough.  Wound Description (Comments):   Present on Admission: Yes  Dressing Type Foam - Lift dressing to assess site every shift 04/12/23 2146      Data Review:    Recent Labs  Lab 04/12/23 1232 04/13/23 0708  WBC 13.7* 11.9*  HGB 12.5 11.6*  HCT 40.2 36.8  PLT 294 249  MCV 90.7 89.1  MCH 28.2  28.1  MCHC 31.1 31.5  RDW 14.2 14.2  LYMPHSABS 2.6  --   MONOABS 1.4*  --   EOSABS 0.0  --   BASOSABS 0.0  --     Recent Labs  Lab 04/12/23 1232 04/12/23 1242 04/12/23 1427 04/12/23 1825 04/12/23 2312 04/13/23 0708  NA 138  --   --   --   --  141  K 4.0  --   --   --   --  4.1  CL 100  --   --   --   --  108  CO2 23  --   --   --   --  21*  ANIONGAP 15  --   --   --   --  12  GLUCOSE 128*  --   --   --   --  117*  BUN 41*  --   --   --   --  33*  CREATININE 2.27*  --   --   --   --  1.42*  AST 27  --   --   --   --  19  ALT 20  --   --   --   --  18  ALKPHOS 94  --   --   --   --  78  BILITOT 0.5  --   --   --   --  0.5  ALBUMIN 3.8  --   --   --   --  3.0*  PROCALCITON  --   --   --  0.43  --   --   LATICACIDVEN  --  2.0* 2.7* 4.2* 2.7* 0.7  INR 1.1  --   --   --   --   --   BNP 95.5  --   --   --   --   --   MG  --   --   --  2.1  --   --   CALCIUM 9.2  --   --   --   --  8.5*      Recent Labs  Lab 04/12/23 1232 04/12/23 1242 04/12/23 1427 04/12/23 1825 04/12/23 2312 04/13/23 0708  PROCALCITON  --   --   --  0.43  --   --   LATICACIDVEN  --  2.0* 2.7* 4.2* 2.7* 0.7  INR 1.1  --   --   --   --   --   BNP 95.5  --   --   --   --   --   MG  --   --   --  2.1  --   --   CALCIUM 9.2  --   --   --   --  8.5*    --------------------------------------------------------------------------------------------------------------- Lab Results  Component Value Date   CHOL 149 05/23/2020   HDL 65 05/23/2020   LDLCALC 66 05/23/2020   TRIG 95 05/23/2020   CHOLHDL 2.3 05/23/2020    Lab Results  Component Value Date   HGBA1C 5.6 09/04/2015   No results for input(s): "TSH", "T4TOTAL", "FREET4", "T3FREE", "THYROIDAB" in the last 72 hours. No results for input(s): "VITAMINB12", "FOLATE", "FERRITIN", "TIBC", "IRON", "RETICCTPCT" in the last 72  hours. ------------------------------------------------------------------------------------------------------------------ Cardiac Enzymes No results for input(s): "CKMB", "TROPONINI", "MYOGLOBIN" in the last 168 hours.  Invalid input(s): "CK"  Micro Results Recent Results (from the past 240 hours)  Resp panel by RT-PCR (RSV, Flu A&B, Covid)     Status: Abnormal   Collection Time: 04/12/23 12:14 PM   Specimen: Nasal Swab  Result Value Ref Range Status   SARS Coronavirus 2 by RT PCR NEGATIVE NEGATIVE Final    Comment: (NOTE) SARS-CoV-2 target nucleic acids are NOT DETECTED.  The SARS-CoV-2 RNA is generally detectable in upper respiratory specimens during the acute phase of infection. The lowest concentration of SARS-CoV-2 viral copies this assay can detect is 138 copies/mL. A negative result does not preclude SARS-Cov-2 infection and should not be used as the sole basis for treatment or other patient management decisions. A negative result may occur with  improper specimen collection/handling, submission of specimen other than nasopharyngeal swab, presence of viral mutation(s) within the areas targeted by this assay, and inadequate number of viral copies(<138 copies/mL). A negative result must be combined with clinical observations, patient history, and epidemiological information. The expected result is Negative.  Fact Sheet for Patients:  BloggerCourse.com  Fact Sheet for  Healthcare Providers:  SeriousBroker.it  This test is no t yet approved or cleared by the [place] and  has been authorized for detection and/or diagnosis of SARS-CoV-2 by FDA under an Emergency Use Authorization (EUA). This EUA will remain  in effect (meaning this test can be used) for the duration of the COVID-19 declaration under Section 564(b)(1) of the Act, 21 U.S.C.section 360bbb-3(b)(1), unless the authorization is terminated  or revoked  sooner.       Influenza A by PCR POSITIVE (A) NEGATIVE Final   Influenza B by PCR NEGATIVE NEGATIVE Final    Comment: (NOTE) The Xpert Xpress SARS-CoV-2/FLU/RSV plus assay is intended as an aid in the diagnosis of influenza from Nasopharyngeal swab specimens and should not be used as a sole basis for treatment. Nasal washings and aspirates are unacceptable for Xpert Xpress SARS-CoV-2/FLU/RSV testing.  Fact Sheet for Patients: BloggerCourse.com  Fact Sheet for Healthcare Providers: SeriousBroker.it  This test is not yet approved or cleared by the [place] FDA and has been authorized for detection and/or diagnosis of SARS-CoV-2 by FDA under an Emergency Use Authorization (EUA). This EUA will remain in effect (meaning this test can be used) for the duration of the COVID-19 declaration under Section 564(b)(1) of the Act, 21 U.S.C. section 360bbb-3(b)(1), unless the authorization is terminated or revoked.     Resp Syncytial Virus by PCR NEGATIVE NEGATIVE Final    Comment: (NOTE) Fact Sheet for Patients: BloggerCourse.com  Fact Sheet for Healthcare Providers: SeriousBroker.it  This test is not yet approved or cleared by the [place] FDA and has been authorized for detection and/or diagnosis of SARS-CoV-2 by FDA under an Emergency Use Authorization (EUA). This EUA will remain in effect (meaning this test can be used) for the duration of the COVID-19 declaration under Section 564(b)(1) of the Act, 21 U.S.C. section 360bbb-3(b)(1), unless the authorization is terminated or revoked.  Performed at [facility], [address], [place], [place] [address]   Blood Culture (routine x 2)     Status: None (Preliminary result)   Collection Time: 04/12/23 12:31 PM   Specimen: BLOOD  Result Value Ref Range Status   Specimen Description   Final    BLOOD RIGHT  ANTECUBITAL Performed at [facility], [address] [place], [place] [address]    Special Requests   Final    BOTTLES DRAWN AEROBIC AND ANAEROBIC Blood Culture adequate volume Performed at [facility], [address], [place], [place] [address]    Culture  Setup Time   Final    GRAM POSITIVE COCCI IN CLUSTERS IN BOTH AEROBIC AND ANAEROBIC BOTTLES CRITICAL RESULT CALLED TO, READ BACK BY AND VERIFIED WITH: PHARMD ABBY Weston Anna [phone] 1009 BY Berline Chough, [place] Performed at [facility], [address], [place], [place] [address]    Culture GRAM POSITIVE COCCI  Final   Report Status PENDING  Incomplete  Blood Culture ID Panel (Reflexed)     Status: Abnormal   Collection Time: 04/12/23 12:31 PM  Result Value Ref Range Status   Enterococcus faecalis NOT DETECTED NOT DETECTED Final   Enterococcus Faecium NOT DETECTED NOT DETECTED Final   Listeria monocytogenes NOT DETECTED NOT DETECTED Final   Staphylococcus species DETECTED (A) NOT DETECTED Final    Comment: CRITICAL RESULT CALLED TO, READ BACK BY AND VERIFIED WITH: PHARMD ABBY ELLINGTON [phone] 1009 BY J RAZZAK, MT    Staphylococcus aureus (BCID) NOT DETECTED NOT DETECTED Final  Staphylococcus epidermidis NOT DETECTED NOT DETECTED Final   Staphylococcus lugdunensis NOT DETECTED NOT DETECTED Final   Streptococcus species NOT DETECTED NOT DETECTED Final   Streptococcus agalactiae NOT DETECTED NOT DETECTED Final   Streptococcus pneumoniae NOT DETECTED NOT DETECTED Final   Streptococcus pyogenes NOT DETECTED NOT DETECTED Final   A.calcoaceticus-baumannii NOT DETECTED NOT DETECTED Final   Bacteroides fragilis NOT DETECTED NOT DETECTED Final   Enterobacterales NOT DETECTED NOT DETECTED Final   Enterobacter cloacae complex NOT DETECTED NOT DETECTED Final   Escherichia coli NOT DETECTED NOT DETECTED Final   Klebsiella aerogenes NOT DETECTED NOT DETECTED Final   Klebsiella oxytoca NOT DETECTED  NOT DETECTED Final   Klebsiella pneumoniae NOT DETECTED NOT DETECTED Final   Proteus species NOT DETECTED NOT DETECTED Final   Salmonella species NOT DETECTED NOT DETECTED Final   Serratia marcescens NOT DETECTED NOT DETECTED Final   Haemophilus influenzae NOT DETECTED NOT DETECTED Final   Neisseria meningitidis NOT DETECTED NOT DETECTED Final   Pseudomonas aeruginosa NOT DETECTED NOT DETECTED Final   Stenotrophomonas maltophilia NOT DETECTED NOT DETECTED Final   Candida albicans NOT DETECTED NOT DETECTED Final   Candida auris NOT DETECTED NOT DETECTED Final   Candida glabrata NOT DETECTED NOT DETECTED Final   Candida krusei NOT DETECTED NOT DETECTED Final   Candida parapsilosis NOT DETECTED NOT DETECTED Final   Candida tropicalis NOT DETECTED NOT DETECTED Final   Cryptococcus neoformans/gattii NOT DETECTED NOT DETECTED Final    Comment: Performed at [facility], [address] [place], [place] [address]  Blood Culture (routine x 2)     Status: None (Preliminary result)   Collection Time: 04/12/23  1:10 PM   Specimen: BLOOD RIGHT HAND  Result Value Ref Range Status   Specimen Description   Final    BLOOD RIGHT HAND BOTTLES DRAWN AEROBIC AND ANAEROBIC Performed at [facility], [address], [place], [place] [address]    Special Requests   Final    Blood Culture results may not be optimal due to an inadequate volume of blood received in culture bottles Performed at [facility], [address] [place], [place] [address]    Culture   Final    NO GROWTH < 24 HOURS Performed at [facility], [address] [place], [place] [address]    Report Status PENDING  Incomplete  MRSA Next Gen by PCR, Nasal     Status: None   Collection Time: 04/13/23  1:51 AM   Specimen: Nasal Mucosa; Nasal Swab  Result Value Ref Range Status   MRSA by PCR Next Gen NOT DETECTED NOT DETECTED Final    Comment: (NOTE) The GeneXpert MRSA Assay (FDA  approved for NASAL specimens only), is one component of a comprehensive MRSA colonization surveillance program. It is not intended to diagnose MRSA infection nor to guide or monitor treatment for MRSA infections. Test performance is not FDA approved in patients less than 36 years old. Performed at [facility], [address] [place], [place] [address]     Radiology Reports CT Chest Wo Contrast Result Date: 04/12/2023 CLINICAL DATA:  Pneumonia, complications suspected EXAM: CT CHEST WITHOUT CONTRAST TECHNIQUE: Multidetector CT imaging of the chest was performed following the standard protocol without IV contrast. RADIATION DOSE REDUCTION: This exam was performed according to the departmental dose-optimization program which includes automated exposure control, adjustment of the mA and/or kV according to patient size and/or use of iterative reconstruction technique. COMPARISON:  CT 02/10/2020 and previous FINDINGS: Cardiovascular: Heart size normal. No pericardial effusion. Mitral annulus calcifications. 3-vessel coronary calcifications. Aortic Atherosclerosis (ICD10-170.0). Mediastinum/Nodes: No mass or adenopathy. Lungs/Pleura: No pleural effusion. No pneumothorax. Subpleural subcentimeter calcified granuloma in the left lung apex. Subsegmental atelectasis or scarring extending from the hilum to the pleural surface in the lateral basal segment right lower lobe. There is some linear subsegmental atelectasis in both lung bases. Fluid/debris in lateral lobar airways in the right lower lobe. Upper Abdomen: Layering hyperdense material in the nondilated gallbladder. 7.7 cm 13 HU exophytic left renal lesion cyst incompletely visualized, better characterized on prior CT; no follow-up recommended. Musculoskeletal: Thoracolumbar dextroscoliosis apex L1 with vertebral endplate spurring at multiple levels in the mid and lower thoracic spine. IMPRESSION: 1. No acute findings. 2. Subsegmental  atelectasis or scarring lateral basal segment right lower lobe. 3. Coronary and   Aortic Atherosclerosis (ICD10-I70.0). Electronically Signed   By: Corlis Leak M.D.   On: 04/12/2023 16:07   DG Chest Port 1 View Result Date: 04/12/2023 CLINICAL DATA:  Questionable sepsis EXAM: PORTABLE CHEST 1 VIEW COMPARISON:  04/02/2022 FINDINGS: Patient rotated to the right. Wire, lead, and clothing artifact projects over the chest. A presumably artifactual density projects over the medial right lower hemithorax. Normal heart size. Tortuous thoracic aorta. No pleural effusion or pneumothorax. Clear lungs. IMPRESSION: Artifact and position degraded exam. Given this limitation, no evidence of pneumonia. Electronically Signed   By: Jeronimo Greaves M.D.   On: 04/12/2023 13:47      Signature  -   Steffanie Rainwater M.D on 04/13/2023 at 12:02 PM   -  To page go to [email or web address]

## 2023-04-13 NOTE — Progress Notes (Signed)
PHARMACY - PHYSICIAN COMMUNICATION CRITICAL VALUE ALERT - BLOOD CULTURE IDENTIFICATION (BCID)  [name] is an 89 y.o. female who presented to [facility] on 04/12/2023 with a chief complaint of shortness of breath  Assessment:   1/24 Bcx 2/4 (1 set) staph species, suspect contamination given same set Recently treated for flu outpatient On ceftriaxone/azithromycin for pneumonia Strep pneumo urinary antigen positive  Name of physician (or Provider) Contacted: Dr. Kirke Corin  Current antibiotics: ceftriaxone/azithromycin  Changes to prescribed antibiotics recommended: none  Results for orders placed or performed during the hospital encounter of 04/12/23  Blood Culture ID Panel (Reflexed) (Collected: 04/12/2023 12:31 PM)  Result Value Ref Range   Enterococcus faecalis NOT DETECTED NOT DETECTED   Enterococcus Faecium NOT DETECTED NOT DETECTED   Listeria monocytogenes NOT DETECTED NOT DETECTED   Staphylococcus species DETECTED (A) NOT DETECTED   Staphylococcus aureus (BCID) NOT DETECTED NOT DETECTED   Staphylococcus epidermidis NOT DETECTED NOT DETECTED   Staphylococcus lugdunensis NOT DETECTED NOT DETECTED   Streptococcus species NOT DETECTED NOT DETECTED   Streptococcus agalactiae NOT DETECTED NOT DETECTED   Streptococcus pneumoniae NOT DETECTED NOT DETECTED   Streptococcus pyogenes NOT DETECTED NOT DETECTED   A.calcoaceticus-baumannii NOT DETECTED NOT DETECTED   Bacteroides fragilis NOT DETECTED NOT DETECTED   Enterobacterales NOT DETECTED NOT DETECTED   Enterobacter cloacae complex NOT DETECTED NOT DETECTED   Escherichia coli NOT DETECTED NOT DETECTED   Klebsiella aerogenes NOT DETECTED NOT DETECTED   Klebsiella oxytoca NOT DETECTED NOT DETECTED   Klebsiella pneumoniae NOT DETECTED NOT DETECTED   Proteus species NOT DETECTED NOT DETECTED   Salmonella species NOT DETECTED NOT DETECTED   Serratia marcescens NOT DETECTED NOT DETECTED   Haemophilus influenzae NOT DETECTED NOT  DETECTED   Neisseria meningitidis NOT DETECTED NOT DETECTED   Pseudomonas aeruginosa NOT DETECTED NOT DETECTED   Stenotrophomonas maltophilia NOT DETECTED NOT DETECTED   Candida albicans NOT DETECTED NOT DETECTED   Candida auris NOT DETECTED NOT DETECTED   Candida glabrata NOT DETECTED NOT DETECTED   Candida krusei NOT DETECTED NOT DETECTED   Candida parapsilosis NOT DETECTED NOT DETECTED   Candida tropicalis NOT DETECTED NOT DETECTED   Cryptococcus neoformans/gattii NOT DETECTED NOT DETECTED    Pricilla Riffle, PharmD, BCPS Clinical Pharmacist 04/13/2023 10:26 AM

## 2023-04-13 NOTE — Progress Notes (Signed)
Date and time results received: 04/12/2023 2000 pm  (use smartphrase ".now" to insert current time)  Test: Blood test. Critical Value: Lactic Acid 4.2  Name of Provider Notified: Anthoney Harada, NP  Orders Received? Or Actions Taken?:  New orders to give 500 cc NS IV bolus.

## 2023-04-13 NOTE — Plan of Care (Signed)
  Problem: Fluid Volume: Goal: Hemodynamic stability will improve Outcome: Progressing   Problem: Clinical Measurements: Goal: Diagnostic test results will improve Outcome: Progressing Goal: Signs and symptoms of infection will decrease Outcome: Progressing   Problem: Respiratory: Goal: Ability to maintain adequate ventilation will improve Outcome: Progressing   Problem: Clinical Measurements: Goal: Ability to maintain clinical measurements within normal limits will improve Outcome: Progressing Goal: Will remain free from infection Outcome: Progressing Goal: Respiratory complications will improve Outcome: Progressing Goal: Cardiovascular complication will be avoided Outcome: Progressing   Problem: Nutrition: Goal: Adequate nutrition will be maintained Outcome: Progressing   Problem: Coping: Goal: Level of anxiety will decrease Outcome: Progressing   Problem: Elimination: Goal: Will not experience complications related to bowel motility Outcome: Progressing   Problem: Pain Managment: Goal: General experience of comfort will improve and/or be controlled Outcome: Progressing   Problem: Safety: Goal: Ability to remain free from injury will improve Outcome: Progressing   Problem: Skin Integrity: Goal: Risk for impaired skin integrity will decrease Outcome: Progressing   Problem: Respiratory: Goal: Ability to maintain a clear airway will improve Outcome: Progressing Goal: Levels of oxygenation will improve Outcome: Progressing Goal: Ability to maintain adequate ventilation will improve Outcome: Progressing   Problem: Activity: Goal: Ability to tolerate increased activity will improve Outcome: Progressing   Problem: Respiratory: Goal: Ability to maintain adequate ventilation will improve Outcome: Progressing Goal: Ability to maintain a clear airway will improve Outcome: Progressing

## 2023-04-13 NOTE — Plan of Care (Signed)
  Problem: Clinical Measurements: Goal: Signs and symptoms of infection will decrease Outcome: Progressing   Problem: Respiratory: Goal: Ability to maintain adequate ventilation will improve Outcome: Progressing   Problem: Elimination: Goal: Will not experience complications related to urinary retention Outcome: Progressing

## 2023-04-14 ENCOUNTER — Other Ambulatory Visit: Payer: Self-pay

## 2023-04-14 DIAGNOSIS — J9601 Acute respiratory failure with hypoxia: Secondary | ICD-10-CM | POA: Diagnosis not present

## 2023-04-14 LAB — CBC
HCT: 39.3 % (ref 36.0–46.0)
Hemoglobin: 12.1 g/dL (ref 12.0–15.0)
MCH: 28.2 pg (ref 26.0–34.0)
MCHC: 30.8 g/dL (ref 30.0–36.0)
MCV: 91.6 fL (ref 80.0–100.0)
Platelets: 306 10*3/uL (ref 150–400)
RBC: 4.29 MIL/uL (ref 3.87–5.11)
RDW: 14.3 % (ref 11.5–15.5)
WBC: 13.8 10*3/uL — ABNORMAL HIGH (ref 4.0–10.5)
nRBC: 0 % (ref 0.0–0.2)

## 2023-04-14 LAB — BASIC METABOLIC PANEL
Anion gap: 9 (ref 5–15)
BUN: 37 mg/dL — ABNORMAL HIGH (ref 8–23)
CO2: 25 mmol/L (ref 22–32)
Calcium: 9.2 mg/dL (ref 8.9–10.3)
Chloride: 110 mmol/L (ref 98–111)
Creatinine, Ser: 1.26 mg/dL — ABNORMAL HIGH (ref 0.44–1.00)
GFR, Estimated: 41 mL/min — ABNORMAL LOW (ref >60)
Glucose, Bld: 102 mg/dL — ABNORMAL HIGH (ref 70–99)
Potassium: 4.6 mmol/L (ref 3.5–5.1)
Sodium: 144 mmol/L (ref 135–145)

## 2023-04-14 NOTE — Plan of Care (Signed)
  Problem: Safety: Goal: Ability to remain free from injury will improve Outcome: Progressing   Problem: Skin Integrity: Goal: Risk for impaired skin integrity will decrease Outcome: Progressing   Problem: Nutrition: Goal: Adequate nutrition will be maintained Outcome: Not Progressing   Problem: Activity: Goal: Ability to tolerate increased activity will improve Outcome: Not Progressing

## 2023-04-14 NOTE — Evaluation (Signed)
Physical Therapy Evaluation Patient Details Name: [name] MRN: [id number] DOB: Jan 24, 1933 Today's Date: 04/14/2023  History of Present Illness  89 yo female admitted with acute respiratory failure, Pna. Recent flu and scabies treatment. Hx of dementia, MVP, spinal stenosis, staph infection,ckd  Clinical Impression  On eval, pt required Mod A for bed mobility and standing. Pt followed commands inconsistently.Will likely require +2 assist for ambulation. No family present during session. Pt is weak and at risk for falls when mobilizing. Not completely sure of baseline mobility status. Will plan to follow and progress activity as safely able.  Patient will benefit from continued inpatient follow up therapy, <3 hours/day, unless ALF is able to provide current level of care.       If plan is discharge home, recommend the following: A lot of help with walking and/or transfers;A lot of help with bathing/dressing/bathroom;Assistance with feeding   Can travel by private vehicle        Equipment Recommendations    Recommendations for Other Services       Functional Status Assessment Patient has had a recent decline in their functional status and demonstrates the ability to make significant improvements in function in a reasonable and predictable amount of time.     Precautions / Restrictions Precautions Precautions: Fall Restrictions Weight Bearing Restrictions Per Provider Order: No      Mobility  Bed Mobility Overal bed mobility: Needs Assistance Bed Mobility: Supine to Sit, Sit to Supine     Supine to sit: Mod assist Sit to supine: Min assist   General bed mobility comments: Increased time and multimodal cueing required. Assist for trunk and LEs. Assist to position at EOB.    Transfers Overall transfer level: Needs assistance Equipment used: Rolling walker (2 wheels) Transfers: Sit to/from Stand Sit to Stand: Mod assist           General transfer comment: x2  with RW. Assist to power up, steady, manage RW, control descent. Able to get her to take a few side steps along bedside with RW. High fall risk.    Ambulation/Gait                  Stairs            Wheelchair Mobility     Tilt Bed    Modified Rankin (Stroke Patients Only)       Balance Overall balance assessment: Needs assistance         Standing balance support: Bilateral upper extremity supported, Reliant on assistive device for balance Standing balance-Leahy Scale: Poor                               Pertinent Vitals/Pain Pain Assessment Pain Assessment: Faces Faces Pain Scale: No hurt    Home Living Family/patient expects to be discharged to:: Assisted living                        Prior Function Prior Level of Function : Patient poor historian/Family not available             Mobility Comments: per chart review, ambulatory at ALF (may need to confirm with family/facility) ADLs Comments: requires assist     Extremity/Trunk Assessment   Upper Extremity Assessment Upper Extremity Assessment: Defer to OT evaluation    Lower Extremity Assessment Lower Extremity Assessment: Generalized weakness    Cervical / Trunk Assessment Cervical / Trunk  Assessment: Kyphotic  Communication   Communication Communication: Hearing impairment Cueing Techniques: Verbal cues;Gestural cues;Tactile cues  Cognition Arousal: Alert Behavior During Therapy: WFL for tasks assessed/performed Overall Cognitive Status: History of cognitive impairments - at baseline                                          General Comments      Exercises     Assessment/Plan    PT Assessment Patient needs continued PT services  PT Problem List Decreased strength;Decreased activity tolerance;Decreased balance;Decreased mobility;Pain;Decreased cognition;Decreased safety awareness;Decreased knowledge of use of DME       PT Treatment  Interventions DME instruction;Gait training;Functional mobility training;Therapeutic activities;Therapeutic exercise;Patient/family education;Balance training    PT Goals (Current goals can be found in the Care Plan section)  Acute Rehab PT Goals PT Goal Formulation: Patient unable to participate in goal setting Time For Goal Achievement: 04/28/23 Potential to Achieve Goals: Fair    Frequency Min 1X/week     Co-evaluation               AM-PAC PT "6 Clicks" Mobility  Outcome Measure Help needed turning from your back to your side while in a flat bed without using bedrails?: A Lot Help needed moving from lying on your back to sitting on the side of a flat bed without using bedrails?: A Lot Help needed moving to and from a bed to a chair (including a wheelchair)?: A Lot Help needed standing up from a chair using your arms (e.g., wheelchair or bedside chair)?: A Lot Help needed to walk in hospital room?: Total Help needed climbing 3-5 steps with a railing? : Total 6 Click Score: 10    End of Session Equipment Utilized During Treatment: Gait belt Activity Tolerance: Patient tolerated treatment well Patient left: in bed;with call bell/phone within reach;with bed alarm set (with mittens on)        Time: 4098-1191 PT Time Calculation (min) (ACUTE ONLY): 24 min   Charges:   PT Evaluation $PT Eval Low Complexity: 1 Low PT Treatments $Therapeutic Activity: 8-22 mins PT General Charges $$ ACUTE PT VISIT: 1 Visit            Faye Ramsay, PT Acute Rehabilitation  Office: [phone]

## 2023-04-14 NOTE — Progress Notes (Signed)
PROGRESS NOTE                                                                                                                                                                                                             Patient Demographics:    [name], is a 89 y.o. female, DOB - 09-12-1932, ZOX:[id number]  Outpatient Primary MD for the patient is Pcp, No    LOS - 2  Admit date - 04/12/2023    Chief Complaint  Patient presents with   Shortness of Breath       Brief Narrative (HPI from H&P)    [name] is a 89 y.o. female with medical history significant of stage 3b CKD, dementia, hyperlipidemia, hypertension, mild GERD, mitral valve prolapse, spinal stenosis, duodenal PUD, upper GI bleed with ABLA, elevated LFTs, food impaction of the esophagus, aspiration pneumonia, asthma who was diagnosed earlier this month with influenza and treated with Tamiflu at her SNF and was found hypoxic on morning of admission day prompting an EMS call. Patient was hypoxic with an O2 sat of 85% when they arrived and was placed on [facility].  Her O2 saturation improved with bronchodilators. She also received 125 mg of methylprednisolone. She was admitted for acute hypoxic respiratory failure secondary to pneumonia and asthma exacerbation.  On 1/26 the patient was ssaturating 96% on 2L. Continue to wean prednisone and O2.   Subjective:    [name] was resting comfortably in bed, No new complaints. She states that her breathing is still difficult. She continues to have a coarse productive cough.   Assessment  & Plan :    Assessment and Plan: Acute hypoxic respiratory failure Community-acquired pneumonia Recent influenza A infection Patient presented after being hypoxic at her SNF in the setting of a recently treated influenza A. She remains on 2 L [place]. Urinary strep pneumo positive, negative MRSA screen. Blood culture positive for  staph species in 2 bottles from the same set which is likely a contaminant. She remains afebrile, leukocytosis trending down.  -Continue IV Rocephin and azithromycin -Start Mucinex DM twice daily -Continue supplemental O2, wean as able -Follow-up repeat procalcitonin -Trend CBC, fever curve  Asthma exacerbation No significant wheezing on exam. Continues to require supplemental oxygen. -Wean oxygen as possible -Wean prednisone as possible -Continue as needed albuterol nebs  HTN BP  elevated with SBP in the 160s to 170s. -Continue all amlodipine and metoprolol  AKI on CKD 3B Baseline creatinine around 1.6-1.7. AKI resolved with creatinine improving from 2.27 on admission to 1.42. -Trend BMP  Lactic acidosis, resolved Lactic acid peaked at 4.2, down to 0.7 this morning.  Hypothyroidism -Continue Synthroid  Dementia -Continue memantine and donepezil  GERD -Continue famotidine  Generalized debility -PT/OT eval        Estimated body mass index is 28.72 kg/m as calculated from the following:   Height as of this encounter: 5\' 2"  (1.575 m).   Weight as of this encounter: 71.2 kg.          Condition -improving slowly  Family Communication  : Discussed plan with son over the phone  Code Status : Full code  Consults  : None  PUD Prophylaxis : Famotidine   Procedures  :     None      Disposition Plan  :  tbd  Status is: Inpatient Remains inpatient appropriate because: Acute hypoxic respiratory failure, pneumonia treatment  DVT Prophylaxis  :    heparin injection 5,000 Units Start: 04/12/23 2200  Lab Results  Component Value Date   PLT 306 04/14/2023   Diet :  Diet Order             Diet regular Fluid consistency: Thin  Diet effective now                    Inpatient Medications  Scheduled Meds:  amLODipine  10 mg Oral Daily   colestipol  1 g Oral Daily   donepezil  10 mg Oral QHS   famotidine  10 mg Oral Daily   heparin  5,000 Units  Subcutaneous Q8H   levothyroxine  75 mcg Oral QAC breakfast   memantine  5 mg Oral BID   metoprolol tartrate  25 mg Oral BID   pantoprazole  40 mg Oral Daily   predniSONE  40 mg Oral Q breakfast   Continuous Infusions:  azithromycin 500 mg (04/14/23 1600)   cefTRIAXone (ROCEPHIN)  IV 1 g (04/14/23 1727)   PRN Meds:.acetaminophen **OR** acetaminophen, albuterol, ondansetron **OR** ondansetron (ZOFRAN) IV  Antibiotics  :    Anti-infectives (From admission, onward)    Start     Dose/Rate Route Frequency Ordered Stop   04/12/23 1600  cefTRIAXone (ROCEPHIN) 1 g in sodium chloride 0.9 % 100 mL IVPB        1 g 200 mL/hr over 30 Minutes Intravenous Every 24 hours 04/12/23 1534 04/17/23 1559   04/12/23 1600  azithromycin (ZITHROMAX) 500 mg in sodium chloride 0.9 % 250 mL IVPB        500 mg 250 mL/hr over 60 Minutes Intravenous Every 24 hours 04/12/23 1534 04/17/23 1559         Objective:   Vitals:   04/13/23 1322 04/13/23 2011 04/14/23 0653 04/14/23 1328  BP: (!) 156/74 (!) 131/98 (!) 175/82 (!) 162/81  Pulse:  78 62   Resp: 16 18 18 18   Temp:  98.2 F (36.8 C) 97.8 F (36.6 C) (!) 97 F (36.1 C)  TempSrc:    Axillary  SpO2: 99% 100% 97% 100%  Weight:      Height:        Wt Readings from Last 3 Encounters:  04/12/23 71.2 kg  10/25/21 71.2 kg  09/15/21 73 kg     Intake/Output Summary (Last 24 hours) at 04/14/2023 1916 Last data filed at 04/14/2023  1845 Gross per 24 hour  Intake 360 ml  Output 650 ml  Net -290 ml   Exam:  Constitutional:  The patient is awake, alert, and oriented x 3. She continues to be dyspneic with conversation. Respiratory:  Positive for increased work of breathing especially with conversation No rales, or rhonchi Positive for coarse coughs and wheezes throughout. No tactile fremitus Cardiovascular:  Regular rate and rhythm No murmurs, ectopy, or gallups. No lateral PMI. No thrills. Abdomen:  Abdomen is soft, non-tender,  non-distended No hernias, masses, or organomegaly Normoactive bowel sounds.  Musculoskeletal:  No cyanosis, clubbing, or edema Skin:  No rashes, lesions, ulcers palpation of skin: no induration or nodules Neurologic:  CN 2-12 intact Sensation all 4 extremities intact   RN pressure injury documentation: Pressure Injury 08/20/21 Buttocks Mid Stage II -  Partial thickness loss of dermis presenting as a shallow open ulcer with a red, pink wound bed without slough. (Active)  08/20/21 1948  Location: Buttocks  Location Orientation: Mid  Staging: Stage II -  Partial thickness loss of dermis presenting as a shallow open ulcer with a red, pink wound bed without slough.  Wound Description (Comments):   Present on Admission: Yes     Pressure Injury 08/20/21 Hip Anterior;Right Stage 2 -  Partial thickness loss of dermis presenting as a shallow open injury with a red, pink wound bed without slough. Long pressure wound to hip area (Active)  08/20/21 2159  Location: Hip  Location Orientation: Anterior;Right  Staging: Stage 2 -  Partial thickness loss of dermis presenting as a shallow open injury with a red, pink wound bed without slough.  Wound Description (Comments): Long pressure wound to hip area  Present on Admission: -- (Unknown)     Pressure Injury 04/12/23 Buttocks Right Stage 2 -  Partial thickness loss of dermis presenting as a shallow open injury with a red, pink wound bed without slough. (Active)  04/12/23 1729  Location: Buttocks  Location Orientation: Right  Staging: Stage 2 -  Partial thickness loss of dermis presenting as a shallow open injury with a red, pink wound bed without slough.  Wound Description (Comments):   Present on Admission: Yes  Dressing Type Foam - Lift dressing to assess site every shift 04/13/23 2145      Data Review:    Recent Labs  Lab 04/12/23 1232 04/13/23 0708 04/14/23 0612  WBC 13.7* 11.9* 13.8*  HGB 12.5 11.6* 12.1  HCT 40.2 36.8 39.3  PLT  294 249 306  MCV 90.7 89.1 91.6  MCH 28.2 28.1 28.2  MCHC 31.1 31.5 30.8  RDW 14.2 14.2 14.3  LYMPHSABS 2.6  --   --   MONOABS 1.4*  --   --   EOSABS 0.0  --   --   BASOSABS 0.0  --   --     Recent Labs  Lab 04/12/23 1232 04/12/23 1242 04/12/23 1427 04/12/23 1825 04/12/23 2312 04/13/23 0708 04/14/23 0612  NA 138  --   --   --   --  141 144  K 4.0  --   --   --   --  4.1 4.6  CL 100  --   --   --   --  108 110  CO2 23  --   --   --   --  21* 25  ANIONGAP 15  --   --   --   --  12 9  GLUCOSE 128*  --   --   --   --  117* 102*  BUN 41*  --   --   --   --  33* 37*  CREATININE 2.27*  --   --   --   --  1.42* 1.26*  AST 27  --   --   --   --  19  --   ALT 20  --   --   --   --  18  --   ALKPHOS 94  --   --   --   --  78  --   BILITOT 0.5  --   --   --   --  0.5  --   ALBUMIN 3.8  --   --   --   --  3.0*  --   PROCALCITON  --   --   --  0.43  --  0.44  --   LATICACIDVEN  --  2.0* 2.7* 4.2* 2.7* 0.7  --   INR 1.1  --   --   --   --   --   --   BNP 95.5  --   --   --   --   --   --   MG  --   --   --  2.1  --   --   --   CALCIUM 9.2  --   --   --   --  8.5* 9.2      Recent Labs  Lab 04/12/23 1232 04/12/23 1242 04/12/23 1427 04/12/23 1825 04/12/23 2312 04/13/23 0708 04/14/23 0612  PROCALCITON  --   --   --  0.43  --  0.44  --   LATICACIDVEN  --  2.0* 2.7* 4.2* 2.7* 0.7  --   INR 1.1  --   --   --   --   --   --   BNP 95.5  --   --   --   --   --   --   MG  --   --   --  2.1  --   --   --   CALCIUM 9.2  --   --   --   --  8.5* 9.2    --------------------------------------------------------------------------------------------------------------- Lab Results  Component Value Date   CHOL 149 05/23/2020   HDL 65 05/23/2020   LDLCALC 66 05/23/2020   TRIG 95 05/23/2020   CHOLHDL 2.3 05/23/2020    Lab Results  Component Value Date   HGBA1C 5.6 09/04/2015   No results for input(s): "TSH", "T4TOTAL", "FREET4", "T3FREE", "THYROIDAB" in the last 72 hours. No results  for input(s): "VITAMINB12", "FOLATE", "FERRITIN", "TIBC", "IRON", "RETICCTPCT" in the last 72 hours. ------------------------------------------------------------------------------------------------------------------ Cardiac Enzymes No results for input(s): "CKMB", "TROPONINI", "MYOGLOBIN" in the last 168 hours.  Invalid input(s): "CK"  Micro Results Recent Results (from the past 240 hours)  Resp panel by RT-PCR (RSV, Flu A&B, Covid)     Status: Abnormal   Collection Time: 04/12/23 12:14 PM   Specimen: Nasal Swab  Result Value Ref Range Status   SARS Coronavirus 2 by RT PCR NEGATIVE NEGATIVE Final    Comment: (NOTE) SARS-CoV-2 target nucleic acids are NOT DETECTED.  The SARS-CoV-2 RNA is generally detectable in upper respiratory specimens during the acute phase of infection. The lowest concentration of SARS-CoV-2 viral copies this assay can detect is 138 copies/mL. A negative result does not preclude SARS-Cov-2 infection and should not be used as the sole basis for treatment or other patient management decisions. A  negative result may occur with  improper specimen collection/handling, submission of specimen other than nasopharyngeal swab, presence of viral mutation(s) within the areas targeted by this assay, and inadequate number of viral copies(<138 copies/mL). A negative result must be combined with clinical observations, patient history, and epidemiological information. The expected result is Negative.  Fact Sheet for Patients:  BloggerCourse.com  Fact Sheet for Healthcare Providers:  SeriousBroker.it  This test is no t yet approved or cleared by the [place] FDA and  has been authorized for detection and/or diagnosis of SARS-CoV-2 by FDA under an Emergency Use Authorization (EUA). This EUA will remain  in effect (meaning this test can be used) for the duration of the COVID-19 declaration under Section 564(b)(1) of the  Act, 21 U.S.C.section 360bbb-3(b)(1), unless the authorization is terminated  or revoked sooner.       Influenza A by PCR POSITIVE (A) NEGATIVE Final   Influenza B by PCR NEGATIVE NEGATIVE Final    Comment: (NOTE) The Xpert Xpress SARS-CoV-2/FLU/RSV plus assay is intended as an aid in the diagnosis of influenza from Nasopharyngeal swab specimens and should not be used as a sole basis for treatment. Nasal washings and aspirates are unacceptable for Xpert Xpress SARS-CoV-2/FLU/RSV testing.  Fact Sheet for Patients: BloggerCourse.com  Fact Sheet for Healthcare Providers: SeriousBroker.it  This test is not yet approved or cleared by the [place] FDA and has been authorized for detection and/or diagnosis of SARS-CoV-2 by FDA under an Emergency Use Authorization (EUA). This EUA will remain in effect (meaning this test can be used) for the duration of the COVID-19 declaration under Section 564(b)(1) of the Act, 21 U.S.C. section 360bbb-3(b)(1), unless the authorization is terminated or revoked.     Resp Syncytial Virus by PCR NEGATIVE NEGATIVE Final    Comment: (NOTE) Fact Sheet for Patients: BloggerCourse.com  Fact Sheet for Healthcare Providers: SeriousBroker.it  This test is not yet approved or cleared by the [place] FDA and has been authorized for detection and/or diagnosis of SARS-CoV-2 by FDA under an Emergency Use Authorization (EUA). This EUA will remain in effect (meaning this test can be used) for the duration of the COVID-19 declaration under Section 564(b)(1) of the Act, 21 U.S.C. section 360bbb-3(b)(1), unless the authorization is terminated or revoked.  Performed at [facility], [address], [place], [place] [address]   Blood Culture (routine x 2)     Status: Abnormal (Preliminary result)   Collection Time: 04/12/23 12:31 PM    Specimen: BLOOD  Result Value Ref Range Status   Specimen Description   Final    BLOOD RIGHT ANTECUBITAL Performed at [facility], [address] [place], [place] [address]    Special Requests   Final    BOTTLES DRAWN AEROBIC AND ANAEROBIC Blood Culture adequate volume Performed at [facility], [address], [place], [place] [address]    Culture  Setup Time   Final    GRAM POSITIVE COCCI IN CLUSTERS IN BOTH AEROBIC AND ANAEROBIC BOTTLES CRITICAL RESULT CALLED TO, READ BACK BY AND VERIFIED WITH: PHARMD ABBY ELLINGTON [phone] [id number] BY J RAZZAK, MT    Culture (A)  Final    STAPHYLOCOCCUS HOMINIS THE SIGNIFICANCE OF ISOLATING THIS ORGANISM FROM A SINGLE SET OF BLOOD CULTURES WHEN MULTIPLE SETS ARE DRAWN IS UNCERTAIN. PLEASE NOTIFY THE MICROBIOLOGY DEPARTMENT WITHIN ONE WEEK IF SPECIATION AND SENSITIVITIES ARE REQUIRED. Performed at [facility], [address] [place], [place] [address]  Report Status PENDING  Incomplete  Blood Culture ID Panel (Reflexed)     Status: Abnormal   Collection Time: 04/12/23 12:31 PM  Result Value Ref Range Status   Enterococcus faecalis NOT DETECTED NOT DETECTED Final   Enterococcus Faecium NOT DETECTED NOT DETECTED Final   Listeria monocytogenes NOT DETECTED NOT DETECTED Final   Staphylococcus species DETECTED (A) NOT DETECTED Final    Comment: CRITICAL RESULT CALLED TO, READ BACK BY AND VERIFIED WITH: PHARMD ABBY ELLINGTON [phone] [id number] BY J RAZZAK, MT    Staphylococcus aureus (BCID) NOT DETECTED NOT DETECTED Final   Staphylococcus epidermidis NOT DETECTED NOT DETECTED Final   Staphylococcus lugdunensis NOT DETECTED NOT DETECTED Final   Streptococcus species NOT DETECTED NOT DETECTED Final   Streptococcus agalactiae NOT DETECTED NOT DETECTED Final   Streptococcus pneumoniae NOT DETECTED NOT DETECTED Final   Streptococcus pyogenes NOT DETECTED NOT DETECTED Final   A.calcoaceticus-baumannii NOT  DETECTED NOT DETECTED Final   Bacteroides fragilis NOT DETECTED NOT DETECTED Final   Enterobacterales NOT DETECTED NOT DETECTED Final   Enterobacter cloacae complex NOT DETECTED NOT DETECTED Final   Escherichia coli NOT DETECTED NOT DETECTED Final   Klebsiella aerogenes NOT DETECTED NOT DETECTED Final   Klebsiella oxytoca NOT DETECTED NOT DETECTED Final   Klebsiella pneumoniae NOT DETECTED NOT DETECTED Final   Proteus species NOT DETECTED NOT DETECTED Final   Salmonella species NOT DETECTED NOT DETECTED Final   Serratia marcescens NOT DETECTED NOT DETECTED Final   Haemophilus influenzae NOT DETECTED NOT DETECTED Final   Neisseria meningitidis NOT DETECTED NOT DETECTED Final   Pseudomonas aeruginosa NOT DETECTED NOT DETECTED Final   Stenotrophomonas maltophilia NOT DETECTED NOT DETECTED Final   Candida albicans NOT DETECTED NOT DETECTED Final   Candida auris NOT DETECTED NOT DETECTED Final   Candida glabrata NOT DETECTED NOT DETECTED Final   Candida krusei NOT DETECTED NOT DETECTED Final   Candida parapsilosis NOT DETECTED NOT DETECTED Final   Candida tropicalis NOT DETECTED NOT DETECTED Final   Cryptococcus neoformans/gattii NOT DETECTED NOT DETECTED Final    Comment: Performed at [facility], [address], [place], [place] [address]  Blood Culture (routine x 2)     Status: None (Preliminary result)   Collection Time: 04/12/23  1:10 PM   Specimen: BLOOD RIGHT HAND  Result Value Ref Range Status   Specimen Description   Final    BLOOD RIGHT HAND BOTTLES DRAWN AEROBIC AND ANAEROBIC Performed at [facility], [address], [place], [place] [address]    Special Requests   Final    Blood Culture results may not be optimal due to an inadequate volume of blood received in culture bottles Performed at [facility], [address] [place], [place] [address]    Culture   Final    NO GROWTH 2 DAYS Performed at [facility], [address] [place], [place] [address]    Report Status PENDING  Incomplete  MRSA Next Gen by PCR, Nasal     Status: None   Collection Time: 04/13/23  1:51 AM   Specimen: Nasal Mucosa; Nasal Swab  Result Value Ref Range Status   MRSA by PCR Next Gen NOT DETECTED NOT DETECTED Final    Comment: (NOTE) The GeneXpert MRSA Assay (FDA approved for NASAL specimens only), is one component of a comprehensive MRSA colonization surveillance program. It is not intended to diagnose MRSA infection nor to guide or monitor treatment for MRSA infections.  Test performance is not FDA approved in patients less than 88 years old. Performed at [facility], [address] [place], [place] [address]     Radiology Reports CT Chest Wo Contrast Result Date: 04/12/2023 CLINICAL DATA:  Pneumonia, complications suspected EXAM: CT CHEST WITHOUT CONTRAST TECHNIQUE: Multidetector CT imaging of the chest was performed following the standard protocol without IV contrast. RADIATION DOSE REDUCTION: This exam was performed according to the departmental dose-optimization program which includes automated exposure control, adjustment of the mA and/or kV according to patient size and/or use of iterative reconstruction technique. COMPARISON:  CT 02/10/2020 and previous FINDINGS: Cardiovascular: Heart size normal. No pericardial effusion. Mitral annulus calcifications. 3-vessel coronary calcifications. Aortic Atherosclerosis (ICD10-170.0). Mediastinum/Nodes: No mass or adenopathy. Lungs/Pleura: No pleural effusion. No pneumothorax. Subpleural subcentimeter calcified granuloma in the left lung apex. Subsegmental atelectasis or scarring extending from the hilum to the pleural surface in the lateral basal segment right lower lobe. There is some linear subsegmental atelectasis in both lung bases. Fluid/debris in lateral lobar airways in the right lower lobe. Upper Abdomen: Layering hyperdense material in the nondilated  gallbladder. 7.7 cm 13 HU exophytic left renal lesion cyst incompletely visualized, better characterized on prior CT; no follow-up recommended. Musculoskeletal: Thoracolumbar dextroscoliosis apex L1 with vertebral endplate spurring at multiple levels in the mid and lower thoracic spine. IMPRESSION: 1. No acute findings. 2. Subsegmental atelectasis or scarring lateral basal segment right lower lobe. 3. Coronary and   Aortic Atherosclerosis (ICD10-I70.0). Electronically Signed   By: Corlis Leak M.D.   On: 04/12/2023 16:07   DG Chest Port 1 View Result Date: 04/12/2023 CLINICAL DATA:  Questionable sepsis EXAM: PORTABLE CHEST 1 VIEW COMPARISON:  04/02/2022 FINDINGS: Patient rotated to the right. Wire, lead, and clothing artifact projects over the chest. A presumably artifactual density projects over the medial right lower hemithorax. Normal heart size. Tortuous thoracic aorta. No pleural effusion or pneumothorax. Clear lungs. IMPRESSION: Artifact and position degraded exam. Given this limitation, no evidence of pneumonia. Electronically Signed   By: Jeronimo Greaves M.D.   On: 04/12/2023 13:47      Signature  -   Fran Lowes M.D on 04/14/2023 at 7:16 PM   -  To page go to [email or web address]

## 2023-04-15 DIAGNOSIS — J9601 Acute respiratory failure with hypoxia: Secondary | ICD-10-CM | POA: Diagnosis not present

## 2023-04-15 LAB — BASIC METABOLIC PANEL
Anion gap: 10 (ref 5–15)
BUN: 37 mg/dL — ABNORMAL HIGH (ref 8–23)
CO2: 27 mmol/L (ref 22–32)
Calcium: 9.4 mg/dL (ref 8.9–10.3)
Chloride: 104 mmol/L (ref 98–111)
Creatinine, Ser: 1.4 mg/dL — ABNORMAL HIGH (ref 0.44–1.00)
GFR, Estimated: 36 mL/min — ABNORMAL LOW (ref >60)
Glucose, Bld: 94 mg/dL (ref 70–99)
Potassium: 4.5 mmol/L (ref 3.5–5.1)
Sodium: 141 mmol/L (ref 135–145)

## 2023-04-15 LAB — CBC WITH DIFFERENTIAL/PLATELET
Abs Immature Granulocytes: 0.1 10*3/uL — ABNORMAL HIGH (ref 0.00–0.07)
Basophils Absolute: 0 10*3/uL (ref 0.0–0.1)
Basophils Relative: 0 %
Eosinophils Absolute: 0 10*3/uL (ref 0.0–0.5)
Eosinophils Relative: 0 %
HCT: 41.2 % (ref 36.0–46.0)
Hemoglobin: 13 g/dL (ref 12.0–15.0)
Immature Granulocytes: 1 %
Lymphocytes Relative: 19 %
Lymphs Abs: 2 10*3/uL (ref 0.7–4.0)
MCH: 28.2 pg (ref 26.0–34.0)
MCHC: 31.6 g/dL (ref 30.0–36.0)
MCV: 89.4 fL (ref 80.0–100.0)
Monocytes Absolute: 1 10*3/uL (ref 0.1–1.0)
Monocytes Relative: 10 %
Neutro Abs: 7.5 10*3/uL (ref 1.7–7.7)
Neutrophils Relative %: 70 %
Platelets: 305 10*3/uL (ref 150–400)
RBC: 4.61 MIL/uL (ref 3.87–5.11)
RDW: 13.9 % (ref 11.5–15.5)
WBC: 10.6 10*3/uL — ABNORMAL HIGH (ref 4.0–10.5)
nRBC: 0 % (ref 0.0–0.2)

## 2023-04-15 LAB — CULTURE, BLOOD (ROUTINE X 2): Special Requests: ADEQUATE

## 2023-04-15 MED ORDER — AZITHROMYCIN 250 MG PO TABS
250.0000 mg | ORAL_TABLET | ORAL | Status: AC
  Administered 2023-04-15 – 2023-04-16 (×2): 250 mg via ORAL
  Filled 2023-04-15 (×2): qty 1

## 2023-04-15 NOTE — [place]2 (Signed)
[place] MEDICAID [place]2 LEVEL OF CARE FORM     IDENTIFICATION  Patient Name: [name] Birthdate: April 11, 1932 Sex: female Admission Date (Current Location): 04/12/2023  [facility] and [place] Number:  Producer, television/film/video and Address:  [facility],  [address] [place] [address]      Provider Number:    Attending Physician Name and Address:  Fran Lowes, DO  Relative Name and Phone Number:  Tehila, Sokolow)  [phone]    Current Level of Care: Hospital Recommended Level of Care: Skilled Nursing Facility Prior Approval Number:    Date Approved/Denied:   PASRR Number: [id number]  Discharge Plan: SNF    Current Diagnoses: Patient Active Problem List   Diagnosis Date Noted   Community acquired pneumonia 04/13/2023   Pressure injury of skin 04/13/2023   Acute respiratory failure with hypoxia (HCC) 04/12/2023   AKI (acute kidney injury) (HCC) 04/12/2023   Lactic acidosis 04/12/2023   Influenza A 04/12/2023   Dementia (HCC)    Upper GI bleed    Aspiration pneumonia (HCC)    Leukocytosis    Melena    Acute blood loss anemia    Duodenal ulcer hemorrhage    Hypoxemia 09/03/2015   Hypokalemia 09/03/2015   Asthma exacerbation 09/03/2015   Encounter for screening colonoscopy 09/24/2013   Elevated LFTs 09/24/2013   CKD stage 3b, GFR 30-44 ml/min (HCC)    Spinal stenosis    Food impaction of esophagus 08/02/2011   GERD 09/23/2009   RENAL INSUFFICIENCY 09/23/2009   GASTROINTESTINAL HEMORRHAGE, HX OF 08/13/2008    Orientation RESPIRATION BLADDER Height & Weight     Self  O2 (2L) Incontinent Weight: 157 lb (71.2 kg) Height:  5\' 2"  (157.5 cm)  BEHAVIORAL SYMPTOMS/MOOD NEUROLOGICAL BOWEL NUTRITION STATUS      Incontinent Diet (See discharge summary)  AMBULATORY STATUS COMMUNICATION OF NEEDS Skin   Extensive Assist Verbally PU Stage and Appropriate Care, Other (Comment)   PU Stage 2 Dressing:  (PRN)                   Personal Care  Assistance Level of Assistance  Bathing, Feeding, Dressing Bathing Assistance: Maximum assistance Feeding assistance: Limited assistance Dressing Assistance: Maximum assistance     Functional Limitations Info  Sight, Hearing, Speech Sight Info: Adequate Hearing Info: Adequate Speech Info: Adequate    SPECIAL CARE FACTORS FREQUENCY  PT (By licensed PT), OT (By licensed OT)     PT Frequency: 5x/wk OT Frequency: 5x/wk            Contractures Contractures Info: Not present    Additional Factors Info  Code Status, Allergies Code Status Info: FULL Allergies Info: Tape, Demerol (Meperidine)           Current Medications (04/15/2023):  This is the current hospital active medication list Current Facility-Administered Medications  Medication Dose Route Frequency Provider Last Rate Last Admin   acetaminophen (TYLENOL) tablet 650 mg  650 mg Oral Q6H PRN Bobette Mo, MD       Or   acetaminophen (TYLENOL) suppository 650 mg  650 mg Rectal Q6H PRN Bobette Mo, MD       albuterol (PROVENTIL) (2.5 MG/3ML) 0.083% nebulizer solution 2.5 mg  2.5 mg Nebulization Q4H PRN Bobette Mo, MD       amLODipine (NORVASC) tablet 10 mg  10 mg Oral Daily Steffanie Rainwater, MD   10 mg at 04/15/23 1110   azithromycin (ZITHROMAX) tablet 250 mg  250  mg Oral Q24H Norva Pavlov, RPH       cefTRIAXone (ROCEPHIN) 1 g in sodium chloride 0.9 % 100 mL IVPB  1 g Intravenous Q24H Bobette Mo, MD 200 mL/hr at 04/15/23 0129 Infusion Verify at 04/15/23 0129   colestipol (COLESTID) tablet 1 g  1 g Oral Daily Bobette Mo, MD   1 g at 04/15/23 1110   donepezil (ARICEPT) tablet 10 mg  10 mg Oral QHS Bobette Mo, MD   10 mg at 04/14/23 2127   famotidine (PEPCID) tablet 10 mg  10 mg Oral Daily Bobette Mo, MD   10 mg at 04/15/23 1111   heparin injection 5,000 Units  5,000 Units Subcutaneous Q8H Bobette Mo, MD   5,000 Units at 04/15/23 0533    levothyroxine (SYNTHROID) tablet 75 mcg  75 mcg Oral QAC breakfast Bobette Mo, MD   75 mcg at 04/15/23 0533   memantine (NAMENDA) tablet 5 mg  5 mg Oral BID Bobette Mo, MD   5 mg at 04/15/23 1111   metoprolol tartrate (LOPRESSOR) tablet 25 mg  25 mg Oral BID Steffanie Rainwater, MD   25 mg at 04/15/23 1110   ondansetron (ZOFRAN) tablet 4 mg  4 mg Oral Q6H PRN Bobette Mo, MD       Or   ondansetron [facility]) injection 4 mg  4 mg Intravenous Q6H PRN Bobette Mo, MD       pantoprazole (PROTONIX) EC tablet 40 mg  40 mg Oral Daily Bobette Mo, MD   40 mg at 04/15/23 1111   predniSONE (DELTASONE) tablet 40 mg  40 mg Oral Q breakfast Bobette Mo, MD   40 mg at 04/15/23 4098     Discharge Medications: Please see discharge summary for a list of discharge medications.  Relevant Imaging Results:  Relevant Lab Results:   Additional Information SSN: [id number]  Otelia Santee, LCSW

## 2023-04-15 NOTE — TOC Initial Note (Signed)
Transition of Care [facility]) - Initial/Assessment Note    Patient Details  Name: [name] MRN: [id number] Date of Birth: Feb 24, 1933  [facility]) CM/SW Contact:    Otelia Santee, LCSW Phone Number: 04/15/2023, 12:05 PM  Clinical Narrative:                 Pt from [facility]. Pt recommended for SNF placement. Spoke with pt's son who is agreeable to placement. Pt has been worked up for SNF and referrals have been sent out. Currently awaiting bed offers.   Expected Discharge Plan: Skilled Nursing Facility Barriers to Discharge: Continued Medical Work up, SNF Pending bed offer   Patient Goals and CMS Choice Patient states their goals for this hospitalization and ongoing recovery are:: For pt to go to SNF          Expected Discharge Plan and Services In-house Referral: Clinical Social Work Discharge Planning Services: NA Post Acute Care Choice: Skilled Nursing Facility Living arrangements for the past 2 months: Assisted Living Facility (Memory Care)                 DME Arranged: N/A DME Agency: NA                  Prior Living Arrangements/Services Living arrangements for the past 2 months: Assisted Living Facility (Memory Care) Lives with:: Facility Resident Patient language and need for interpreter reviewed:: Yes Do you feel safe going back to the place where you live?: Yes      Need for Family Participation in Patient Care: Yes (Comment) Care giver support system in place?: Yes (comment) Current home services: DME Criminal Activity/Legal Involvement Pertinent to Current Situation/Hospitalization: No - Comment as needed  Activities of Daily Living   ADL Screening (condition at time of admission) Independently performs ADLs?: No Does the patient have a NEW difficulty with bathing/dressing/toileting/self-feeding that is expected to last >3 days?: Yes (Initiates electronic notice to provider for possible OT consult) Does the  patient have a NEW difficulty with getting in/out of bed, walking, or climbing stairs that is expected to last >3 days?: Yes (Initiates electronic notice to provider for possible PT consult) Does the patient have a NEW difficulty with communication that is expected to last >3 days?: Yes (Initiates electronic notice to provider for possible SLP consult) Is the patient deaf or have difficulty hearing?: No Does the patient have difficulty seeing, even when wearing glasses/contacts?: No Does the patient have difficulty concentrating, remembering, or making decisions?: Yes  Permission Sought/Granted Permission sought to share information with : Facility Medical sales representative, Family Supports Permission granted to share information with : Yes, Verbal Permission Granted  Share Information with NAME: Sedra Morfin  Permission granted to share info w AGENCY: SNF and [facility]  Permission granted to share info w Relationship: Son     Emotional Assessment   Attitude/Demeanor/Rapport: Unable to Assess Affect (typically observed): Unable to Assess Orientation: : Oriented to Self Alcohol / Substance Use: Not Applicable Psych Involvement: No (comment)  Admission diagnosis:  Acute respiratory failure with hypoxia (HCC) [J96.01] Patient Active Problem List   Diagnosis Date Noted   Community acquired pneumonia 04/13/2023   Pressure injury of skin 04/13/2023   Acute respiratory failure with hypoxia (HCC) 04/12/2023   AKI (acute kidney injury) (HCC) 04/12/2023   Lactic acidosis 04/12/2023   Influenza A 04/12/2023   Dementia (HCC)    Upper GI bleed    Aspiration pneumonia (HCC)  Leukocytosis    Melena    Acute blood loss anemia    Duodenal ulcer hemorrhage    Hypoxemia 09/03/2015   Hypokalemia 09/03/2015   Asthma exacerbation 09/03/2015   Encounter for screening colonoscopy 09/24/2013   Elevated LFTs 09/24/2013   CKD stage 3b, GFR 30-44 ml/min (HCC)    Spinal stenosis    Food  impaction of esophagus 08/02/2011   GERD 09/23/2009   RENAL INSUFFICIENCY 09/23/2009   GASTROINTESTINAL HEMORRHAGE, HX OF 08/13/2008   PCP:  Pcp, No Pharmacy:   [facility] - [place], [place] - [address] [place] [address] Phone: [phone] Fax: [phone]     Social Drivers of Health (SDOH) Social History: SDOH Screenings   Food Insecurity: Patient Unable To Answer (04/12/2023)  Housing: Patient Unable To Answer (04/12/2023)  Transportation Needs: Patient Unable To Answer (04/12/2023)  Utilities: Patient Unable To Answer (04/12/2023)  Alcohol Screen: Low Risk  (07/22/2020)  Depression (PHQ2-9): Low Risk  (07/22/2020)  Financial Resource Strain: Low Risk  (07/22/2020)  Physical Activity: Sufficiently Active (07/22/2020)  Social Connections: Patient Unable To Answer (04/12/2023)  Stress: No Stress Concern Present (07/22/2020)  Tobacco Use: Low Risk  (04/12/2023)   SDOH Interventions:     Readmission Risk Interventions    04/15/2023   12:03 PM  Readmission Risk Prevention Plan  Transportation Screening Complete  PCP or Specialist Appt within 5-7 Days Complete  Home Care Screening Complete  Medication Review (RN CM) Complete

## 2023-04-15 NOTE — Progress Notes (Signed)
PROGRESS NOTE                                                                                                                                                                                                             Patient Demographics:    [name], is a 89 y.o. female, DOB - March 10, 1933, ZOX:[id number]  Outpatient Primary MD for the patient is Pcp, No    LOS - 3  Admit date - 04/12/2023    Chief Complaint  Patient presents with   Shortness of Breath       Brief Narrative (HPI from H&P)    [name] is a 89 y.o. female with medical history significant of stage 3b CKD, dementia, hyperlipidemia, hypertension, mild GERD, mitral valve prolapse, spinal stenosis, duodenal PUD, upper GI bleed with ABLA, elevated LFTs, food impaction of the esophagus, aspiration pneumonia, asthma who was diagnosed earlier this month with influenza and treated with Tamiflu at her SNF and was found hypoxic on morning of admission day prompting an EMS call. Patient was hypoxic with an O2 sat of 85% when they arrived and was placed on [facility].  Her O2 saturation improved with bronchodilators. She also received 125 mg of methylprednisolone. She was admitted for acute hypoxic respiratory failure secondary to pneumonia and asthma exacerbation.  On 1/27 the patient was saturating 98% on 2L. Continue to wean prednisone and O2.   Subjective:    [name] was resting comfortably in bed, No new complaints. She states that her breathing is still difficult. She continues to have a coarse productive cough.   Assessment  & Plan :    Assessment and Plan: Acute hypoxic respiratory failure Community-acquired pneumonia Recent influenza A infection Patient presented after being hypoxic at her SNF in the setting of a recently treated influenza A. She remains on 2 L [place]. Urinary strep pneumo positive, negative MRSA screen. Blood culture positive for  staph species in 2 bottles from the same set which is likely a contaminant. She remains afebrile, leukocytosis trending down.  -Continue IV Rocephin and azithromycin -Start Mucinex DM twice daily -Continue supplemental O2, wean as able -Follow-up repeat procalcitonin -WBC has decreased from 13.8 to 10.6 on 04/15/2023.  Asthma exacerbation No significant wheezing on exam. Continues to require supplemental oxygen. -Wean oxygen as possible -Wean prednisone as possible -Continue as needed  albuterol nebs  HTN BP elevated with SBP in the 160s to 170s. -Continue all amlodipine and metoprolol  AKI on CKD 3B Baseline creatinine around 1.6-1.7. AKI resolved with creatinine improving from 2.27 on admission to 1.40 today. -Trend BMP  Lactic acidosis, resolved Lactic acid peaked at 4.2, down to 0.7 this morning.  Hypothyroidism -Continue Synthroid  Dementia -Continue memantine and donepezil  GERD -Continue famotidine  Generalized debility -PT/OT eval        Estimated body mass index is 28.72 kg/m as calculated from the following:   Height as of this encounter: 5\' 2"  (1.575 m).   Weight as of this encounter: 71.2 kg.          Condition -improving slowly  Family Communication  : Discussed plan with son over the phone  Code Status : Full code  Consults  : None  PUD Prophylaxis : Famotidine   Procedures  :     None      Disposition Plan  :  tbd  Status is: Inpatient Remains inpatient appropriate because: Acute hypoxic respiratory failure, pneumonia treatment  DVT Prophylaxis  :    heparin injection 5,000 Units Start: 04/12/23 2200  Lab Results  Component Value Date   PLT 305 04/15/2023   Diet :  Diet Order             Diet regular Fluid consistency: Thin  Diet effective now                    Inpatient Medications  Scheduled Meds:  amLODipine  10 mg Oral Daily   azithromycin  250 mg Oral Q24H   colestipol  1 g Oral Daily   donepezil  10 mg  Oral QHS   famotidine  10 mg Oral Daily   heparin  5,000 Units Subcutaneous Q8H   levothyroxine  75 mcg Oral QAC breakfast   memantine  5 mg Oral BID   metoprolol tartrate  25 mg Oral BID   pantoprazole  40 mg Oral Daily   predniSONE  40 mg Oral Q breakfast   Continuous Infusions:  cefTRIAXone (ROCEPHIN)  IV 1 g (04/15/23 1724)   PRN Meds:.acetaminophen **OR** acetaminophen, albuterol, ondansetron **OR** ondansetron (ZOFRAN) IV  Antibiotics  :    Anti-infectives (From admission, onward)    Start     Dose/Rate Route Frequency Ordered Stop   04/15/23 1700  azithromycin (ZITHROMAX) tablet 250 mg        250 mg Oral Every 24 hours 04/15/23 1102 04/17/23 1659   04/12/23 1600  cefTRIAXone (ROCEPHIN) 1 g in sodium chloride 0.9 % 100 mL IVPB        1 g 200 mL/hr over 30 Minutes Intravenous Every 24 hours 04/12/23 1534 04/17/23 1559   04/12/23 1600  azithromycin (ZITHROMAX) 500 mg in sodium chloride 0.9 % 250 mL IVPB  Status:  Discontinued        500 mg 250 mL/hr over 60 Minutes Intravenous Every 24 hours 04/12/23 1534 04/15/23 1102         Objective:   Vitals:   04/14/23 1328 04/14/23 2053 04/15/23 0459 04/15/23 1516  BP: (!) 162/81 (!) 177/86 (!) 186/89 (!) 158/107  Pulse:  83 70 95  Resp: 18 20  19   Temp: (!) 97 F (36.1 C) 98.8 F (37.1 C) 97.6 F (36.4 C) 98.4 F (36.9 C)  TempSrc: Axillary Oral Axillary   SpO2: 100% 96% 98% 92%  Weight:      Height:  Wt Readings from Last 3 Encounters:  04/12/23 71.2 kg  10/25/21 71.2 kg  09/15/21 73 kg     Intake/Output Summary (Last 24 hours) at 04/15/2023 1842 Last data filed at 04/15/2023 0459 Gross per 24 hour  Intake 941.27 ml  Output 500 ml  Net 441.27 ml   Exam:  Constitutional:  The patient is awake, alert, and oriented x 3. She continues to be dyspneic with conversation. Respiratory:  No increaed work of breathing. No rales, or rhonchi Positive for coarse coughs and wheezes throughout. No tactile  fremitus Cardiovascular:  Regular rate and rhythm No murmurs, ectopy, or gallups. No lateral PMI. No thrills. Abdomen:  Abdomen is soft, non-tender, non-distended No hernias, masses, or organomegaly Normoactive bowel sounds.  Musculoskeletal:  No cyanosis, clubbing, or edema Skin:  No rashes, lesions, ulcers palpation of skin: no induration or nodules Neurologic:  CN 2-12 intact Sensation all 4 extremities intact   RN pressure injury documentation: Pressure Injury 08/20/21 Buttocks Mid Stage II -  Partial thickness loss of dermis presenting as a shallow open ulcer with a red, pink wound bed without slough. (Active)  08/20/21 1948  Location: Buttocks  Location Orientation: Mid  Staging: Stage II -  Partial thickness loss of dermis presenting as a shallow open ulcer with a red, pink wound bed without slough.  Wound Description (Comments):   Present on Admission: Yes     Pressure Injury 08/20/21 Hip Anterior;Right Stage 2 -  Partial thickness loss of dermis presenting as a shallow open injury with a red, pink wound bed without slough. Long pressure wound to hip area (Active)  08/20/21 2159  Location: Hip  Location Orientation: Anterior;Right  Staging: Stage 2 -  Partial thickness loss of dermis presenting as a shallow open injury with a red, pink wound bed without slough.  Wound Description (Comments): Long pressure wound to hip area  Present on Admission: -- (Unknown)     Pressure Injury 04/12/23 Buttocks Right Stage 2 -  Partial thickness loss of dermis presenting as a shallow open injury with a red, pink wound bed without slough. (Active)  04/12/23 1729  Location: Buttocks  Location Orientation: Right  Staging: Stage 2 -  Partial thickness loss of dermis presenting as a shallow open injury with a red, pink wound bed without slough.  Wound Description (Comments):   Present on Admission: Yes  Dressing Type Foam - Lift dressing to assess site every shift 04/15/23 0850       Data Review:    Recent Labs  Lab 04/12/23 1232 04/13/23 0708 04/14/23 0612 04/15/23 0507  WBC 13.7* 11.9* 13.8* 10.6*  HGB 12.5 11.6* 12.1 13.0  HCT 40.2 36.8 39.3 41.2  PLT 294 249 306 305  MCV 90.7 89.1 91.6 89.4  MCH 28.2 28.1 28.2 28.2  MCHC 31.1 31.5 30.8 31.6  RDW 14.2 14.2 14.3 13.9  LYMPHSABS 2.6  --   --  2.0  MONOABS 1.4*  --   --  1.0  EOSABS 0.0  --   --  0.0  BASOSABS 0.0  --   --  0.0    Recent Labs  Lab 04/12/23 1232 04/12/23 1242 04/12/23 1427 04/12/23 1825 04/12/23 2312 04/13/23 0708 04/14/23 0612 04/15/23 0507  NA 138  --   --   --   --  141 144 141  K 4.0  --   --   --   --  4.1 4.6 4.5  CL 100  --   --   --   --  108 110 104  CO2 23  --   --   --   --  21* 25 27  ANIONGAP 15  --   --   --   --  12 9 10   GLUCOSE 128*  --   --   --   --  117* 102* 94  BUN 41*  --   --   --   --  33* 37* 37*  CREATININE 2.27*  --   --   --   --  1.42* 1.26* 1.40*  AST 27  --   --   --   --  19  --   --   ALT 20  --   --   --   --  18  --   --   ALKPHOS 94  --   --   --   --  78  --   --   BILITOT 0.5  --   --   --   --  0.5  --   --   ALBUMIN 3.8  --   --   --   --  3.0*  --   --   PROCALCITON  --   --   --  0.43  --  0.44  --   --   LATICACIDVEN  --  2.0* 2.7* 4.2* 2.7* 0.7  --   --   INR 1.1  --   --   --   --   --   --   --   BNP 95.5  --   --   --   --   --   --   --   MG  --   --   --  2.1  --   --   --   --   CALCIUM 9.2  --   --   --   --  8.5* 9.2 9.4      Recent Labs  Lab 04/12/23 1232 04/12/23 1242 04/12/23 1427 04/12/23 1825 04/12/23 2312 04/13/23 0708 04/14/23 0612 04/15/23 0507  PROCALCITON  --   --   --  0.43  --  0.44  --   --   LATICACIDVEN  --  2.0* 2.7* 4.2* 2.7* 0.7  --   --   INR 1.1  --   --   --   --   --   --   --   BNP 95.5  --   --   --   --   --   --   --   MG  --   --   --  2.1  --   --   --   --   CALCIUM 9.2  --   --   --   --  8.5* 9.2 9.4     --------------------------------------------------------------------------------------------------------------- Lab Results  Component Value Date   CHOL 149 05/23/2020   HDL 65 05/23/2020   LDLCALC 66 05/23/2020   TRIG 95 05/23/2020   CHOLHDL 2.3 05/23/2020    Lab Results  Component Value Date   HGBA1C 5.6 09/04/2015   No results for input(s): "TSH", "T4TOTAL", "FREET4", "T3FREE", "THYROIDAB" in the last 72 hours. No results for input(s): "VITAMINB12", "FOLATE", "FERRITIN", "TIBC", "IRON", "RETICCTPCT" in the last 72 hours. ------------------------------------------------------------------------------------------------------------------ Cardiac Enzymes No results for input(s): "CKMB", "TROPONINI", "MYOGLOBIN" in the last 168 hours.  Invalid input(s): "CK"  Micro Results Recent Results (from the past 240 hours)  Resp panel by RT-PCR (RSV,  Flu A&B, Covid)     Status: Abnormal   Collection Time: 04/12/23 12:14 PM   Specimen: Nasal Swab  Result Value Ref Range Status   SARS Coronavirus 2 by RT PCR NEGATIVE NEGATIVE Final    Comment: (NOTE) SARS-CoV-2 target nucleic acids are NOT DETECTED.  The SARS-CoV-2 RNA is generally detectable in upper respiratory specimens during the acute phase of infection. The lowest concentration of SARS-CoV-2 viral copies this assay can detect is 138 copies/mL. A negative result does not preclude SARS-Cov-2 infection and should not be used as the sole basis for treatment or other patient management decisions. A negative result may occur with  improper specimen collection/handling, submission of specimen other than nasopharyngeal swab, presence of viral mutation(s) within the areas targeted by this assay, and inadequate number of viral copies(<138 copies/mL). A negative result must be combined with clinical observations, patient history, and epidemiological information. The expected result is Negative.  Fact Sheet for Patients:   BloggerCourse.com  Fact Sheet for Healthcare Providers:  SeriousBroker.it  This test is no t yet approved or cleared by the [place] FDA and  has been authorized for detection and/or diagnosis of SARS-CoV-2 by FDA under an Emergency Use Authorization (EUA). This EUA will remain  in effect (meaning this test can be used) for the duration of the COVID-19 declaration under Section 564(b)(1) of the Act, 21 U.S.C.section 360bbb-3(b)(1), unless the authorization is terminated  or revoked sooner.       Influenza A by PCR POSITIVE (A) NEGATIVE Final   Influenza B by PCR NEGATIVE NEGATIVE Final    Comment: (NOTE) The Xpert Xpress SARS-CoV-2/FLU/RSV plus assay is intended as an aid in the diagnosis of influenza from Nasopharyngeal swab specimens and should not be used as a sole basis for treatment. Nasal washings and aspirates are unacceptable for Xpert Xpress SARS-CoV-2/FLU/RSV testing.  Fact Sheet for Patients: BloggerCourse.com  Fact Sheet for Healthcare Providers: SeriousBroker.it  This test is not yet approved or cleared by the [place] FDA and has been authorized for detection and/or diagnosis of SARS-CoV-2 by FDA under an Emergency Use Authorization (EUA). This EUA will remain in effect (meaning this test can be used) for the duration of the COVID-19 declaration under Section 564(b)(1) of the Act, 21 U.S.C. section 360bbb-3(b)(1), unless the authorization is terminated or revoked.     Resp Syncytial Virus by PCR NEGATIVE NEGATIVE Final    Comment: (NOTE) Fact Sheet for Patients: BloggerCourse.com  Fact Sheet for Healthcare Providers: SeriousBroker.it  This test is not yet approved or cleared by the [place] FDA and has been authorized for detection and/or diagnosis of SARS-CoV-2 by FDA under an Emergency Use  Authorization (EUA). This EUA will remain in effect (meaning this test can be used) for the duration of the COVID-19 declaration under Section 564(b)(1) of the Act, 21 U.S.C. section 360bbb-3(b)(1), unless the authorization is terminated or revoked.  Performed at [facility], [address], [place], [place] [address]   Blood Culture (routine x 2)     Status: Abnormal   Collection Time: 04/12/23 12:31 PM   Specimen: BLOOD  Result Value Ref Range Status   Specimen Description   Final    BLOOD RIGHT ANTECUBITAL Performed at [facility], [address], [place], [place] [address]    Special Requests   Final    BOTTLES DRAWN AEROBIC AND ANAEROBIC Blood Culture adequate volume Performed at [facility], [address], [place], [place] [address]  Culture  Setup Time   Final    GRAM POSITIVE COCCI IN CLUSTERS IN BOTH AEROBIC AND ANAEROBIC BOTTLES CRITICAL RESULT CALLED TO, READ BACK BY AND VERIFIED WITH: PHARMD ABBY ELLINGTON [phone] 1009 BY J RAZZAK, MT    Culture (A)  Final    STAPHYLOCOCCUS HOMINIS STAPHYLOCOCCUS EPIDERMIDIS THE SIGNIFICANCE OF ISOLATING THIS ORGANISM FROM A SINGLE SET OF BLOOD CULTURES WHEN MULTIPLE SETS ARE DRAWN IS UNCERTAIN. PLEASE NOTIFY THE MICROBIOLOGY DEPARTMENT WITHIN ONE WEEK IF SPECIATION AND SENSITIVITIES ARE REQUIRED. Performed at [facility], [address], [place], [place] [address]    Report Status 04/15/2023 FINAL  Final  Blood Culture ID Panel (Reflexed)     Status: Abnormal   Collection Time: 04/12/23 12:31 PM  Result Value Ref Range Status   Enterococcus faecalis NOT DETECTED NOT DETECTED Final   Enterococcus Faecium NOT DETECTED NOT DETECTED Final   Listeria monocytogenes NOT DETECTED NOT DETECTED Final   Staphylococcus species DETECTED (A) NOT DETECTED Final    Comment: CRITICAL RESULT CALLED TO, READ BACK BY AND VERIFIED WITH: PHARMD ABBY ELLINGTON [phone] 1009 BY J  RAZZAK, MT    Staphylococcus aureus (BCID) NOT DETECTED NOT DETECTED Final   Staphylococcus epidermidis NOT DETECTED NOT DETECTED Final   Staphylococcus lugdunensis NOT DETECTED NOT DETECTED Final   Streptococcus species NOT DETECTED NOT DETECTED Final   Streptococcus agalactiae NOT DETECTED NOT DETECTED Final   Streptococcus pneumoniae NOT DETECTED NOT DETECTED Final   Streptococcus pyogenes NOT DETECTED NOT DETECTED Final   A.calcoaceticus-baumannii NOT DETECTED NOT DETECTED Final   Bacteroides fragilis NOT DETECTED NOT DETECTED Final   Enterobacterales NOT DETECTED NOT DETECTED Final   Enterobacter cloacae complex NOT DETECTED NOT DETECTED Final   Escherichia coli NOT DETECTED NOT DETECTED Final   Klebsiella aerogenes NOT DETECTED NOT DETECTED Final   Klebsiella oxytoca NOT DETECTED NOT DETECTED Final   Klebsiella pneumoniae NOT DETECTED NOT DETECTED Final   Proteus species NOT DETECTED NOT DETECTED Final   Salmonella species NOT DETECTED NOT DETECTED Final   Serratia marcescens NOT DETECTED NOT DETECTED Final   Haemophilus influenzae NOT DETECTED NOT DETECTED Final   Neisseria meningitidis NOT DETECTED NOT DETECTED Final   Pseudomonas aeruginosa NOT DETECTED NOT DETECTED Final   Stenotrophomonas maltophilia NOT DETECTED NOT DETECTED Final   Candida albicans NOT DETECTED NOT DETECTED Final   Candida auris NOT DETECTED NOT DETECTED Final   Candida glabrata NOT DETECTED NOT DETECTED Final   Candida krusei NOT DETECTED NOT DETECTED Final   Candida parapsilosis NOT DETECTED NOT DETECTED Final   Candida tropicalis NOT DETECTED NOT DETECTED Final   Cryptococcus neoformans/gattii NOT DETECTED NOT DETECTED Final    Comment: Performed at [facility], [address], [place], [place] [address]  Blood Culture (routine x 2)     Status: None (Preliminary result)   Collection Time: 04/12/23  1:10 PM   Specimen: BLOOD RIGHT HAND  Result Value Ref Range Status   Specimen Description    Final    BLOOD RIGHT HAND BOTTLES DRAWN AEROBIC AND ANAEROBIC Performed at [facility], [address], [place], [place] [address]    Special Requests   Final    Blood Culture results may not be optimal due to an inadequate volume of blood received in culture bottles Performed at [facility], [address], [place], [place] [address]    Culture   Final    NO GROWTH 3 DAYS Performed at [facility], [address], [place], [place] [address]    Report Status PENDING  Incomplete  MRSA Next Gen by PCR, Nasal     Status: None   Collection Time: 04/13/23  1:51 AM   Specimen: Nasal Mucosa; Nasal Swab  Result Value Ref Range Status   MRSA by PCR Next Gen NOT DETECTED NOT DETECTED Final    Comment: (NOTE) The GeneXpert MRSA Assay (FDA approved for NASAL specimens only), is one component of a comprehensive MRSA colonization surveillance program. It is not intended to diagnose MRSA infection nor to guide or monitor treatment for MRSA infections. Test performance is not FDA approved in patients less than 19 years old. Performed at [facility], [address] [place], [place] [address]     Radiology Reports CT Chest Wo Contrast Result Date: 04/12/2023 CLINICAL DATA:  Pneumonia, complications suspected EXAM: CT CHEST WITHOUT CONTRAST TECHNIQUE: Multidetector CT imaging of the chest was performed following the standard protocol without IV contrast. RADIATION DOSE REDUCTION: This exam was performed according to the departmental dose-optimization program which includes automated exposure control, adjustment of the mA and/or kV according to patient size and/or use of iterative reconstruction technique. COMPARISON:  CT 02/10/2020 and previous FINDINGS: Cardiovascular: Heart size normal. No pericardial effusion. Mitral annulus calcifications. 3-vessel coronary calcifications. Aortic Atherosclerosis (ICD10-170.0). Mediastinum/Nodes: No  mass or adenopathy. Lungs/Pleura: No pleural effusion. No pneumothorax. Subpleural subcentimeter calcified granuloma in the left lung apex. Subsegmental atelectasis or scarring extending from the hilum to the pleural surface in the lateral basal segment right lower lobe. There is some linear subsegmental atelectasis in both lung bases. Fluid/debris in lateral lobar airways in the right lower lobe. Upper Abdomen: Layering hyperdense material in the nondilated gallbladder. 7.7 cm 13 HU exophytic left renal lesion cyst incompletely visualized, better characterized on prior CT; no follow-up recommended. Musculoskeletal: Thoracolumbar dextroscoliosis apex L1 with vertebral endplate spurring at multiple levels in the mid and lower thoracic spine. IMPRESSION: 1. No acute findings. 2. Subsegmental atelectasis or scarring lateral basal segment right lower lobe. 3. Coronary and   Aortic Atherosclerosis (ICD10-I70.0). Electronically Signed   By: Corlis Leak M.D.   On: 04/12/2023 16:07   DG Chest Port 1 View Result Date: 04/12/2023 CLINICAL DATA:  Questionable sepsis EXAM: PORTABLE CHEST 1 VIEW COMPARISON:  04/02/2022 FINDINGS: Patient rotated to the right. Wire, lead, and clothing artifact projects over the chest. A presumably artifactual density projects over the medial right lower hemithorax. Normal heart size. Tortuous thoracic aorta. No pleural effusion or pneumothorax. Clear lungs. IMPRESSION: Artifact and position degraded exam. Given this limitation, no evidence of pneumonia. Electronically Signed   By: Jeronimo Greaves M.D.   On: 04/12/2023 13:47      Signature  -   Fran Lowes M.D on 04/15/2023 at 6:42 PM   -  To page go to [email or web address]

## 2023-04-15 NOTE — Plan of Care (Signed)
  Problem: Fluid Volume: Goal: Hemodynamic stability will improve Outcome: Progressing   Problem: Clinical Measurements: Goal: Diagnostic test results will improve Outcome: Progressing Goal: Signs and symptoms of infection will decrease Outcome: Progressing   Problem: Clinical Measurements: Goal: Ability to maintain clinical measurements within normal limits will improve Outcome: Progressing Goal: Will remain free from infection Outcome: Progressing Goal: Respiratory complications will improve Outcome: Progressing Goal: Cardiovascular complication will be avoided Outcome: Progressing   Problem: Nutrition: Goal: Adequate nutrition will be maintained Outcome: Progressing   Problem: Coping: Goal: Level of anxiety will decrease Outcome: Progressing   Problem: Elimination: Goal: Will not experience complications related to bowel motility Outcome: Progressing   Problem: Pain Managment: Goal: General experience of comfort will improve and/or be controlled Outcome: Progressing   Problem: Safety: Goal: Ability to remain free from injury will improve Outcome: Progressing   Problem: Skin Integrity: Goal: Risk for impaired skin integrity will decrease Outcome: Progressing   Problem: Respiratory: Goal: Ability to maintain a clear airway will improve Outcome: Progressing Goal: Levels of oxygenation will improve Outcome: Progressing Goal: Ability to maintain adequate ventilation will improve Outcome: Progressing   Problem: Activity: Goal: Ability to tolerate increased activity will improve Outcome: Progressing   Problem: Respiratory: Goal: Ability to maintain adequate ventilation will improve Outcome: Progressing Goal: Ability to maintain a clear airway will improve Outcome: Progressing

## 2023-04-15 NOTE — Evaluation (Signed)
Occupational Therapy Evaluation Patient Details Name: [name] MRN: [id number] DOB: 1932-08-22 Today's Date: 04/15/2023   History of Present Illness 89 yo female admitted with acute respiratory failure, Pna. Recent flu and scabies treatment. Hx of dementia, MVP, spinal stenosis, staph infection,ckd   Clinical Impression   Pt presents with decline in function and safety with ADL and ADL mobility with impaired strength, balance, endurance and cognition. Pt is a poor historian, therefore uncertain about PLOF with ADLs and ADL mobility at this time. PTA pt lived at an ALF and was ambulatory wit rollater per chart review and required assist with ADLs. Pt currently requires mod A to sit EOB, max A with UB ADLs, total A with LB ADLs and toileting and mod A with sit - stand, SPTs. Pt pleasantly confused. Recommend post acute rehab unless ALF able to provide current level of care. Pt would benefit from acute OT services to maximize level of function and safety       If plan is discharge home, recommend the following: A lot of help with bathing/dressing/bathroom;A lot of help with walking and/or transfers;Assist for transportation;Direct supervision/assist for medications management;Supervision due to cognitive status;Help with stairs or ramp for entrance    Functional Status Assessment  Patient has had a recent decline in their functional status and demonstrates the ability to make significant improvements in function in a reasonable and predictable amount of time.  Equipment Recommendations  None recommended by OT    Recommendations for Other Services       Precautions / Restrictions Precautions Precautions: Fall Restrictions Weight Bearing Restrictions Per Provider Order: No      Mobility Bed Mobility Overal bed mobility: Needs Assistance Bed Mobility: Supine to Sit, Sit to Supine     Supine to sit: Mod assist Sit to supine: Mod assist   General bed mobility comments: Pt  initially resistive to mobility, required mac physical prmopts with increased time and multimodal cues.  Assist for mgt of trunk and LEs.    Transfers Overall transfer level: Needs assistance Equipment used: Rolling walker (2 wheels) Transfers: Sit to/from Stand Sit to Stand: Mod assist           General transfer comment: able to side step along EOB      Balance Overall balance assessment: Needs assistance   Sitting balance-Leahy Scale: Fair     Standing balance support: Bilateral upper extremity supported, Reliant on assistive device for balance Standing balance-Leahy Scale: Poor                             ADL either performed or assessed with clinical judgement   ADL Overall ADL's : Needs assistance/impaired     Grooming: Wash/dry hands;Wash/dry face;Minimal assistance;Sitting   Upper Body Bathing: Maximal assistance   Lower Body Bathing: Total assistance   Upper Body Dressing : Maximal assistance   Lower Body Dressing: Total assistance   Toilet Transfer: Moderate assistance;Stand-pivot;Cueing for safety;Cueing for sequencing;Rolling walker (2 wheels)   Toileting- Clothing Manipulation and Hygiene: Total assistance;Bed level       Functional mobility during ADLs: Moderate assistance;Cueing for safety;Cueing for sequencing;Rolling walker (2 wheels)       Vision Baseline Vision/History:  (unknown at this time) Ability to See in Adequate Light: 0 Adequate       Perception         Praxis         Pertinent Vitals/Pain Pain Assessment Pain Assessment: Faces  Faces Pain Scale: Hurts a little bit Pain Location: during generalized mobility Pain Intervention(s): Monitored during session, Repositioned     Extremity/Trunk Assessment Upper Extremity Assessment Upper Extremity Assessment: Generalized weakness   Lower Extremity Assessment Lower Extremity Assessment: Defer to PT evaluation   Cervical / Trunk Assessment Cervical / Trunk  Assessment: Kyphotic   Communication Communication Communication: Hearing impairment Cueing Techniques: Verbal cues;Gestural cues;Tactile cues   Cognition Arousal: Alert Behavior During Therapy: Restless Overall Cognitive Status: No family/caregiver present to determine baseline cognitive functioning                                       General Comments       Exercises     Shoulder Instructions      Home Living Family/patient expects to be discharged to:: Assisted living                                        Prior Functioning/Environment Prior Level of Function : Patient poor historian/Family not available             Mobility Comments: per chart review, ambulatory at ALF ADLs Comments: pt unable to provide PLOF ADL info, per chart pt requires assist        OT Problem List: Decreased strength;Impaired balance (sitting and/or standing);Decreased cognition;Decreased safety awareness;Decreased activity tolerance;Decreased knowledge of use of DME or AE;Decreased coordination      OT Treatment/Interventions: Self-care/ADL training;DME and/or AE instruction;Therapeutic activities;Balance training;Therapeutic exercise;Patient/family education    OT Goals(Current goals can be found in the care plan section) Acute Rehab OT Goals Patient Stated Goal: none stated OT Goal Formulation: Patient unable to participate in goal setting Time For Goal Achievement: 04/29/23 Potential to Achieve Goals: Good ADL Goals Pt Will Perform Grooming: with contact guard assist;with supervision;sitting Pt Will Perform Upper Body Bathing: with contact guard assist;with supervision;sitting Pt Will Perform Upper Body Dressing: with contact guard assist;with supervision;sitting Pt Will Transfer to Toilet: with min assist;stand pivot transfer;ambulating;bedside commode  OT Frequency: Min 1X/week    Co-evaluation              AM-PAC OT "6 Clicks" Daily  Activity     Outcome Measure Help from another person eating meals?: A Little Help from another person taking care of personal grooming?: A Little Help from another person toileting, which includes using toliet, bedpan, or urinal?: Total Help from another person bathing (including washing, rinsing, drying)?: Total Help from another person to put on and taking off regular upper body clothing?: A Lot Help from another person to put on and taking off regular lower body clothing?: Total 6 Click Score: 11   End of Session Equipment Utilized During Treatment: Gait belt;Rolling walker (2 wheels)  Activity Tolerance: Patient limited by fatigue;Other (comment) (confusion) Patient left: in bed;with call bell/phone within reach;with bed alarm set  OT Visit Diagnosis: Unsteadiness on feet (R26.81);Other abnormalities of gait and mobility (R26.89);Muscle weakness (generalized) (M62.81)                Time: 1610-9604 OT Time Calculation (min): 27 min Charges:  OT Evaluation $OT Eval Moderate Complexity: 1 Mod OT Treatments $Therapeutic Activity: 8-22 mins    Galen Manila 04/15/2023, 11:38 AM

## 2023-04-16 DIAGNOSIS — J9601 Acute respiratory failure with hypoxia: Secondary | ICD-10-CM | POA: Diagnosis not present

## 2023-04-16 LAB — CBC WITH DIFFERENTIAL/PLATELET
Abs Immature Granulocytes: 0.27 10*3/uL — ABNORMAL HIGH (ref 0.00–0.07)
Basophils Absolute: 0.1 10*3/uL (ref 0.0–0.1)
Basophils Relative: 1 %
Eosinophils Absolute: 0 10*3/uL (ref 0.0–0.5)
Eosinophils Relative: 0 %
HCT: 42.7 % (ref 36.0–46.0)
Hemoglobin: 13.4 g/dL (ref 12.0–15.0)
Immature Granulocytes: 3 %
Lymphocytes Relative: 10 %
Lymphs Abs: 1.1 10*3/uL (ref 0.7–4.0)
MCH: 27.9 pg (ref 26.0–34.0)
MCHC: 31.4 g/dL (ref 30.0–36.0)
MCV: 89 fL (ref 80.0–100.0)
Monocytes Absolute: 0.7 10*3/uL (ref 0.1–1.0)
Monocytes Relative: 7 %
Neutro Abs: 8.7 10*3/uL — ABNORMAL HIGH (ref 1.7–7.7)
Neutrophils Relative %: 79 %
Platelets: 331 10*3/uL (ref 150–400)
RBC: 4.8 MIL/uL (ref 3.87–5.11)
RDW: 13.6 % (ref 11.5–15.5)
WBC: 10.9 10*3/uL — ABNORMAL HIGH (ref 4.0–10.5)
nRBC: 0 % (ref 0.0–0.2)

## 2023-04-16 LAB — BASIC METABOLIC PANEL
Anion gap: 11 (ref 5–15)
BUN: 34 mg/dL — ABNORMAL HIGH (ref 8–23)
CO2: 25 mmol/L (ref 22–32)
Calcium: 9.1 mg/dL (ref 8.9–10.3)
Chloride: 101 mmol/L (ref 98–111)
Creatinine, Ser: 0.78 mg/dL (ref 0.44–1.00)
GFR, Estimated: >60 mL/min (ref >60)
Glucose, Bld: 130 mg/dL — ABNORMAL HIGH (ref 70–99)
Potassium: 4 mmol/L (ref 3.5–5.1)
Sodium: 137 mmol/L (ref 135–145)

## 2023-04-16 MED ORDER — STERILE WATER FOR INJECTION IJ SOLN
INTRAMUSCULAR | Status: AC
  Filled 2023-04-16: qty 10

## 2023-04-16 MED ORDER — OLANZAPINE 10 MG IM SOLR
2.5000 mg | Freq: Once | INTRAMUSCULAR | Status: DC | PRN
  Administered 2023-04-16: 2.5 mg via INTRAMUSCULAR
  Filled 2023-04-16 (×2): qty 10

## 2023-04-16 NOTE — Plan of Care (Signed)
  Problem: Respiratory: Goal: Ability to maintain adequate ventilation will improve Outcome: Progressing   Problem: Clinical Measurements: Goal: Ability to maintain clinical measurements within normal limits will improve Outcome: Progressing Goal: Will remain free from infection Outcome: Progressing   Problem: Safety: Goal: Ability to remain free from injury will improve Outcome: Progressing   Problem: Skin Integrity: Goal: Risk for impaired skin integrity will decrease Outcome: Progressing   Problem: Nutrition: Goal: Adequate nutrition will be maintained Outcome: Not Progressing   Problem: Coping: Goal: Level of anxiety will decrease Outcome: Not Progressing

## 2023-04-16 NOTE — TOC Progression Note (Signed)
Transition of Care [facility]) - Progression Note    Patient Details  Name: [name] MRN: [id number] Date of Birth: 1932-10-24  Transition of [facility]) CM/SW Contact  Otelia Santee, LCSW Phone Number: 04/16/2023, 2:20 PM  Clinical Narrative:    Reviewed bed offers with pt's son who has accepted offer for [facility]. Insurance Berkley Harvey has been requested for placement.  Attempted to reach staff at [facility] to discuss pt's discharge plans. Unable to reach anyone at facility. Will attempt again at a later time.    Expected Discharge Plan: Skilled Nursing Facility Barriers to Discharge: Continued Medical Work up, SNF Pending bed offer  Expected Discharge Plan and Services In-house Referral: Clinical Social Work Discharge Planning Services: NA Post Acute Care Choice: Skilled Nursing Facility Living arrangements for the past 2 months: Assisted Living Facility (Memory Care)                 DME Arranged: N/A DME Agency: NA                   Social Determinants of Health (SDOH) Interventions SDOH Screenings   Food Insecurity: Patient Unable To Answer (04/12/2023)  Housing: Patient Unable To Answer (04/12/2023)  Transportation Needs: Patient Unable To Answer (04/12/2023)  Utilities: Patient Unable To Answer (04/12/2023)  Alcohol Screen: Low Risk  (07/22/2020)  Depression (PHQ2-9): Low Risk  (07/22/2020)  Financial Resource Strain: Low Risk  (07/22/2020)  Physical Activity: Sufficiently Active (07/22/2020)  Social Connections: Patient Unable To Answer (04/12/2023)  Stress: No Stress Concern Present (07/22/2020)  Tobacco Use: Low Risk  (04/12/2023)    Readmission Risk Interventions    04/15/2023   12:03 PM  Readmission Risk Prevention Plan  Transportation Screening Complete  PCP or Specialist Appt within 5-7 Days Complete  Home Care Screening Complete  Medication Review (RN CM) Complete

## 2023-04-16 NOTE — Progress Notes (Signed)
Physical Therapy Treatment Patient Details Name: [name] MRN: [id number] DOB: 12-11-1932 Today's Date: 04/16/2023   History of Present Illness 89 yo female admitted with acute respiratory failure, Pna. Recent flu and scabies treatment. Hx of dementia, MVP, spinal stenosis, staph infection,ckd    PT Comments  Max assist for supine to sit, pt sat edge of bed x 8 minutes, sit to stand with mod assist, pt stood ~15 seconds with RW before BLEs began to buckle so she was assisted back to bed.      If plan is discharge home, recommend the following: A lot of help with bathing/dressing/bathroom;Assistance with feeding;Two people to help with walking and/or transfers   Can travel by private vehicle     No  Equipment Recommendations  None recommended by PT    Recommendations for Other Services       Precautions / Restrictions Precautions Precautions: Fall Restrictions Weight Bearing Restrictions Per Provider Order: No     Mobility  Bed Mobility Overal bed mobility: Needs Assistance Bed Mobility: Supine to Sit     Supine to sit: Max assist Sit to supine: Total assist   General bed mobility comments: Max assist to raise trunk and pivot hips to EOB, pt sat edge of bed x 8 minutes with min A for balance    Transfers Overall transfer level: Needs assistance Equipment used: Rolling walker (2 wheels) Transfers: Sit to/from Stand Sit to Stand: Mod assist           General transfer comment: pt able to stand briefly ~15 seconds with RW before BLEs began to buckle so pt was assisted back to bed.    Ambulation/Gait                   Stairs             Wheelchair Mobility     Tilt Bed    Modified Rankin (Stroke Patients Only)       Balance Overall balance assessment: Needs assistance   Sitting balance-Leahy Scale: Poor Sitting balance - Comments: pt intermittently leans anteriorly and required min A to correct balance   Standing balance  support: Bilateral upper extremity supported, Reliant on assistive device for balance Standing balance-Leahy Scale: Poor Standing balance comment: stood for ~15 seconds with RW before BLEs began to buckle                            Cognition Arousal: Alert Behavior During Therapy: WFL for tasks assessed/performed Overall Cognitive Status: No family/caregiver present to determine baseline cognitive functioning                                 General Comments: pt not able to answer orientation question, mumbling unintelligibly, not able to follow commands        Exercises General Exercises - Lower Extremity Long Arc Quad: AAROM, Both, 10 reps, Seated Shoulder Exercises Shoulder Flexion: AAROM, Both, 10 reps, Seated    General Comments        Pertinent Vitals/Pain Pain Assessment Pain Assessment: No/denies pain Faces Pain Scale: No hurt    Home Living                          Prior Function            PT Goals (current goals can now  be found in the care plan section) Acute Rehab PT Goals PT Goal Formulation: Patient unable to participate in goal setting Time For Goal Achievement: 04/28/23 Potential to Achieve Goals: Fair Progress towards PT goals: Progressing toward goals    Frequency    Min 1X/week      PT Plan      Co-evaluation              AM-PAC PT "6 Clicks" Mobility   Outcome Measure  Help needed turning from your back to your side while in a flat bed without using bedrails?: A Lot Help needed moving from lying on your back to sitting on the side of a flat bed without using bedrails?: A Lot Help needed moving to and from a bed to a chair (including a wheelchair)?: Total Help needed standing up from a chair using your arms (e.g., wheelchair or bedside chair)?: A Lot Help needed to walk in hospital room?: Total Help needed climbing 3-5 steps with a railing? : Total 6 Click Score: 9    End of Session  Equipment Utilized During Treatment: Gait belt Activity Tolerance: Patient tolerated treatment well Patient left: in bed;with call bell/phone within reach;with bed alarm set Nurse Communication: Mobility status PT Visit Diagnosis: Difficulty in walking, not elsewhere classified (R26.2)     Time: 6045-4098 PT Time Calculation (min) (ACUTE ONLY): 18 min  Charges:    $Therapeutic Activity: 8-22 mins PT General Charges $$ ACUTE PT VISIT: 1 Visit                     Tamala Ser PT 04/16/2023  Acute Rehabilitation Services  Office [phone]

## 2023-04-16 NOTE — TOC CM/SW Note (Signed)
CMS list of facilities and star ratings provided to pt to review for facility preference.       [facility] [address] [place], [place] [address] [phone] Overall rating ??  Below average  [facility] at [facility] [address] [place], [place] [address] [phone] Overall rating ? Below average  [facility] [address] [place], [place] [address] [phone] Overall rating? Below average  [facility] [address] [place], [place] [address] [phone] Overall rating ? Much below average  [facility] [address] [place], [place] [address] [phone] Overall rating ???? Average  [facility] and [facility] [address] [place], [place] [address] [phone] Overall rating ? Much below average   [facility] [address] [place], [place] [address] [phone] Overall rating ?? Much below average  [employer] [facility] [employer] [address] [place], [place] [address] [phone] Overall rating ??? Average  [facility] [address] [place], [place] [address] [phone] Overall rating ? Much below average  [facility] [address] [place], [place] [address] [phone] Overall rating ?? Much below average  [facility] [address] [place], [place] [address] [phone] Overall rating ??? Above average  [facility] [address] [place], [place] [address] [phone] Overall rating ????? Much above average  [facility] [address] [place], [place] [address] [phone] Overall rating ???? Above average  [facility] [address] [place], [place]  [address] [phone] Overall rating ????? Much above average  [facility] [address] [place], [place] [address] [phone] Overall rating ????  [facility] [address] [place], [place] [address] [phone] Overall rating ???? Much above average  [place] [facility] [address] [place], [place] [address] [phone] Overall rating ????? Much above average  [facility] [address] [place], [place] [address] [phone] Overall rating ? Much below average  [facility] [address] [place], [place] [address] [phone] Overall rating ??? Average  [facility] [address] [place], [place] [address] [phone] Overall rating ????? Much above average  [facility] [address] [place], [place] [address] [phone] Overall rating ??? Average  [facility] [address] [place], [place] [address] [phone] Overall rating ?? Below average  [facility] [address] [place], [place] [address] [phone] Overall rating ??? Average  [facility] and [facility] [address] [place], [place] [address] [phone] Overall rating ????? Much above average  [facility] and [facility] [address] [place], [place] [address] [phone] Overall rating ? Much below average  [facility]	[place] at the [facility] [place], [place] [address] [phone] Overall rating ????? Much above average  [facility] [address] [place], [place] [address] [phone] Overall rating ??? Much below average  [facility] [address] [place], [place] [address] [phone] Overall rating ??? Average  [facility] and  [facility] [address] [place], [place] [address] [phone] Overall rating ??  Below average  [facility] [address] [place], [place] [address] [phone] Overall rating ? Much below average  [facility] [address] [place], [place] [address] [phone] Overall rating ??? Average  [facility] [address] [place], [place] [address] [phone] Overall rating ? Much below average  [facility] [address] [place], [place] [address] [phone] Overall rating ??? Average  [facility] [address] [place], [place] [address] [phone] Overall rating ? Much below average  [facility] [address] [place], [place] [address] [phone] Overall rating ????? Much above average  [facility] [address] [place], [place] [address] [phone] Overall rating ? Much below average  [facility] [address] [place], [place] [address] [phone] Overall rating ???? Above average  [facility] [address] [place], [place] [address] [phone] Overall rating ????? Above average  [facility] [address] [place], [place] [address] [phone] Overall rating ???? Above average  [facility] [address] [place], [place] [address] [phone] Overall rating ????? Much above average  [facility] [address] [place], [place] [address] [phone] Overall rating ? Much below average  [facility] [address] [place], [place] [address] [phone] Overall rating ????? Much above average  [facility] [address] [place], [place] [address] [phone] Overall rating ? Much below  average  [facility] [address] [place], [place] [address] [phone] Overall rating ????? Much above average  [facility] [address] [place], [place] [address] [phone] Overall rating ????? Much above average  [facility] [address] [place], [place] [address] [phone] Overall rating ??? Average  [facility] [address] [place], [place] [address] [phone] Overall rating ??? Average  [facility] [address] [place], [place] [address] [phone] Overall rating ???? Above average  [facility] [address] [place], [place] [address] [phone] Overall rating ?? Below average  [facility] [address] [place], [place] [address] [phone] Overall rating ???? Above average  [facility] and [facility] [address] [place], [place] [address] [phone] Overall rating ?? Below average  [facility] [address] [place], [place] [address] [phone] Overall rating ??

## 2023-04-16 NOTE — Progress Notes (Signed)
PROGRESS NOTE                                                                                                                                                                                                             Patient Demographics:    [name], is a 89 y.o. female, DOB - Dec 26, 1932, ZOX:[id number]  Outpatient Primary MD for the patient is Pcp, No    LOS - 4  Admit date - 04/12/2023    Chief Complaint  Patient presents with   Shortness of Breath       Brief Narrative (HPI from H&P)    [name] is a 89 y.o. female with medical history significant of stage 3b CKD, dementia, hyperlipidemia, hypertension, mild GERD, mitral valve prolapse, spinal stenosis, duodenal PUD, upper GI bleed with ABLA, elevated LFTs, food impaction of the esophagus, aspiration pneumonia, asthma who was diagnosed earlier this month with influenza and treated with Tamiflu at her SNF and was found hypoxic on morning of admission day prompting an EMS call. Patient was hypoxic with an O2 sat of 85% when they arrived and was placed on [facility].  Her O2 saturation improved with bronchodilators. She also received 125 mg of methylprednisolone. She was admitted for acute hypoxic respiratory failure secondary to pneumonia and asthma exacerbation.  On 1/28 the patient was saturating 94% on 1L. Continue to wean prednisone and O2.    Subjective:    [name] was resting comfortably in bed, No new complaints. Her breathing is improving, although she continues to complain of occassional cough.  Assessment  & Plan :    Assessment and Plan: Acute hypoxic respiratory failure Community-acquired pneumonia Recent influenza A infection Patient presented after being hypoxic at her SNF in the setting of a recently treated influenza A. She remains on 2 L [place]. Urinary strep pneumo positive, negative MRSA screen. Blood culture positive for staph species in 2  bottles from the same set which is likely a contaminant. She remains afebrile, leukocytosis trending down.  -Continue IV Rocephin and azithromycin -Start Mucinex DM twice daily -Continue supplemental O2, wean as able -WBC has decreased from 13.8 to 10.9 on 04/16/2023.  Asthma exacerbation No significant wheezing on exam. Continues to require supplemental oxygen. -Wean oxygen as possible -Wean prednisone as possible -Continue as needed albuterol nebs  HTN BP elevated with  SBP in the 160s to 170s. -Continue all amlodipine and metoprolol  AKI on CKD 3B Baseline creatinine around 1.6-1.7. AKI resolved with creatinine improving from 2.27 on admission to 1.40 today. -Trend BMP  Lactic acidosis, resolved Lactic acid peaked at 4.2, down to 0.7 this morning.  Hypothyroidism -Continue Synthroid  Dementia -Continue memantine and donepezil  GERD -Continue famotidine  Generalized debility -PT/OT eval        Estimated body mass index is 28.72 kg/m as calculated from the following:   Height as of this encounter: 5\' 2"  (1.575 m).   Weight as of this encounter: 71.2 kg.          Condition -improving slowly  Family Communication  : Discussed plan with son over the phone  Code Status : Full code  Consults  : None  PUD Prophylaxis : Famotidine   Procedures  :     None      Disposition Plan  :  tbd  Status is: Inpatient Remains inpatient appropriate because: Acute hypoxic respiratory failure, pneumonia treatment  DVT Prophylaxis  :    heparin injection 5,000 Units Start: 04/12/23 2200  Lab Results  Component Value Date   PLT 331 04/16/2023   Diet :  Diet Order             Diet regular Fluid consistency: Thin  Diet effective now                    Inpatient Medications  Scheduled Meds:  amLODipine  10 mg Oral Daily   colestipol  1 g Oral Daily   donepezil  10 mg Oral QHS   famotidine  10 mg Oral Daily   heparin  5,000 Units Subcutaneous Q8H    levothyroxine  75 mcg Oral QAC breakfast   memantine  5 mg Oral BID   metoprolol tartrate  25 mg Oral BID   pantoprazole  40 mg Oral Daily   predniSONE  40 mg Oral Q breakfast   Continuous Infusions:   PRN Meds:.acetaminophen **OR** acetaminophen, albuterol, OLANZapine, ondansetron **OR** ondansetron (ZOFRAN) IV  Antibiotics  :    Anti-infectives (From admission, onward)    Start     Dose/Rate Route Frequency Ordered Stop   04/15/23 1700  azithromycin (ZITHROMAX) tablet 250 mg        250 mg Oral Every 24 hours 04/15/23 1102 04/16/23 1605   04/12/23 1600  cefTRIAXone (ROCEPHIN) 1 g in sodium chloride 0.9 % 100 mL IVPB        1 g 200 mL/hr over 30 Minutes Intravenous Every 24 hours 04/12/23 1534 04/16/23 1540   04/12/23 1600  azithromycin (ZITHROMAX) 500 mg in sodium chloride 0.9 % 250 mL IVPB  Status:  Discontinued        500 mg 250 mL/hr over 60 Minutes Intravenous Every 24 hours 04/12/23 1534 04/15/23 1102         Objective:   Vitals:   04/16/23 0516 04/16/23 1034 04/16/23 1250 04/16/23 1553  BP: (!) 191/94 (!) 172/83 (!) 176/91 (!) 155/141  Pulse: 69 67 67 69  Resp: 18  16   Temp: 97.6 F (36.4 C) 98 F (36.7 C) 97.6 F (36.4 C) 98 F (36.7 C)  TempSrc: Axillary Oral    SpO2: 97% 94%  91%  Weight:      Height:        Wt Readings from Last 3 Encounters:  04/12/23 71.2 kg  10/25/21 71.2 kg  09/15/21 73 kg  Intake/Output Summary (Last 24 hours) at 04/16/2023 1749 Last data filed at 04/16/2023 0517 Gross per 24 hour  Intake --  Output 325 ml  Net -325 ml   Exam:  Constitutional:  The patient is awake, alert, and oriented x 3. She continues to be dyspneic with conversation. Respiratory:  No increaed work of breathing. No rales, or rhonchi Positive for wheezes throughout No tactile fremitus Cardiovascular:  Regular rate and rhythm No murmurs, ectopy, or gallups. No lateral PMI. No thrills. Abdomen:  Abdomen is soft, non-tender, non-distended No  hernias, masses, or organomegaly Normoactive bowel sounds.  Musculoskeletal:  No cyanosis, clubbing, or edema Skin:  No rashes, lesions, ulcers palpation of skin: no induration or nodules Neurologic:  CN 2-12 intact Sensation all 4 extremities intact   RN pressure injury documentation: Pressure Injury 08/20/21 Buttocks Mid Stage II -  Partial thickness loss of dermis presenting as a shallow open ulcer with a red, pink wound bed without slough. (Active)  08/20/21 1948  Location: Buttocks  Location Orientation: Mid  Staging: Stage II -  Partial thickness loss of dermis presenting as a shallow open ulcer with a red, pink wound bed without slough.  Wound Description (Comments):   Present on Admission: Yes     Pressure Injury 08/20/21 Hip Anterior;Right Stage 2 -  Partial thickness loss of dermis presenting as a shallow open injury with a red, pink wound bed without slough. Long pressure wound to hip area (Active)  08/20/21 2159  Location: Hip  Location Orientation: Anterior;Right  Staging: Stage 2 -  Partial thickness loss of dermis presenting as a shallow open injury with a red, pink wound bed without slough.  Wound Description (Comments): Long pressure wound to hip area  Present on Admission: -- (Unknown)     Pressure Injury 04/12/23 Buttocks Right Stage 2 -  Partial thickness loss of dermis presenting as a shallow open injury with a red, pink wound bed without slough. (Active)  04/12/23 1729  Location: Buttocks  Location Orientation: Right  Staging: Stage 2 -  Partial thickness loss of dermis presenting as a shallow open injury with a red, pink wound bed without slough.  Wound Description (Comments):   Present on Admission: Yes  Dressing Type Foam - Lift dressing to assess site every shift 04/15/23 2100      Data Review:    Recent Labs  Lab 04/12/23 1232 04/13/23 0708 04/14/23 0612 04/15/23 0507 04/16/23 1026  WBC 13.7* 11.9* 13.8* 10.6* 10.9*  HGB 12.5 11.6* 12.1 13.0  13.4  HCT 40.2 36.8 39.3 41.2 42.7  PLT 294 249 306 305 331  MCV 90.7 89.1 91.6 89.4 89.0  MCH 28.2 28.1 28.2 28.2 27.9  MCHC 31.1 31.5 30.8 31.6 31.4  RDW 14.2 14.2 14.3 13.9 13.6  LYMPHSABS 2.6  --   --  2.0 1.1  MONOABS 1.4*  --   --  1.0 0.7  EOSABS 0.0  --   --  0.0 0.0  BASOSABS 0.0  --   --  0.0 0.1    Recent Labs  Lab 04/12/23 1232 04/12/23 1242 04/12/23 1427 04/12/23 1825 04/12/23 2312 04/13/23 0708 04/14/23 0612 04/15/23 0507 04/16/23 1026  NA 138  --   --   --   --  141 144 141 137  K 4.0  --   --   --   --  4.1 4.6 4.5 4.0  CL 100  --   --   --   --  108 110 104  101  CO2 23  --   --   --   --  21* 25 27 25   ANIONGAP 15  --   --   --   --  12 9 10 11   GLUCOSE 128*  --   --   --   --  117* 102* 94 130*  BUN 41*  --   --   --   --  33* 37* 37* 34*  CREATININE 2.27*  --   --   --   --  1.42* 1.26* 1.40* 0.78  AST 27  --   --   --   --  19  --   --   --   ALT 20  --   --   --   --  18  --   --   --   ALKPHOS 94  --   --   --   --  78  --   --   --   BILITOT 0.5  --   --   --   --  0.5  --   --   --   ALBUMIN 3.8  --   --   --   --  3.0*  --   --   --   PROCALCITON  --   --   --  0.43  --  0.44  --   --   --   LATICACIDVEN  --  2.0* 2.7* 4.2* 2.7* 0.7  --   --   --   INR 1.1  --   --   --   --   --   --   --   --   BNP 95.5  --   --   --   --   --   --   --   --   MG  --   --   --  2.1  --   --   --   --   --   CALCIUM 9.2  --   --   --   --  8.5* 9.2 9.4 9.1      Recent Labs  Lab 04/12/23 1232 04/12/23 1242 04/12/23 1427 04/12/23 1825 04/12/23 2312 04/13/23 0708 04/14/23 0612 04/15/23 0507 04/16/23 1026  PROCALCITON  --   --   --  0.43  --  0.44  --   --   --   LATICACIDVEN  --  2.0* 2.7* 4.2* 2.7* 0.7  --   --   --   INR 1.1  --   --   --   --   --   --   --   --   BNP 95.5  --   --   --   --   --   --   --   --   MG  --   --   --  2.1  --   --   --   --   --   CALCIUM 9.2  --   --   --   --  8.5* 9.2 9.4 9.1     --------------------------------------------------------------------------------------------------------------- Lab Results  Component Value Date   CHOL 149 05/23/2020   HDL 65 05/23/2020   LDLCALC 66 05/23/2020   TRIG 95 05/23/2020   CHOLHDL 2.3 05/23/2020    Lab Results  Component Value Date   HGBA1C 5.6 09/04/2015   No results for input(s): "TSH", "T4TOTAL", "FREET4", "T3FREE", "THYROIDAB" in the last  72 hours. No results for input(s): "VITAMINB12", "FOLATE", "FERRITIN", "TIBC", "IRON", "RETICCTPCT" in the last 72 hours. ------------------------------------------------------------------------------------------------------------------ Cardiac Enzymes No results for input(s): "CKMB", "TROPONINI", "MYOGLOBIN" in the last 168 hours.  Invalid input(s): "CK"  Micro Results Recent Results (from the past 240 hours)  Resp panel by RT-PCR (RSV, Flu A&B, Covid)     Status: Abnormal   Collection Time: 04/12/23 12:14 PM   Specimen: Nasal Swab  Result Value Ref Range Status   SARS Coronavirus 2 by RT PCR NEGATIVE NEGATIVE Final    Comment: (NOTE) SARS-CoV-2 target nucleic acids are NOT DETECTED.  The SARS-CoV-2 RNA is generally detectable in upper respiratory specimens during the acute phase of infection. The lowest concentration of SARS-CoV-2 viral copies this assay can detect is 138 copies/mL. A negative result does not preclude SARS-Cov-2 infection and should not be used as the sole basis for treatment or other patient management decisions. A negative result may occur with  improper specimen collection/handling, submission of specimen other than nasopharyngeal swab, presence of viral mutation(s) within the areas targeted by this assay, and inadequate number of viral copies(<138 copies/mL). A negative result must be combined with clinical observations, patient history, and epidemiological information. The expected result is Negative.  Fact Sheet for Patients:   BloggerCourse.com  Fact Sheet for Healthcare Providers:  SeriousBroker.it  This test is no t yet approved or cleared by the [place] FDA and  has been authorized for detection and/or diagnosis of SARS-CoV-2 by FDA under an Emergency Use Authorization (EUA). This EUA will remain  in effect (meaning this test can be used) for the duration of the COVID-19 declaration under Section 564(b)(1) of the Act, 21 U.S.C.section 360bbb-3(b)(1), unless the authorization is terminated  or revoked sooner.       Influenza A by PCR POSITIVE (A) NEGATIVE Final   Influenza B by PCR NEGATIVE NEGATIVE Final    Comment: (NOTE) The Xpert Xpress SARS-CoV-2/FLU/RSV plus assay is intended as an aid in the diagnosis of influenza from Nasopharyngeal swab specimens and should not be used as a sole basis for treatment. Nasal washings and aspirates are unacceptable for Xpert Xpress SARS-CoV-2/FLU/RSV testing.  Fact Sheet for Patients: BloggerCourse.com  Fact Sheet for Healthcare Providers: SeriousBroker.it  This test is not yet approved or cleared by the [place] FDA and has been authorized for detection and/or diagnosis of SARS-CoV-2 by FDA under an Emergency Use Authorization (EUA). This EUA will remain in effect (meaning this test can be used) for the duration of the COVID-19 declaration under Section 564(b)(1) of the Act, 21 U.S.C. section 360bbb-3(b)(1), unless the authorization is terminated or revoked.     Resp Syncytial Virus by PCR NEGATIVE NEGATIVE Final    Comment: (NOTE) Fact Sheet for Patients: BloggerCourse.com  Fact Sheet for Healthcare Providers: SeriousBroker.it  This test is not yet approved or cleared by the [place] FDA and has been authorized for detection and/or diagnosis of SARS-CoV-2 by FDA under an Emergency Use  Authorization (EUA). This EUA will remain in effect (meaning this test can be used) for the duration of the COVID-19 declaration under Section 564(b)(1) of the Act, 21 U.S.C. section 360bbb-3(b)(1), unless the authorization is terminated or revoked.  Performed at [facility], [address] [place], [place] [address]   Blood Culture (routine x 2)     Status: Abnormal   Collection Time: 04/12/23 12:31 PM   Specimen: BLOOD  Result Value Ref Range Status   Specimen Description   Final  BLOOD RIGHT ANTECUBITAL Performed at [facility], [address], [place], [place] [address]    Special Requests   Final    BOTTLES DRAWN AEROBIC AND ANAEROBIC Blood Culture adequate volume Performed at [facility], [address], [place], [place] [address]    Culture  Setup Time   Final    GRAM POSITIVE COCCI IN CLUSTERS IN BOTH AEROBIC AND ANAEROBIC BOTTLES CRITICAL RESULT CALLED TO, READ BACK BY AND VERIFIED WITH: PHARMD ABBY ELLINGTON [phone] 1009 BY J RAZZAK, [place]    Culture (A)  Final    STAPHYLOCOCCUS HOMINIS STAPHYLOCOCCUS EPIDERMIDIS THE SIGNIFICANCE OF ISOLATING THIS ORGANISM FROM A SINGLE SET OF BLOOD CULTURES WHEN MULTIPLE SETS ARE DRAWN IS UNCERTAIN. PLEASE NOTIFY THE MICROBIOLOGY DEPARTMENT WITHIN ONE WEEK IF SPECIATION AND SENSITIVITIES ARE REQUIRED. Performed at [facility], [address], [place], [place] [address]    Report Status 04/15/2023 FINAL  Final  Blood Culture ID Panel (Reflexed)     Status: Abnormal   Collection Time: 04/12/23 12:31 PM  Result Value Ref Range Status   Enterococcus faecalis NOT DETECTED NOT DETECTED Final   Enterococcus Faecium NOT DETECTED NOT DETECTED Final   Listeria monocytogenes NOT DETECTED NOT DETECTED Final   Staphylococcus species DETECTED (A) NOT DETECTED Final    Comment: CRITICAL RESULT CALLED TO, READ BACK BY AND VERIFIED WITH: PHARMD ABBY ELLINGTON [phone] 1009 BY J  RAZZAK, MT    Staphylococcus aureus (BCID) NOT DETECTED NOT DETECTED Final   Staphylococcus epidermidis NOT DETECTED NOT DETECTED Final   Staphylococcus lugdunensis NOT DETECTED NOT DETECTED Final   Streptococcus species NOT DETECTED NOT DETECTED Final   Streptococcus agalactiae NOT DETECTED NOT DETECTED Final   Streptococcus pneumoniae NOT DETECTED NOT DETECTED Final   Streptococcus pyogenes NOT DETECTED NOT DETECTED Final   A.calcoaceticus-baumannii NOT DETECTED NOT DETECTED Final   Bacteroides fragilis NOT DETECTED NOT DETECTED Final   Enterobacterales NOT DETECTED NOT DETECTED Final   Enterobacter cloacae complex NOT DETECTED NOT DETECTED Final   Escherichia coli NOT DETECTED NOT DETECTED Final   Klebsiella aerogenes NOT DETECTED NOT DETECTED Final   Klebsiella oxytoca NOT DETECTED NOT DETECTED Final   Klebsiella pneumoniae NOT DETECTED NOT DETECTED Final   Proteus species NOT DETECTED NOT DETECTED Final   Salmonella species NOT DETECTED NOT DETECTED Final   Serratia marcescens NOT DETECTED NOT DETECTED Final   Haemophilus influenzae NOT DETECTED NOT DETECTED Final   Neisseria meningitidis NOT DETECTED NOT DETECTED Final   Pseudomonas aeruginosa NOT DETECTED NOT DETECTED Final   Stenotrophomonas maltophilia NOT DETECTED NOT DETECTED Final   Candida albicans NOT DETECTED NOT DETECTED Final   Candida auris NOT DETECTED NOT DETECTED Final   Candida glabrata NOT DETECTED NOT DETECTED Final   Candida krusei NOT DETECTED NOT DETECTED Final   Candida parapsilosis NOT DETECTED NOT DETECTED Final   Candida tropicalis NOT DETECTED NOT DETECTED Final   Cryptococcus neoformans/gattii NOT DETECTED NOT DETECTED Final    Comment: Performed at [facility], [address], [place], [place] [address]  Blood Culture (routine x 2)     Status: None (Preliminary result)   Collection Time: 04/12/23  1:10 PM   Specimen: BLOOD RIGHT HAND  Result Value Ref Range Status   Specimen Description    Final    BLOOD RIGHT HAND BOTTLES DRAWN AEROBIC AND ANAEROBIC Performed at [facility], [address] [place], [place] [address]    Special Requests   Final  Blood Culture results may not be optimal due to an inadequate volume of blood received in culture bottles Performed at [facility], [address], [place], [place] [address]    Culture   Final    NO GROWTH 4 DAYS Performed at [facility], [address] [place], [place] [address]    Report Status PENDING  Incomplete  MRSA Next Gen by PCR, Nasal     Status: None   Collection Time: 04/13/23  1:51 AM   Specimen: Nasal Mucosa; Nasal Swab  Result Value Ref Range Status   MRSA by PCR Next Gen NOT DETECTED NOT DETECTED Final    Comment: (NOTE) The GeneXpert MRSA Assay (FDA approved for NASAL specimens only), is one component of a comprehensive MRSA colonization surveillance program. It is not intended to diagnose MRSA infection nor to guide or monitor treatment for MRSA infections. Test performance is not FDA approved in patients less than 6 years old. Performed at [facility], [address], [place], [place] [address]     Radiology Reports No results found.     Signature  -   Fran Lowes M.D on 04/16/2023 at 5:49 PM   -  To page go to [email or web address]

## 2023-04-17 DIAGNOSIS — J9601 Acute respiratory failure with hypoxia: Secondary | ICD-10-CM | POA: Diagnosis not present

## 2023-04-17 LAB — CBC WITH DIFFERENTIAL/PLATELET
Abs Immature Granulocytes: 0.61 10*3/uL — ABNORMAL HIGH (ref 0.00–0.07)
Basophils Absolute: 0 10*3/uL (ref 0.0–0.1)
Basophils Relative: 0 %
Eosinophils Absolute: 0 10*3/uL (ref 0.0–0.5)
Eosinophils Relative: 0 %
HCT: 44.7 % (ref 36.0–46.0)
Hemoglobin: 13.9 g/dL (ref 12.0–15.0)
Immature Granulocytes: 6 %
Lymphocytes Relative: 26 %
Lymphs Abs: 2.8 10*3/uL (ref 0.7–4.0)
MCH: 27.9 pg (ref 26.0–34.0)
MCHC: 31.1 g/dL (ref 30.0–36.0)
MCV: 89.6 fL (ref 80.0–100.0)
Monocytes Absolute: 1 10*3/uL (ref 0.1–1.0)
Monocytes Relative: 9 %
Neutro Abs: 6.6 10*3/uL (ref 1.7–7.7)
Neutrophils Relative %: 59 %
Platelets: 359 10*3/uL (ref 150–400)
RBC: 4.99 MIL/uL (ref 3.87–5.11)
RDW: 13.6 % (ref 11.5–15.5)
WBC: 11.1 10*3/uL — ABNORMAL HIGH (ref 4.0–10.5)
nRBC: 0 % (ref 0.0–0.2)

## 2023-04-17 LAB — BASIC METABOLIC PANEL
Anion gap: 17 — ABNORMAL HIGH (ref 5–15)
BUN: 34 mg/dL — ABNORMAL HIGH (ref 8–23)
CO2: 21 mmol/L — ABNORMAL LOW (ref 22–32)
Calcium: 9.4 mg/dL (ref 8.9–10.3)
Chloride: 104 mmol/L (ref 98–111)
Creatinine, Ser: 0.91 mg/dL (ref 0.44–1.00)
GFR, Estimated: >60 mL/min (ref >60)
Glucose, Bld: 89 mg/dL (ref 70–99)
Potassium: 4.4 mmol/L (ref 3.5–5.1)
Sodium: 142 mmol/L (ref 135–145)

## 2023-04-17 LAB — CULTURE, BLOOD (ROUTINE X 2): Culture: NO GROWTH

## 2023-04-17 MED ORDER — MEMANTINE HCL 5 MG PO TABS: 5.0000 mg | ORAL_TABLET | Freq: Two times a day (BID) | ORAL | 0 refills | Status: AC

## 2023-04-17 MED ORDER — AZITHROMYCIN 500 MG PO TABS: 500.0000 mg | ORAL_TABLET | Freq: Every day | ORAL | 0 refills | Status: AC

## 2023-04-17 MED ORDER — AMOXICILLIN-POT CLAVULANATE 875-125 MG PO TABS: 1.0000 | ORAL_TABLET | Freq: Two times a day (BID) | ORAL | 0 refills | Status: AC

## 2023-04-17 MED ORDER — ALBUTEROL SULFATE (2.5 MG/3ML) 0.083% IN NEBU: 2.5000 mg | INHALATION_SOLUTION | RESPIRATORY_TRACT | 12 refills | Status: AC | PRN

## 2023-04-17 NOTE — Progress Notes (Signed)
Report called to nurse Flo at the facility. All of the nurses' questions and concerns were addressed to their satisfaction. Pending [facility] transport at this time. Will continue to monitor.

## 2023-04-17 NOTE — Discharge Summary (Addendum)
Physician Discharge Summary   Patient: [name] MRN: [id number] DOB: 1932-04-11  Admit date:     04/12/2023  Discharge date: 04/17/23  Discharge Physician: Fran Lowes   PCP: Pcp, No   Recommendations at discharge:    The patient will be discharged back to her skilled nursing facility. She will be discharged on 1L O2 by nasal cannula. PT/OT to eval and treat. She will need to follow up with PCP in 7-10 days. Reposition patient frequently. Mepilex to right   Discharge Diagnoses: Principal Problem:   Acute respiratory failure with hypoxia (HCC) Active Problems:   GERD   CKD stage 3b, GFR 30-44 ml/min (HCC)   Asthma exacerbation   Dementia (HCC)   AKI (acute kidney injury) (HCC)   Lactic acidosis   Influenza A   Community acquired pneumonia   Pressure injury of skin  Resolved Problems:   * No resolved hospital problems. [facility] Course: The patient was admitted and was given supportive care for Asthma exacerbation due to influenza A infection. She received nebulizer treatments and supplemental O2. She had a complete course of Tamiflu prior to presentation. She has also received IV Ceftriaxone and azithromycin. The patient has remained pleasantly confused throughout her stay, although she did become very agitated and confused at one point and did have one dose of zyprexa. Today she is saturating in the 90's on 1L O2 by nasal cannula. She is appropriate to return to the skilled nursing facility. She will be continued on an oral course of antibiotics for her community acquired pneumonia.  Assessment and Plan: Acute hypoxic respiratory failure Community-acquired pneumonia Recent influenza A infection Patient presented after being hypoxic at her SNF in the setting of a recently treated influenza A. She remains on 2 L [place]. Urinary strep pneumo positive, negative MRSA screen. Blood culture positive for staph species in 2 bottles from the same set which is likely a contaminant.  She remains afebrile, leukocytosis trending down.  -Continue IV Rocephin and azithromycin -Start Mucinex DM twice daily -Continue supplemental O2, wean as able -WBC has decreased from 13.8 to 10.9 on 04/16/2023 -The patient has been converted to oral augmentin to complete course of antibiotics for pneumonia. -She will be continued on 1LO2 by nasal cannula on discharge as she continues to require this to maintain a saturation in the 90's.   Asthma exacerbation No significant wheezing on exam. Continues to require supplemental oxygen. -Wean oxygen as possible -Wean prednisone as possible -Continue as needed albuterol nebs   HTN BP elevated with SBP in the 160s to 170s. -Continue all amlodipine and metoprolol   AKI on CKD 3B Resolved. Baseline creatinine around 1.6-1.7. AKI resolved with creatinine improving from 2.27 on admission to 1.40 today. -Trend BMP   Lactic acidosis, resolved Lactic acid peaked at 4.2, down to 0.7 this morning.   Hypothyroidism -Continue Synthroid   Dementia -Continue memantine and donepezil   GERD -Continue famotidine   Generalized debility -PT/OT eval   Estimated body mass index is 28.72 kg/m as calculated from the following:   Height as of this encounter: 5\' 2"  (1.575 m).   Weight as of this encounter: 71.2 kg.         Consultants: None Procedures performed: None  Disposition: Skilled nursing facility Diet recommendation:  Discharge Diet Orders (From admission, onward)     Start     Ordered   04/17/23 0000  Diet - low sodium heart healthy        04/17/23  1337           Regular diet DISCHARGE MEDICATION: Allergies as of 04/17/2023       Reactions   Tape Rash, Other (See Comments)   SKIN IS EXTREMELY SENSITIVE!!! IT WILL BREAK OUT, BRUISE, AND TEAR VERY EASILY!!   Demerol [meperidine] Other (See Comments)   Altered mental status- "allergic," per [facility]        Medication List     STOP taking these medications     nystatin powder Commonly known as: nystatin   oseltamivir 30 MG capsule Commonly known as: Tamiflu       TAKE these medications    acetaminophen 500 MG tablet Commonly known as: TYLENOL Take 1,000 mg by mouth 3 (three) times daily.   albuterol (2.5 MG/3ML) 0.083% nebulizer solution Commonly known as: PROVENTIL Take 3 mLs (2.5 mg total) by nebulization every 4 (four) hours as needed for wheezing or shortness of breath.   amLODipine 10 MG tablet Commonly known as: NORVASC TAKE 1 TABLET BY MOUTH EVERY DAY   amoxicillin-clavulanate 875-125 MG tablet Commonly known as: AUGMENTIN Take 1 tablet by mouth 2 (two) times daily for 5 days.   azithromycin 500 MG tablet Commonly known as: Zithromax Take 1 tablet (500 mg total) by mouth daily for 3 days.   Baza Antifungal 2 % cream Generic drug: miconazole Apply 1 Application topically See admin instructions. Apply to the buttocks 3 times a day   Colestid 1 g tablet Generic drug: colestipol Take 1 g by mouth in the morning.   docusate sodium 100 MG capsule Commonly known as: COLACE Take 100 mg by mouth every 12 (twelve) hours as needed (for constipation).   donepezil 10 MG tablet Commonly known as: ARICEPT TAKE 1 TABLET BY MOUTH EVERYDAY AT BEDTIME What changed: See the new instructions.   famotidine 20 MG tablet Commonly known as: PEPCID Take 20 mg by mouth daily before breakfast.   levothyroxine 75 MCG tablet Commonly known as: SYNTHROID Take 75 mcg by mouth daily before breakfast.   memantine 5 MG tablet Commonly known as: NAMENDA Take 1 tablet (5 mg total) by mouth 2 (two) times daily. What changed:  medication strength how much to take   metoprolol tartrate 25 MG tablet Commonly known as: LOPRESSOR TAKE 1 TABLET BY MOUTH TWICE A DAY What changed: when to take this   NON FORMULARY Take 4 fluid ounces by mouth See admin instructions. Magic Cup- Eat 4 fluid ounces (1 cup) by mouth three times a day with  meals   pantoprazole 40 MG tablet Commonly known as: PROTONIX TAKE 1 TABLET BY MOUTH EVERY DAY               Discharge Care Instructions  (From admission, onward)           Start     Ordered   04/17/23 0000  Discharge wound care:       Comments: Stage 2 decubitus ulcer to right hip.   04/17/23 1337   04/17/23 0000  Discharge wound care:       Comments: Reposition patient frequently. Mepilex to the right hip to be changed if soiled or every other day.   04/17/23 1340            Contact information for after-discharge care     Destination     [facility] .   Service: Skilled Nursing Contact information: [address] [place] [address] [phone]  Discharge Exam: Filed Weights   04/12/23 1210  Weight: 71.2 kg   Exam:  Constitutional:  The patient is awake, alert, and oriented x 1. No acute distress. Pleasantly confused. Respiratory:  No increased work of breathing. No wheezes, rales, or rhonchi No tactile fremitus Cardiovascular:  Regular rate and rhythm No murmurs, ectopy, or gallups. No lateral PMI. No thrills. Abdomen:  Abdomen is soft, non-tender, non-distended No hernias, masses, or organomegaly Normoactive bowel sounds.  Musculoskeletal:  No cyanosis, clubbing, or edema Skin:  No rashes, lesions, ulcers palpation of skin: no induration or nodules Neurologic:  CN 2-12 intact Sensation all 4 extremities intact Psychiatric:  Mental status Mood, affect appropriate Orientation to person, place, time  judgment and insight appear intact   Condition at discharge: fair  The results of significant diagnostics from this hospitalization (including imaging, microbiology, ancillary and laboratory) are listed below for reference.   Imaging Studies: CT Chest Wo Contrast Result Date: 04/12/2023 CLINICAL DATA:  Pneumonia, complications suspected EXAM: CT CHEST WITHOUT  CONTRAST TECHNIQUE: Multidetector CT imaging of the chest was performed following the standard protocol without IV contrast. RADIATION DOSE REDUCTION: This exam was performed according to the departmental dose-optimization program which includes automated exposure control, adjustment of the mA and/or kV according to patient size and/or use of iterative reconstruction technique. COMPARISON:  CT 02/10/2020 and previous FINDINGS: Cardiovascular: Heart size normal. No pericardial effusion. Mitral annulus calcifications. 3-vessel coronary calcifications. Aortic Atherosclerosis (ICD10-170.0). Mediastinum/Nodes: No mass or adenopathy. Lungs/Pleura: No pleural effusion. No pneumothorax. Subpleural subcentimeter calcified granuloma in the left lung apex. Subsegmental atelectasis or scarring extending from the hilum to the pleural surface in the lateral basal segment right lower lobe. There is some linear subsegmental atelectasis in both lung bases. Fluid/debris in lateral lobar airways in the right lower lobe. Upper Abdomen: Layering hyperdense material in the nondilated gallbladder. 7.7 cm 13 HU exophytic left renal lesion cyst incompletely visualized, better characterized on prior CT; no follow-up recommended. Musculoskeletal: Thoracolumbar dextroscoliosis apex L1 with vertebral endplate spurring at multiple levels in the mid and lower thoracic spine. IMPRESSION: 1. No acute findings. 2. Subsegmental atelectasis or scarring lateral basal segment right lower lobe. 3. Coronary and   Aortic Atherosclerosis (ICD10-I70.0). Electronically Signed   By: Corlis Leak M.D.   On: 04/12/2023 16:07   DG Chest Port 1 View Result Date: 04/12/2023 CLINICAL DATA:  Questionable sepsis EXAM: PORTABLE CHEST 1 VIEW COMPARISON:  04/02/2022 FINDINGS: Patient rotated to the right. Wire, lead, and clothing artifact projects over the chest. A presumably artifactual density projects over the medial right lower hemithorax. Normal heart size.  Tortuous thoracic aorta. No pleural effusion or pneumothorax. Clear lungs. IMPRESSION: Artifact and position degraded exam. Given this limitation, no evidence of pneumonia. Electronically Signed   By: Jeronimo Greaves M.D.   On: 04/12/2023 13:47    Microbiology: Results for orders placed or performed during the hospital encounter of 04/12/23  Resp panel by RT-PCR (RSV, Flu A&B, Covid)     Status: Abnormal   Collection Time: 04/12/23 12:14 PM   Specimen: Nasal Swab  Result Value Ref Range Status   SARS Coronavirus 2 by RT PCR NEGATIVE NEGATIVE Final    Comment: (NOTE) SARS-CoV-2 target nucleic acids are NOT DETECTED.  The SARS-CoV-2 RNA is generally detectable in upper respiratory specimens during the acute phase of infection. The lowest concentration of SARS-CoV-2 viral copies this assay can detect is 138 copies/mL. A negative result does not preclude SARS-Cov-2 infection and should not be used  as the sole basis for treatment or other patient management decisions. A negative result may occur with  improper specimen collection/handling, submission of specimen other than nasopharyngeal swab, presence of viral mutation(s) within the areas targeted by this assay, and inadequate number of viral copies(<138 copies/mL). A negative result must be combined with clinical observations, patient history, and epidemiological information. The expected result is Negative.  Fact Sheet for Patients:  BloggerCourse.com  Fact Sheet for Healthcare Providers:  SeriousBroker.it  This test is no t yet approved or cleared by the [place] FDA and  has been authorized for detection and/or diagnosis of SARS-CoV-2 by FDA under an Emergency Use Authorization (EUA). This EUA will remain  in effect (meaning this test can be used) for the duration of the COVID-19 declaration under Section 564(b)(1) of the Act, 21 U.S.C.section 360bbb-3(b)(1), unless the  authorization is terminated  or revoked sooner.       Influenza A by PCR POSITIVE (A) NEGATIVE Final   Influenza B by PCR NEGATIVE NEGATIVE Final    Comment: (NOTE) The Xpert Xpress SARS-CoV-2/FLU/RSV plus assay is intended as an aid in the diagnosis of influenza from Nasopharyngeal swab specimens and should not be used as a sole basis for treatment. Nasal washings and aspirates are unacceptable for Xpert Xpress SARS-CoV-2/FLU/RSV testing.  Fact Sheet for Patients: BloggerCourse.com  Fact Sheet for Healthcare Providers: SeriousBroker.it  This test is not yet approved or cleared by the [place] FDA and has been authorized for detection and/or diagnosis of SARS-CoV-2 by FDA under an Emergency Use Authorization (EUA). This EUA will remain in effect (meaning this test can be used) for the duration of the COVID-19 declaration under Section 564(b)(1) of the Act, 21 U.S.C. section 360bbb-3(b)(1), unless the authorization is terminated or revoked.     Resp Syncytial Virus by PCR NEGATIVE NEGATIVE Final    Comment: (NOTE) Fact Sheet for Patients: BloggerCourse.com  Fact Sheet for Healthcare Providers: SeriousBroker.it  This test is not yet approved or cleared by the [place] FDA and has been authorized for detection and/or diagnosis of SARS-CoV-2 by FDA under an Emergency Use Authorization (EUA). This EUA will remain in effect (meaning this test can be used) for the duration of the COVID-19 declaration under Section 564(b)(1) of the Act, 21 U.S.C. section 360bbb-3(b)(1), unless the authorization is terminated or revoked.  Performed at [facility], [address], [place], [place] [address]   Blood Culture (routine x 2)     Status: Abnormal   Collection Time: 04/12/23 12:31 PM   Specimen: BLOOD  Result Value Ref Range Status   Specimen Description    Final    BLOOD RIGHT ANTECUBITAL Performed at [facility], [address], [place], [place] [address]    Special Requests   Final    BOTTLES DRAWN AEROBIC AND ANAEROBIC Blood Culture adequate volume Performed at [facility], [address] [place], [place] [address]    Culture  Setup Time   Final    GRAM POSITIVE COCCI IN CLUSTERS IN BOTH AEROBIC AND ANAEROBIC BOTTLES CRITICAL RESULT CALLED TO, READ BACK BY AND VERIFIED WITH: PHARMD ABBY ELLINGTON [phone] 1009 BY J RAZZAK, MT    Culture (A)  Final    STAPHYLOCOCCUS HOMINIS STAPHYLOCOCCUS EPIDERMIDIS THE SIGNIFICANCE OF ISOLATING THIS ORGANISM FROM A SINGLE SET OF BLOOD CULTURES WHEN MULTIPLE SETS ARE DRAWN IS UNCERTAIN. PLEASE NOTIFY THE MICROBIOLOGY DEPARTMENT WITHIN ONE WEEK IF SPECIATION AND SENSITIVITIES ARE REQUIRED. Performed at [facility], [address] [place], [place] [address]    Report Status 04/15/2023 FINAL  Final  Blood Culture ID Panel (Reflexed)     Status: Abnormal   Collection Time: 04/12/23 12:31 PM  Result Value Ref Range Status   Enterococcus faecalis NOT DETECTED NOT DETECTED Final   Enterococcus Faecium NOT DETECTED NOT DETECTED Final   Listeria monocytogenes NOT DETECTED NOT DETECTED Final   Staphylococcus species DETECTED (A) NOT DETECTED Final    Comment: CRITICAL RESULT CALLED TO, READ BACK BY AND VERIFIED WITH: PHARMD ABBY ELLINGTON [phone] 1009 BY J RAZZAK, MT    Staphylococcus aureus (BCID) NOT DETECTED NOT DETECTED Final   Staphylococcus epidermidis NOT DETECTED NOT DETECTED Final   Staphylococcus lugdunensis NOT DETECTED NOT DETECTED Final   Streptococcus species NOT DETECTED NOT DETECTED Final   Streptococcus agalactiae NOT DETECTED NOT DETECTED Final   Streptococcus pneumoniae NOT DETECTED NOT DETECTED Final   Streptococcus pyogenes NOT DETECTED NOT DETECTED Final   A.calcoaceticus-baumannii NOT DETECTED NOT DETECTED Final   Bacteroides  fragilis NOT DETECTED NOT DETECTED Final   Enterobacterales NOT DETECTED NOT DETECTED Final   Enterobacter cloacae complex NOT DETECTED NOT DETECTED Final   Escherichia coli NOT DETECTED NOT DETECTED Final   Klebsiella aerogenes NOT DETECTED NOT DETECTED Final   Klebsiella oxytoca NOT DETECTED NOT DETECTED Final   Klebsiella pneumoniae NOT DETECTED NOT DETECTED Final   Proteus species NOT DETECTED NOT DETECTED Final   Salmonella species NOT DETECTED NOT DETECTED Final   Serratia marcescens NOT DETECTED NOT DETECTED Final   Haemophilus influenzae NOT DETECTED NOT DETECTED Final   Neisseria meningitidis NOT DETECTED NOT DETECTED Final   Pseudomonas aeruginosa NOT DETECTED NOT DETECTED Final   Stenotrophomonas maltophilia NOT DETECTED NOT DETECTED Final   Candida albicans NOT DETECTED NOT DETECTED Final   Candida auris NOT DETECTED NOT DETECTED Final   Candida glabrata NOT DETECTED NOT DETECTED Final   Candida krusei NOT DETECTED NOT DETECTED Final   Candida parapsilosis NOT DETECTED NOT DETECTED Final   Candida tropicalis NOT DETECTED NOT DETECTED Final   Cryptococcus neoformans/gattii NOT DETECTED NOT DETECTED Final    Comment: Performed at [facility], [address], [place], [place] [address]  Blood Culture (routine x 2)     Status: None   Collection Time: 04/12/23  1:10 PM   Specimen: BLOOD RIGHT HAND  Result Value Ref Range Status   Specimen Description   Final    BLOOD RIGHT HAND BOTTLES DRAWN AEROBIC AND ANAEROBIC Performed at [facility], [address] [place], [place] [address]    Special Requests   Final    Blood Culture results may not be optimal due to an inadequate volume of blood received in culture bottles Performed at [facility], [address], [place], [place] [address]    Culture   Final    NO GROWTH 5 DAYS Performed at [facility], [address], [place], [place] [address]    Report Status 04/17/2023  FINAL  Final  MRSA Next Gen by PCR, Nasal     Status: None   Collection Time: 04/13/23  1:51 AM   Specimen: Nasal Mucosa; Nasal Swab  Result Value Ref Range Status   MRSA by PCR Next Gen NOT DETECTED NOT DETECTED Final    Comment: (NOTE) The GeneXpert MRSA Assay (FDA approved for NASAL specimens only), is one component of a comprehensive MRSA colonization surveillance program. It is not intended to  diagnose MRSA infection nor to guide or monitor treatment for MRSA infections. Test performance is not FDA approved in patients less than 29 years old. Performed at [facility], [address] [place], [place] [address]     Labs: CBC: Recent Labs  Lab 04/12/23 1232 04/13/23 0708 04/14/23 0612 04/15/23 0507 04/16/23 1026 04/17/23 0609  WBC 13.7* 11.9* 13.8* 10.6* 10.9* 11.1*  NEUTROABS 9.6*  --   --  7.5 8.7* 6.6  HGB 12.5 11.6* 12.1 13.0 13.4 13.9  HCT 40.2 36.8 39.3 41.2 42.7 44.7  MCV 90.7 89.1 91.6 89.4 89.0 89.6  PLT 294 249 306 305 331 359   Basic Metabolic Panel: Recent Labs  Lab 04/12/23 1825 04/13/23 0708 04/14/23 0612 04/15/23 0507 04/16/23 1026 04/17/23 0609  NA  --  141 144 141 137 142  K  --  4.1 4.6 4.5 4.0 4.4  CL  --  108 110 104 101 104  CO2  --  21* 25 27 25  21*  GLUCOSE  --  117* 102* 94 130* 89  BUN  --  33* 37* 37* 34* 34*  CREATININE  --  1.42* 1.26* 1.40* 0.78 0.91  CALCIUM  --  8.5* 9.2 9.4 9.1 9.4  MG 2.1  --   --   --   --   --   PHOS 3.9  --   --   --   --   --    Liver Function Tests: Recent Labs  Lab 04/12/23 1232 04/13/23 0708  AST 27 19  ALT 20 18  ALKPHOS 94 78  BILITOT 0.5 0.5  PROT 7.9 6.6  ALBUMIN 3.8 3.0*   CBG: No results for input(s): "GLUCAP" in the last 168 hours.  Discharge time spent: greater than 30 minutes.  Signed: Ryne Mctigue, DO [facility] 04/17/2023

## 2023-04-17 NOTE — TOC Transition Note (Signed)
Transition of Care [facility]) - Discharge Note   Patient Details  Name: [name] MRN: [id number] Date of Birth: 1932/10/27  [facility]) CM/SW Contact:  Otelia Santee, LCSW Phone Number: 04/17/2023, 2:17 PM   Clinical Narrative:    Insurance approved for SNF. PT able to transfer to [facility]. Pt will be going to room 303-1. RN to call report to [phone]. DC packet placed at RN station. Spoke with pt's son to inform of discharge. Attempted to reach staff at [facility] x 3. Voicemail left. [facility] called at 2:17pm for transportation.    Final next level of care: Skilled Nursing Facility Barriers to Discharge: Barriers Resolved   Patient Goals and CMS Choice Patient states their goals for this hospitalization and ongoing recovery are:: For pt to go to SNF CMS Medicare.gov Compare Post Acute Care list provided to:: Patient Represenative (must comment) Choice offered to / list presented to : [facility] POA / Guardian, Adult Children      Discharge Placement   Existing PASRR number confirmed : 04/15/23          Patient chooses bed at: [facility] Patient to be transferred to facility by: [facility] Name of family member notified: Son Patient and family notified of of transfer: 04/17/23  Discharge Plan and Services Additional resources added to the After Visit Summary for   In-house Referral: Clinical Social Work Discharge Planning Services: NA Post Acute Care Choice: Skilled Nursing Facility          DME Arranged: N/A DME Agency: NA                  Social Drivers of Health (SDOH) Interventions SDOH Screenings   Food Insecurity: Patient Unable To Answer (04/12/2023)  Housing: Patient Unable To Answer (04/12/2023)  Transportation Needs: Patient Unable To Answer (04/12/2023)  Utilities: Patient Unable To Answer (04/12/2023)  Alcohol Screen: Low Risk  (07/22/2020)  Depression (PHQ2-9): Low Risk  (07/22/2020)  Financial Resource Strain: Low Risk   (07/22/2020)  Physical Activity: Sufficiently Active (07/22/2020)  Social Connections: Patient Unable To Answer (04/12/2023)  Stress: No Stress Concern Present (07/22/2020)  Tobacco Use: Low Risk  (04/12/2023)     Readmission Risk Interventions    04/15/2023   12:03 PM  Readmission Risk Prevention Plan  Transportation Screening Complete  PCP or Specialist Appt within 5-7 Days Complete  Home Care Screening Complete  Medication Review (RN CM) Complete

## 2023-04-17 NOTE — Plan of Care (Signed)
  Problem: Fluid Volume: Goal: Hemodynamic stability will improve Outcome: Progressing   Problem: Clinical Measurements: Goal: Diagnostic test results will improve Outcome: Progressing Goal: Signs and symptoms of infection will decrease Outcome: Progressing   Problem: Respiratory: Goal: Ability to maintain adequate ventilation will improve Outcome: Progressing   Problem: Clinical Measurements: Goal: Ability to maintain clinical measurements within normal limits will improve Outcome: Progressing Goal: Will remain free from infection Outcome: Progressing Goal: Respiratory complications will improve Outcome: Progressing Goal: Cardiovascular complication will be avoided Outcome: Progressing   Problem: Nutrition: Goal: Adequate nutrition will be maintained Outcome: Progressing   Problem: Coping: Goal: Level of anxiety will decrease Outcome: Progressing   Problem: Elimination: Goal: Will not experience complications related to bowel motility Outcome: Progressing   Problem: Pain Managment: Goal: General experience of comfort will improve and/or be controlled Outcome: Progressing   Problem: Safety: Goal: Ability to remain free from injury will improve Outcome: Progressing   Problem: Skin Integrity: Goal: Risk for impaired skin integrity will decrease Outcome: Progressing   Problem: Respiratory: Goal: Ability to maintain a clear airway will improve Outcome: Progressing Goal: Levels of oxygenation will improve Outcome: Progressing Goal: Ability to maintain adequate ventilation will improve Outcome: Progressing   Problem: Activity: Goal: Ability to tolerate increased activity will improve Outcome: Progressing   Problem: Respiratory: Goal: Ability to maintain adequate ventilation will improve Outcome: Progressing Goal: Ability to maintain a clear airway will improve Outcome: Progressing

## 2023-10-25 ENCOUNTER — Emergency Department (HOSPITAL_COMMUNITY)
Admission: EM | Admit: 2023-10-25 | Discharge: 2023-10-25 | Disposition: A | Discharge status: Other Acute Inpatient Hospital | Source: Skilled Nursing Facility | Attending: Emergency Medicine | Admitting: Emergency Medicine

## 2023-10-25 ENCOUNTER — Emergency Department (HOSPITAL_COMMUNITY)

## 2023-10-25 ENCOUNTER — Other Ambulatory Visit: Payer: Self-pay

## 2023-10-25 DIAGNOSIS — N189 Chronic kidney disease, unspecified: Secondary | ICD-10-CM | POA: Diagnosis not present

## 2023-10-25 DIAGNOSIS — W19XXXA Unspecified fall, initial encounter: Secondary | ICD-10-CM | POA: Insufficient documentation

## 2023-10-25 DIAGNOSIS — F0390 Unspecified dementia without behavioral disturbance: Secondary | ICD-10-CM | POA: Insufficient documentation

## 2023-10-25 DIAGNOSIS — Y92002 Bathroom of unspecified non-institutional (private) residence single-family (private) house as the place of occurrence of the external cause: Secondary | ICD-10-CM | POA: Insufficient documentation

## 2023-10-25 DIAGNOSIS — M549 Dorsalgia, unspecified: Secondary | ICD-10-CM | POA: Diagnosis present

## 2023-10-25 DIAGNOSIS — N390 Urinary tract infection, site not specified: Secondary | ICD-10-CM | POA: Diagnosis not present

## 2023-10-25 LAB — CBC WITH DIFFERENTIAL/PLATELET
Abs Immature Granulocytes: 0.07 K/uL (ref 0.00–0.07)
Basophils Absolute: 0 K/uL (ref 0.0–0.1)
Basophils Relative: 0 %
Eosinophils Absolute: 0.5 K/uL (ref 0.0–0.5)
Eosinophils Relative: 8 %
HCT: 41.4 % (ref 36.0–46.0)
Hemoglobin: 13.3 g/dL (ref 12.0–15.0)
Immature Granulocytes: 1 %
Lymphocytes Relative: 20 %
Lymphs Abs: 1.4 K/uL (ref 0.7–4.0)
MCH: 27.5 pg (ref 26.0–34.0)
MCHC: 32.1 g/dL (ref 30.0–36.0)
MCV: 85.7 fL (ref 80.0–100.0)
Monocytes Absolute: 0.8 K/uL (ref 0.1–1.0)
Monocytes Relative: 12 %
Neutro Abs: 4.2 K/uL (ref 1.7–7.7)
Neutrophils Relative %: 59 %
Platelets: 301 K/uL (ref 150–400)
RBC: 4.83 MIL/uL (ref 3.87–5.11)
RDW: 14.1 % (ref 11.5–15.5)
WBC: 7 K/uL (ref 4.0–10.5)
nRBC: 0 % (ref 0.0–0.2)

## 2023-10-25 LAB — COMPREHENSIVE METABOLIC PANEL WITH GFR
ALT: 24 U/L (ref 0–44)
AST: 22 U/L (ref 15–41)
Albumin: 3.7 g/dL (ref 3.5–5.0)
Alkaline Phosphatase: 102 U/L (ref 38–126)
Anion gap: 13 (ref 5–15)
BUN: 28 mg/dL — ABNORMAL HIGH (ref 8–23)
CO2: 25 mmol/L (ref 22–32)
Calcium: 9.6 mg/dL (ref 8.9–10.3)
Chloride: 103 mmol/L (ref 98–111)
Creatinine, Ser: 1.47 mg/dL — ABNORMAL HIGH (ref 0.44–1.00)
GFR, Estimated: 34 mL/min — ABNORMAL LOW (ref >60)
Glucose, Bld: 93 mg/dL (ref 70–99)
Potassium: 4.3 mmol/L (ref 3.5–5.1)
Sodium: 141 mmol/L (ref 135–145)
Total Bilirubin: 0.6 mg/dL (ref 0.0–1.2)
Total Protein: 6.8 g/dL (ref 6.5–8.1)

## 2023-10-25 LAB — URINALYSIS, ROUTINE W REFLEX MICROSCOPIC
Bilirubin Urine: NEGATIVE
Glucose, UA: NEGATIVE mg/dL
Hgb urine dipstick: NEGATIVE
Ketones, ur: NEGATIVE mg/dL
Nitrite: POSITIVE — AB
Protein, ur: 30 mg/dL — AB
Specific Gravity, Urine: 1.011 (ref 1.005–1.030)
pH: 7 (ref 5.0–8.0)

## 2023-10-25 LAB — TROPONIN I (HIGH SENSITIVITY)
Troponin I (High Sensitivity): 9 ng/L (ref <18)
Troponin I (High Sensitivity): 8 ng/L (ref <18)

## 2023-10-25 LAB — CK: Total CK: 101 U/L (ref 38–234)

## 2023-10-25 MED ORDER — LORAZEPAM 2 MG/ML IJ SOLN
1.0000 mg | INTRAMUSCULAR | Status: DC | PRN
  Administered 2023-10-25: 1 mg via INTRAVENOUS
  Filled 2023-10-25: qty 1

## 2023-10-25 MED ORDER — ACETAMINOPHEN 500 MG PO TABS
1000.0000 mg | ORAL_TABLET | Freq: Once | ORAL | Status: AC
  Administered 2023-10-25: 1000 mg via ORAL
  Filled 2023-10-25: qty 2

## 2023-10-25 MED ORDER — SODIUM CHLORIDE 0.9 % IV SOLN
1.0000 g | Freq: Once | INTRAVENOUS | Status: AC
Start: 2023-10-25 — End: 2023-10-25
  Administered 2023-10-25: 1 g via INTRAVENOUS
  Filled 2023-10-25: qty 10

## 2023-10-25 MED ORDER — SODIUM CHLORIDE 0.9 % IV BOLUS
1000.0000 mL | Freq: Once | INTRAVENOUS | Status: AC
  Administered 2023-10-25: 1000 mL via INTRAVENOUS

## 2023-10-25 MED ORDER — CEPHALEXIN 500 MG PO CAPS: 500.0000 mg | ORAL_CAPSULE | Freq: Four times a day (QID) | ORAL | 0 refills | Status: AC

## 2023-10-25 NOTE — ED Notes (Signed)
 I&O unsuccessful 2 attempts made, EDP aware. Purewick placed.

## 2023-10-25 NOTE — ED Notes (Signed)
 X-ray at bedside

## 2023-10-25 NOTE — Discharge Instructions (Signed)
 You have symptoms due to a urinary tract infection.  Please take antibiotic as prescribed for the full duration.  Follow-up with your doctor for further care.

## 2023-10-25 NOTE — ED Notes (Signed)
 Transported to [facility]

## 2023-10-25 NOTE — ED Notes (Signed)
 PTAR has been arranged for the patient to return to [place].

## 2023-10-25 NOTE — ED Provider Notes (Signed)
 [place] EMERGENCY DEPARTMENT AT [facility] Provider Note   CSN: [id number] Arrival date & time: 10/25/23  9447     Patient presents with: Fall   [name] is a 89 y.o. female.   Cr  The history is provided by the patient, the EMS personnel and medical records. No language interpreter was used.  Fall     89 year old female history of dementia, CKD, mitral valve prolapse, brought here via EMS from nursing facility for evaluation of a fall.  History is limited due to her history of dementia.  From triage note, the patient had an unwitnessed fall in the bathroom and she was down for an unknown amount of time.  According to staff, patient is oriented to self which is her baseline.  Patient was placed in a c-collar by EMS.  She is not on any blood thinner medication.  Currently patient is without any complaint.  Level 5 caveat's due to dementia.  Prior to Admission medications   Medication Sig Start Date End Date Taking? Authorizing Provider  acetaminophen  (TYLENOL ) 500 MG tablet Take 1,000 mg by mouth 3 (three) times daily.    [provider]  albuterol  (PROVENTIL ) (2.5 MG/3ML) 0.083% nebulizer solution Take 3 mLs (2.5 mg total) by nebulization every 4 (four) hours as needed for wheezing or shortness of breath. 04/17/23   Swayze, Ava, DO  amLODipine  (NORVASC ) 10 MG tablet TAKE 1 TABLET BY MOUTH EVERY DAY Patient taking differently: Take 10 mg by mouth daily. 05/15/21   Duanne Butler DASEN, MD  BAZA ANTIFUNGAL 2 % cream Apply 1 Application topically See admin instructions. Apply to the buttocks 3 times a day    [provider]  colestipol  (COLESTID ) 1 g tablet Take 1 g by mouth in the morning.    [provider]  docusate sodium  (COLACE) 100 MG capsule Take 100 mg by mouth every 12 (twelve) hours as needed (for constipation).    [provider]  donepezil  (ARICEPT ) 10 MG tablet TAKE 1 TABLET BY MOUTH EVERYDAY AT BEDTIME Patient taking  differently: Take 10 mg by mouth at bedtime. 07/06/21   Duanne Butler DASEN, MD  famotidine  (PEPCID ) 20 MG tablet Take 20 mg by mouth daily before breakfast.    [provider]  levothyroxine  (SYNTHROID ) 75 MCG tablet Take 75 mcg by mouth daily before breakfast.    [provider]  memantine  (NAMENDA ) 5 MG tablet Take 1 tablet (5 mg total) by mouth 2 (two) times daily. 04/17/23   Swayze, Ava, DO  metoprolol  tartrate (LOPRESSOR ) 25 MG tablet TAKE 1 TABLET BY MOUTH TWICE A DAY Patient taking differently: Take 25 mg by mouth in the morning and at bedtime. 06/15/21   Duanne Butler DASEN, MD  NON FORMULARY Take 4 fluid ounces by mouth See admin instructions. Magic Cup- Eat 4 fluid ounces (1 cup) by mouth three times a day with meals    [provider]  pantoprazole  (PROTONIX ) 40 MG tablet TAKE 1 TABLET BY MOUTH EVERY DAY Patient not taking: Reported on 04/12/2023 06/15/21   Duanne Butler DASEN, MD    Allergies: Tape and Demerol [meperidine]    Review of Systems  Unable to perform ROS: Dementia    Updated Vital Signs BP (!) 190/87   Pulse 75   Temp (!) 97.3 F (36.3 C) (Oral)   Resp 14   SpO2 97%   Physical Exam Vitals and nursing note reviewed.  Constitutional:      General: She is  not in acute distress.    Appearance: She is well-developed.     Comments: Elderly female laying bed in no acute discomfort.  She has a c-collar in place  HENT:     Head: Atraumatic.  Eyes:     Conjunctiva/sclera: Conjunctivae normal.     Comments: Right pupil 1 mm nonreactive, left pupil 3 mm and reactive.  Extraocular movements intact  Neck:     Comments: C-collar in place, no reproducible midline spine tenderness Cardiovascular:     Rate and Rhythm: Normal rate and regular rhythm.     Pulses: Normal pulses.     Heart sounds: Normal heart sounds.  Pulmonary:     Effort: Pulmonary effort is normal.     Breath sounds: No wheezing, rhonchi or rales.  Abdominal:     Palpations:  Abdomen is soft.  Genitourinary:    Comments: Chaperone present during exam.  Soiled adult briefs.  No sacral decubitus ulcer Musculoskeletal:     Cervical back: Neck supple.     Comments: Moving all 4 extremities and follows commands  Skin:    Findings: No rash.  Neurological:     Mental Status: She is alert.     GCS: GCS eye subscore is 4. GCS verbal subscore is 5. GCS motor subscore is 6.     Comments: Alert and oriented x 1  Psychiatric:        Mood and Affect: Mood normal.     (all labs ordered are listed, but only abnormal results are displayed) Labs Reviewed  COMPREHENSIVE METABOLIC PANEL WITH GFR - Abnormal; Notable for the following components:      Result Value   BUN 28 (*)    Creatinine, Ser 1.47 (*)    GFR, Estimated 34 (*)    All other components within normal limits  URINALYSIS, ROUTINE W REFLEX MICROSCOPIC - Abnormal; Notable for the following components:   APPearance HAZY (*)    Protein, ur 30 (*)    Nitrite POSITIVE (*)    Leukocytes,Ua MODERATE (*)    Bacteria, UA FEW (*)    All other components within normal limits  URINE CULTURE  CBC WITH DIFFERENTIAL/PLATELET  CK  TROPONIN I (HIGH SENSITIVITY)  TROPONIN I (HIGH SENSITIVITY)    EKG: EKG Interpretation Date/Time:  Friday October 25 2023 07:04:47 EDT Ventricular Rate:  73 PR Interval:  207 QRS Duration:  77 QT Interval:  413 QTC Calculation: 456 R Axis:   -4  Text Interpretation: Sinus rhythm Probable left atrial enlargement Abnormal inferior Q waves No significant change since last tracing Confirmed by Francesca Fallow ([id number]) on 10/25/2023 7:56:49 AM   Radiology: MR BRAIN WO CONTRAST Result Date: 10/25/2023 CLINICAL DATA:  Provided history: Neuro deficit, acute, stroke suspected. EXAM: MRI HEAD WITHOUT CONTRAST TECHNIQUE: Multiplanar, multiecho pulse sequences of the brain and surrounding structures were obtained without intravenous contrast. COMPARISON:  Head CT 10/25/2023. Brain MRI 09/08/2015.  FINDINGS: Brain: Moderate generalized cerebral atrophy. Multifocal T2 FLAIR hyperintense signal abnormality within the cerebral white matter, nonspecific but compatible with advanced chronic small vessel ischemic disease. Small T2 hyperintense foci within the bilateral deep gray nuclei, which appear to reflect a combination of prominent perivascular spaces and chronic lacunar infarcts. The chronic lacunar infarcts have increased in number since the MRI of 09/08/2015. Chronic infarct within the right aspect of the pons, new from the prior MRI. Background moderate pontine chronic small vessel ischemic disease. Small chronic infarcts within the bilateral cerebellar hemispheres, new from the prior MRI.  Left middle cranial fossa arachnoid cyst (overlying the anterior temporal lobe), slightly increased in size from the prior MRI. The arachnoid cyst now measures 4.0 x 4.0 cm in transaxial dimension (previously measuring 3.7 cm in greatest dimension). There is no acute infarct. No evidence of an intracranial mass. No midline shift. Vascular: Maintained flow voids within the proximal large arterial vessels. Skull and upper cervical spine: No focal worrisome marrow lesion. Sinuses/Orbits: No mass or acute finding within the imaged orbits. Bilateral maxillary sinusitis (severe right, moderate left). Moderate mucosal thickening within the right sphenoid sinus. Moderate to severe left sphenoid sinusitis. Bilateral ethmoid sinusitis (mild right, moderate left). Severe mucosal thickening within the left frontal sinus. Other: Trace fluid within right mastoid air cells. 9 x 3 mm left anterior scalp lipoma. IMPRESSION: 1.  No evidence of an acute intracranial abnormality. 2. Parenchymal atrophy, chronic small vessel ischemic disease and chronic infarcts, as described. 3. 4 cm left middle cranial fossa arachnoid cyst, slightly increased in size since the MRI of 10/25/2023. 4. Paranasal sinus disease as outlined. Correlate for  signs/symptoms of acute sinusitis. 5. 9 x 3 mm left anterior scalp lipoma. Electronically Signed   By: Rockey Childs D.O.   On: 10/25/2023 11:09   DG Chest Portable 1 View Result Date: 10/25/2023 EXAM: 1 VIEW XRAY OF THE CHEST 10/25/2023 07:21:00 AM COMPARISON: None available. CLINICAL HISTORY: Fall. Reason for the exam: fall; Triage notes: Coming from [place], Unwitnessed fall in the bathroom unkwn time down. Pt oriented to self. Baseline. VSS pt reports neck/back/buttock pain. Pt placed on c-collar by EMS. Neg thinners. FINDINGS: LUNGS AND PLEURA: No focal pulmonary opacity. No pulmonary edema. No pleural effusion. No pneumothorax. HEART AND MEDIASTINUM: No acute abnormality of the cardiac and mediastinal silhouettes. BONES AND SOFT TISSUES: No acute osseous abnormality. IMPRESSION: 1. No acute process. Electronically signed by: Lonni Necessary MD 10/25/2023 07:30 AM EDT RP Workstation: [id number]   CT Cervical Spine Wo Contrast Result Date: 10/25/2023 EXAM: CT CERVICAL SPINE WITHOUT CONTRAST 10/25/2023 06:33:22 AM TECHNIQUE: CT of the cervical spine was performed without the administration of intravenous contrast. Multiplanar reformatted images are provided for review. Automated exposure control, iterative reconstruction, and/or weight based adjustment of the mA/kV was utilized to reduce the radiation dose to as low as reasonably achievable. COMPARISON: None available. CLINICAL HISTORY: Neck trauma (Age >= 65y). fall FINDINGS: CERVICAL SPINE: BONES AND ALIGNMENT: Grade 1 degenerative anterolisthesis at C3-4 measures 2 mm. Slight anterolisthesis is present at C7-T1. DEGENERATIVE CHANGES: Chronic loss of disc height is present at C2-3, C4-5, C5-6 and C6-7. Uncovertebral spurring contributes to left greater than right foraminal narrowing at C4-5 and left foraminal narrowing at C5-6 and C6-7. SOFT TISSUES: No prevertebral soft tissue swelling. VASCULATURE: Atherosclerotic calcifications are present at the  aortic arch and carotid bifurcation bilaterally. PARANASAL SINUSES: Sphenoid and maxillary sinus disease concurrent with acute-on-chronic sinus disease is noted. IMPRESSION: 1. No acute abnormality of the cervical spine related to the reported neck trauma. 2. Grade 1 degenerative anterolisthesis at C3-4 and slight anterolisthesis at C7-T1. 3. Chronic loss of disc height at C2-3, C4-5, C5-6, and C6-7. 4. Uncovertebral spurring contributing to left greater than right foraminal narrowing at C4-5 and left foraminal narrowing at C5-6 and C6-7. Electronically signed by: Lonni Necessary MD 10/25/2023 06:41 AM EDT RP Workstation: [id number]   CT Head Wo Contrast Result Date: 10/25/2023 EXAM: CT HEAD WITHOUT CONTRAST 10/25/2023 06:33:22 AM TECHNIQUE: CT of the head was performed without the administration of intravenous contrast. Automated exposure control,  iterative reconstruction, and/or weight based adjustment of the mA/kV was utilized to reduce the radiation dose to as low as reasonably achievable. COMPARISON: CT head without contrast 04/03/2022 and 09/15/2021. CLINICAL HISTORY: Head trauma, minor (Age >= 65y). fall FINDINGS: BRAIN AND VENTRICLES: No acute hemorrhage. Gray-white differentiation is preserved. No hydrocephalus. No extra-axial collection. No mass effect or midline shift. Moderate atrophy and diffuse white matter changes are similar to prior exams. Remote lacunar infarcts are present in the inferior cerebellum bilaterally. ORBITS: Left lens replacement is present. Globes and orbits are otherwise within normal limits. SINUSES: Fluid levels are present within the maxillary and sphenoid sinuses bilaterally. Chronic wall thickening is present in the maxillary and sphenoid sinuses. Scattered secretions are present within the ethmoid air cells. The left frontal sinus is opacified. SOFT TISSUES AND SKULL: No acute soft tissue abnormality. No skull fracture. IMPRESSION: 1. No acute intracranial abnormality  related to the minor head trauma. 2. Moderate atrophy and diffuse white matter changes, similar to prior exams. 3. Remote lacunar infarcts in the inferior cerebellum bilaterally. 4. Sinus disease as described above. Electronically signed by: Lonni Necessary MD 10/25/2023 06:39 AM EDT RP Workstation: [id number]     Procedures   Medications Ordered in the ED  LORazepam  (ATIVAN ) injection 1 mg (1 mg Intravenous Given 10/25/23 1021)  acetaminophen  (TYLENOL ) tablet 1,000 mg (1,000 mg Oral Given 10/25/23 0723)  sodium chloride  0.9 % bolus 1,000 mL (0 mLs Intravenous Stopped 10/25/23 1004)  cefTRIAXone  (ROCEPHIN ) 1 g in sodium chloride  0.9 % 100 mL IVPB (0 g Intravenous Stopped 10/25/23 1005)                                    Medical Decision Making Amount and/or Complexity of Data Reviewed Labs: ordered. Radiology: ordered. ECG/medicine tests: ordered.  Risk OTC drugs. Prescription drug management.   BP (!) 190/87   Pulse 75   Temp (!) 97.3 F (36.3 C) (Oral)   Resp 14   SpO2 97%   40:81 AM   89 year old female history of dementia, CKD, mitral valve prolapse, brought here via EMS from nursing facility for evaluation of a fall.  History is limited due to her history of dementia.  From triage note, the patient had an unwitnessed fall in the bathroom and she was down for an unknown amount of time.  According to staff, patient is oriented to self which is her baseline.  Patient was placed in a c-collar by EMS.  She is not on any blood thinner medication.  Currently patient is without any complaint.  Level 5 caveat's due to dementia.  On exam no obvious signs of trauma noted.  Patient still has her c-collar on.  She is moving all 4 extremities.  She is alert and oriented x 1.  She does have a pinpoint pupil in her right eye and I am unsure if this is new however she does not exhibit any neglect or other strokelike symptom.  Her blood pressure is elevated at 190/87.  Will obtain head and  C-spine CT, labs ordered, will obtain EKG and check UA.  I have reached out to the staff at [facility] and spoke to one of the nurse that knows patient well.  She report the patient was last seen normal around 2 AM last night.  At baseline patient able to ambulate on her own but does have baseline dementia.  -Labs ordered, independently viewed  and interpreted by me.  Labs remarkable for Cr 1.47, slightly compared to prior but similar to her previous baseline.  IVF given.  Normal CK, doubt rhabdo.  Normal WBC.  UA currently pending.  -The patient was maintained on a cardiac monitor.  I personally viewed and interpreted the cardiac monitored which showed an underlying rhythm of: NSR -Imaging independently viewed and interpreted by me and I agree with radiologist's interpretation.  Result remarkable for CXR unremarkable, head and Cspine CT without acute changes.  -This patient presents to the ED for concern of fall, this involves an extensive number of treatment options, and is a complaint that carries with it a high risk of complications and morbidity.  The differential diagnosis includes mechanical fall, stroke, MI, anemia, hypovolemia, electrolytes derangement, infection -Co morbidities that complicate the patient evaluation includes dementia, ckd, mitral valve prolapse -Treatment includes IVF, tylenol  -Reevaluation of the patient after these medicines showed that the patient stayed the same -PCP office notes or outside notes reviewed -Discussion with attending Dr. Francesca -Escalation to admission/observation considered: patients feels much better, is comfortable with discharge, and will follow up with PCP -Prescription medication considered, patient comfortable with keflex  -Social Determinant of Health considered   8:32 AM Initially, right pupil was 1 mm and nonreactive, initial exam reassessment at this time patient appears a lot more alert and talking pupils are equal and  reactive.  UA with finding consistent with UTI likely causing her delirium.  Negative delta troponin.  Patient received Rocephin  at this time she appears more alert.  She is stable for discharge back to facility with Keflex .     Final diagnoses:  Lower urinary tract infectious disease    ED Discharge Orders          Ordered    cephALEXin  (KEFLEX ) 500 MG capsule  4 times daily        10/25/23 1321               Nivia Colon, PA-C 10/25/23 1323    Francesca Elsie CROME, MD 10/26/23 1352

## 2023-10-25 NOTE — ED Triage Notes (Addendum)
 Coming from [place], Unwitnessed fall in the bathroom unkwn time down. Pt oriented to self. Baseline. VSS pt reports neck/back/buttock pain. Pt placed on c-collar by EMS. Neg thinners

## 2023-10-28 LAB — URINE CULTURE: Culture: >=100000 — AB

## 2023-10-29 ENCOUNTER — Telehealth (HOSPITAL_BASED_OUTPATIENT_CLINIC_OR_DEPARTMENT_OTHER): Payer: Self-pay

## 2023-10-29 NOTE — Telephone Encounter (Signed)
 Post ED Visit - Positive Culture Follow-up  Culture report reviewed by antimicrobial stewardship pharmacist: [facility] [x]  [place], [place].D. []  Venetia Gully, Pharm.D., BCPS AQ-ID []  Garrel Crews, Pharm.D., BCPS []  Almarie Lunger, Pharm.D., BCPS []  [place], [address].D., BCPS, AAHIVP []  Rosaline Bihari, Pharm.D., BCPS, AAHIVP []  Vernell Meier, PharmD, BCPS []  Latanya Hint, PharmD, BCPS []  Donald Medley, PharmD, BCPS []  Rocky Bold, PharmD []  Dorothyann Alert, PharmD, BCPS []  Morene Babe, PharmD  [facility] []  Rosaline Edison, PharmD []  Romona Bliss, PharmD []  Dolphus Roller, PharmD []  Veva Seip, Rph []  Vernell Daunt) Leonce, PharmD []  Eva Allis, PharmD []  Rosaline Millet, PharmD []  Iantha Batch, PharmD []  Arvin Gauss, PharmD []  Wanda Hasting, PharmD []  Ronal Rav, PharmD []  Rocky Slade, PharmD []  Bard Jeans, PharmD   Positive urine culture Treated with Cephalexin , organism sensitive to the same and no further patient follow-up is required at this time.  Ruth Camelia Elbe 10/29/2023, 9:56 AM
